# Patient Record
Sex: Female | Born: 1950 | Race: White | Hispanic: No | Smoking: Former smoker
Health system: Southern US, Community
[De-identification: ages and names within clinical notes are randomized; demographics above are authoritative.]

## PROBLEM LIST (undated history)

## (undated) DIAGNOSIS — F419 Anxiety disorder, unspecified: Secondary | ICD-10-CM

## (undated) DIAGNOSIS — R112 Nausea with vomiting, unspecified: Secondary | ICD-10-CM

## (undated) DIAGNOSIS — Z87442 Personal history of urinary calculi: Secondary | ICD-10-CM

## (undated) DIAGNOSIS — K219 Gastro-esophageal reflux disease without esophagitis: Secondary | ICD-10-CM

## (undated) DIAGNOSIS — T8859XA Other complications of anesthesia, initial encounter: Secondary | ICD-10-CM

## (undated) DIAGNOSIS — Z972 Presence of dental prosthetic device (complete) (partial): Secondary | ICD-10-CM

## (undated) DIAGNOSIS — Z9889 Other specified postprocedural states: Secondary | ICD-10-CM

## (undated) HISTORY — PX: HEEL SPUR SURGERY: SHX665

## (undated) SURGERY — Surgical Case
Anesthesia: *Unknown

---

## 2006-04-01 HISTORY — PX: ACHILLES TENDON REPAIR: SUR1153

## 2007-02-26 ENCOUNTER — Ambulatory Visit: Payer: Self-pay | Admitting: Family Medicine

## 2007-03-26 ENCOUNTER — Emergency Department: Payer: Self-pay | Admitting: Emergency Medicine

## 2007-08-04 ENCOUNTER — Ambulatory Visit: Payer: Self-pay

## 2007-08-28 ENCOUNTER — Ambulatory Visit: Payer: Self-pay | Admitting: Unknown Physician Specialty

## 2007-10-31 ENCOUNTER — Emergency Department: Payer: Self-pay | Admitting: Emergency Medicine

## 2008-04-01 HISTORY — PX: BREAST CYST ASPIRATION: SHX578

## 2008-07-11 ENCOUNTER — Emergency Department: Payer: Self-pay | Admitting: Emergency Medicine

## 2008-08-11 ENCOUNTER — Emergency Department: Payer: Self-pay

## 2012-08-27 ENCOUNTER — Emergency Department: Payer: Self-pay | Admitting: Emergency Medicine

## 2013-10-12 DIAGNOSIS — F324 Major depressive disorder, single episode, in partial remission: Secondary | ICD-10-CM | POA: Insufficient documentation

## 2013-10-12 DIAGNOSIS — F329 Major depressive disorder, single episode, unspecified: Secondary | ICD-10-CM | POA: Insufficient documentation

## 2013-10-12 DIAGNOSIS — K219 Gastro-esophageal reflux disease without esophagitis: Secondary | ICD-10-CM | POA: Insufficient documentation

## 2014-01-12 DIAGNOSIS — N219 Calculus of lower urinary tract, unspecified: Secondary | ICD-10-CM | POA: Insufficient documentation

## 2014-01-13 ENCOUNTER — Emergency Department: Payer: Self-pay | Admitting: Emergency Medicine

## 2014-01-13 LAB — COMPREHENSIVE METABOLIC PANEL
ALBUMIN: 3.4 g/dL (ref 3.4–5.0)
ALT: 30 U/L
Alkaline Phosphatase: 86 U/L
Anion Gap: 7 (ref 7–16)
BUN: 15 mg/dL (ref 7–18)
Bilirubin,Total: 0.5 mg/dL (ref 0.2–1.0)
CHLORIDE: 111 mmol/L — AB (ref 98–107)
CREATININE: 0.87 mg/dL (ref 0.60–1.30)
Calcium, Total: 8.1 mg/dL — ABNORMAL LOW (ref 8.5–10.1)
Co2: 25 mmol/L (ref 21–32)
EGFR (African American): >60
Glucose: 88 mg/dL (ref 65–99)
Osmolality: 285 (ref 275–301)
Potassium: 4.4 mmol/L (ref 3.5–5.1)
SGOT(AST): 33 U/L (ref 15–37)
Sodium: 143 mmol/L (ref 136–145)
Total Protein: 7.6 g/dL (ref 6.4–8.2)

## 2014-01-13 LAB — CBC
HCT: 39.9 % (ref 35.0–47.0)
HGB: 13 g/dL (ref 12.0–16.0)
MCH: 30 pg (ref 26.0–34.0)
MCHC: 32.6 g/dL (ref 32.0–36.0)
MCV: 92 fL (ref 80–100)
Platelet: 235 10*3/uL (ref 150–440)
RBC: 4.34 10*6/uL (ref 3.80–5.20)
RDW: 13.6 % (ref 11.5–14.5)
WBC: 4.7 10*3/uL (ref 3.6–11.0)

## 2014-01-13 LAB — URINALYSIS, COMPLETE
BILIRUBIN, UR: NEGATIVE
Bacteria: NONE SEEN
GLUCOSE, UR: NEGATIVE mg/dL (ref 0–75)
KETONE: NEGATIVE
Nitrite: NEGATIVE
PH: 5 (ref 4.5–8.0)
Protein: NEGATIVE
Specific Gravity: 1.019 (ref 1.003–1.030)

## 2014-01-15 LAB — URINE CULTURE

## 2014-08-31 ENCOUNTER — Other Ambulatory Visit: Payer: Self-pay | Admitting: Internal Medicine

## 2014-08-31 DIAGNOSIS — Z1231 Encounter for screening mammogram for malignant neoplasm of breast: Secondary | ICD-10-CM

## 2014-09-12 ENCOUNTER — Ambulatory Visit: Payer: Self-pay | Attending: Internal Medicine

## 2015-03-14 ENCOUNTER — Ambulatory Visit: Payer: Self-pay

## 2015-03-22 ENCOUNTER — Ambulatory Visit
Admission: RE | Admit: 2015-03-22 | Discharge: 2015-03-22 | Disposition: A | Discharge status: Home / Self Care | Payer: No Typology Code available for payment source | Source: Ambulatory Visit | Attending: Internal Medicine | Admitting: Internal Medicine

## 2015-03-22 DIAGNOSIS — Z1231 Encounter for screening mammogram for malignant neoplasm of breast: Secondary | ICD-10-CM | POA: Insufficient documentation

## 2016-02-15 ENCOUNTER — Other Ambulatory Visit: Payer: Self-pay | Admitting: Physician Assistant

## 2016-02-15 DIAGNOSIS — Z1231 Encounter for screening mammogram for malignant neoplasm of breast: Secondary | ICD-10-CM

## 2016-04-23 ENCOUNTER — Ambulatory Visit: Payer: No Typology Code available for payment source | Attending: Physician Assistant

## 2019-06-17 LAB — HM MAMMOGRAPHY

## 2019-08-09 ENCOUNTER — Emergency Department
Admission: EM | Admit: 2019-08-09 | Discharge: 2019-08-09 | Disposition: A | Discharge status: Home / Self Care | Payer: Medicare Other | Attending: Student in an Organized Health Care Education/Training Program | Admitting: Student in an Organized Health Care Education/Training Program

## 2019-08-09 ENCOUNTER — Other Ambulatory Visit: Payer: Self-pay

## 2019-08-09 ENCOUNTER — Emergency Department: Payer: Medicare Other

## 2019-08-09 ENCOUNTER — Encounter: Payer: Self-pay | Admitting: Emergency Medicine

## 2019-08-09 DIAGNOSIS — Z853 Personal history of malignant neoplasm of breast: Secondary | ICD-10-CM | POA: Insufficient documentation

## 2019-08-09 DIAGNOSIS — Z87891 Personal history of nicotine dependence: Secondary | ICD-10-CM | POA: Insufficient documentation

## 2019-08-09 DIAGNOSIS — R1011 Right upper quadrant pain: Secondary | ICD-10-CM

## 2019-08-09 DIAGNOSIS — R1032 Left lower quadrant pain: Secondary | ICD-10-CM | POA: Insufficient documentation

## 2019-08-09 DIAGNOSIS — R1031 Right lower quadrant pain: Secondary | ICD-10-CM | POA: Diagnosis not present

## 2019-08-09 DIAGNOSIS — R109 Unspecified abdominal pain: Secondary | ICD-10-CM | POA: Diagnosis present

## 2019-08-09 HISTORY — DX: Anxiety disorder, unspecified: F41.9

## 2019-08-09 HISTORY — DX: Gastro-esophageal reflux disease without esophagitis: K21.9

## 2019-08-09 LAB — COMPREHENSIVE METABOLIC PANEL
ALT: 27 U/L (ref 0–44)
AST: 34 U/L (ref 15–41)
Albumin: 4.1 g/dL (ref 3.5–5.0)
Alkaline Phosphatase: 52 U/L (ref 38–126)
Anion gap: 8 (ref 5–15)
BUN: 14 mg/dL (ref 8–23)
CO2: 25 mmol/L (ref 22–32)
Calcium: 9.1 mg/dL (ref 8.9–10.3)
Chloride: 109 mmol/L (ref 98–111)
Creatinine, Ser: 0.91 mg/dL (ref 0.44–1.00)
GFR calc Af Amer: >60 mL/min (ref >60)
GFR calc non Af Amer: >60 mL/min (ref >60)
Glucose, Bld: 101 mg/dL — ABNORMAL HIGH (ref 70–99)
Potassium: 4.2 mmol/L (ref 3.5–5.1)
Sodium: 142 mmol/L (ref 135–145)
Total Bilirubin: 0.6 mg/dL (ref 0.3–1.2)
Total Protein: 7.3 g/dL (ref 6.5–8.1)

## 2019-08-09 LAB — URINALYSIS, COMPLETE (UACMP) WITH MICROSCOPIC
Bilirubin Urine: NEGATIVE
Glucose, UA: NEGATIVE mg/dL
Hgb urine dipstick: NEGATIVE
Ketones, ur: NEGATIVE mg/dL
Nitrite: NEGATIVE
Protein, ur: NEGATIVE mg/dL
Specific Gravity, Urine: 1.024 (ref 1.005–1.030)
pH: 5 (ref 5.0–8.0)

## 2019-08-09 LAB — CBC
HCT: 37.5 % (ref 36.0–46.0)
Hemoglobin: 12.2 g/dL (ref 12.0–15.0)
MCH: 30.3 pg (ref 26.0–34.0)
MCHC: 32.5 g/dL (ref 30.0–36.0)
MCV: 93.1 fL (ref 80.0–100.0)
Platelets: 155 10*3/uL (ref 150–400)
RBC: 4.03 MIL/uL (ref 3.87–5.11)
RDW: 12.8 % (ref 11.5–15.5)
WBC: 4.5 10*3/uL (ref 4.0–10.5)
nRBC: 0 % (ref 0.0–0.2)

## 2019-08-09 LAB — LIPASE, BLOOD: Lipase: 28 U/L (ref 11–51)

## 2019-08-09 MED ORDER — ONDANSETRON HCL 4 MG/2ML IJ SOLN
4.0000 mg | Freq: Once | INTRAMUSCULAR | Status: AC
  Administered 2019-08-09: 4 mg via INTRAVENOUS
  Filled 2019-08-09: qty 2

## 2019-08-09 MED ORDER — IOHEXOL 300 MG/ML  SOLN
100.0000 mL | Freq: Once | INTRAMUSCULAR | Status: AC | PRN
  Administered 2019-08-09: 100 mL via INTRAVENOUS
  Filled 2019-08-09: qty 100

## 2019-08-09 MED ORDER — MORPHINE SULFATE (PF) 4 MG/ML IV SOLN
4.0000 mg | INTRAVENOUS | Status: DC | PRN
  Administered 2019-08-09: 4 mg via INTRAVENOUS
  Filled 2019-08-09: qty 1

## 2019-08-09 MED ORDER — ONDANSETRON HCL 4 MG PO TABS: 4.0000 mg | ORAL_TABLET | Freq: Every day | ORAL | 0 refills | Status: DC | PRN

## 2019-08-09 MED ORDER — OXYCODONE-ACETAMINOPHEN 5-325 MG PO TABS
1.0000 | ORAL_TABLET | ORAL | Status: DC | PRN
  Administered 2019-08-09: 1 via ORAL
  Filled 2019-08-09: qty 1

## 2019-08-09 MED ORDER — HYDROCODONE-ACETAMINOPHEN 5-325 MG PO TABS: 1.0000 | ORAL_TABLET | Freq: Four times a day (QID) | ORAL | 0 refills | Status: DC | PRN

## 2019-08-09 MED ORDER — POLYETHYLENE GLYCOL 3350 17 G PO PACK
17.0000 g | PACK | Freq: Every day | ORAL | 0 refills | Status: DC
Start: 2019-08-09 — End: 2019-10-05

## 2019-08-09 MED ORDER — SODIUM CHLORIDE 0.9 % IV BOLUS
500.0000 mL | Freq: Once | INTRAVENOUS | Status: AC
  Administered 2019-08-09: 500 mL via INTRAVENOUS

## 2019-08-09 NOTE — ED Notes (Signed)
Pt pending [place]

## 2019-08-09 NOTE — ED Triage Notes (Signed)
Pt here for right lower back/flank pain that radiates around to right lower abdomen.  Mild dysuria. Sx X 1 week. Has had diarrhea but no vomiting. No fevers.  Unlabored. VSS.

## 2019-08-09 NOTE — ED Notes (Signed)
Patient discharged to home per MD order. Patient in stable condition, and deemed medically cleared by ED provider for discharge. Discharge instructions reviewed with patient/family using "Teach Back"; verbalized understanding of medication education and administration, and information about follow-up care. Denies further concerns. ° °

## 2019-08-09 NOTE — ED Provider Notes (Signed)
-----------------------------------------   10:30 PM on 08/09/2019 -----------------------------------------  Ultrasound is normal.  Given normal LFTs with a reassuring ultrasound I believe the patient is safe for discharge home.  Patient agreeable to plan of care.   Harvest Dark, MD 08/09/19 2231

## 2019-08-09 NOTE — ED Provider Notes (Signed)
[facility] Emergency Department Provider Note    First MD Initiated Contact with Patient 08/09/19 2011     (approximate)  I have reviewed the triage vital signs and the nursing notes.   HISTORY  Chief Complaint Abdominal Pain    HPI Claudia Gutierrez is a 69 y.o. female with the below listed past medical history presents to the ER for 1 week of progressively worsening right-sided abdominal pain.  Has had some nausea and chills with this but no measured fevers.  Does still have her appendix.  States that pain initially started in the right flank is now wrapping around into the right groin.  Denies any dysuria.  Has never had pain like this before.  Does have a history of kidney stones but states that this pain has been more consistent.  Her father has a history of diverticulitis.  She has never been diagnosed with.    Past Medical History:  Diagnosis Date  . Acid reflux   . Anxiety    Family History  Problem Relation Age of Onset  . Breast cancer Sister 66       and stomach ca   Past Surgical History:  Procedure Laterality Date  . BREAST CYST ASPIRATION Right 2010   There are no problems to display for this patient.     Prior to Admission medications   Medication Sig Start Date End Date Taking? Authorizing Provider  HYDROcodone-acetaminophen (NORCO) 5-325 MG tablet Take 1 tablet by mouth every 6 (six) hours as needed for severe pain. 08/09/19   Merlyn Lot, MD  ondansetron (ZOFRAN) 4 MG tablet Take 1 tablet (4 mg total) by mouth daily as needed. 08/09/19 08/08/20  Merlyn Lot, MD  polyethylene glycol (MIRALAX / GLYCOLAX) 17 g packet Take 17 g by mouth daily. Mix one tablespoon with 8oz of your favorite juice or water every day until you are having soft formed stools. Then start taking once daily if you didn't have a stool the day before. 08/09/19   Merlyn Lot, MD    Allergies Codeine    Social History Social History    Tobacco Use  . Smoking status: Former Research scientist (life sciences)  . Smokeless tobacco: Never Used  Substance Use Topics  . Alcohol use: Yes  . Drug use: Never    Review of Systems Patient denies headaches, rhinorrhea, blurry vision, numbness, shortness of breath, chest pain, edema, cough, abdominal pain, nausea, vomiting, diarrhea, dysuria, fevers, rashes or hallucinations unless otherwise stated above in HPI. ____________________________________________   PHYSICAL EXAM:  VITAL SIGNS: Vitals:   08/09/19 1846 08/09/19 2043  BP:  (!) 152/79  Pulse:  65  Resp:  20  Temp:    SpO2: 94% 99%    Constitutional: Alert and oriented.  Eyes: Conjunctivae are normal.  Head: Atraumatic. Nose: No congestion/rhinnorhea. Mouth/Throat: Mucous membranes are moist.   Neck: No stridor. Painless ROM.  Cardiovascular: Normal rate, regular rhythm. Grossly normal heart sounds.  Good peripheral circulation. Respiratory: Normal respiratory effort.  No retractions. Lungs CTAB. Gastrointestinal: Soft with mild ttp in rlq, no guarding or rebound. No distention. No abdominal bruits. No CVA tenderness. Genitourinary:  Musculoskeletal: No lower extremity tenderness nor edema.  No joint effusions. Neurologic:  Normal speech and language. No gross focal neurologic deficits are appreciated. No facial droop Skin:  Skin is warm, dry and intact. No rash noted. Psychiatric: Mood and affect are normal. Speech and behavior are normal.  ____________________________________________   LABS (all labs ordered are listed, but  only abnormal results are displayed)  Results for orders placed or performed during the hospital encounter of 08/09/19 (from the past 24 hour(s))  Lipase, blood     Status: None   Collection Time: 08/09/19  6:44 PM  Result Value Ref Range   Lipase 28 11 - 51 U/L  Comprehensive metabolic panel     Status: Abnormal   Collection Time: 08/09/19  6:44 PM  Result Value Ref Range   Sodium 142 135 - 145 mmol/L    Potassium 4.2 3.5 - 5.1 mmol/L   Chloride 109 98 - 111 mmol/L   CO2 25 22 - 32 mmol/L   Glucose, Bld 101 (H) 70 - 99 mg/dL   BUN 14 8 - 23 mg/dL   Creatinine, Ser 0.91 0.44 - 1.00 mg/dL   Calcium 9.1 8.9 - 10.3 mg/dL   Total Protein 7.3 6.5 - 8.1 g/dL   Albumin 4.1 3.5 - 5.0 g/dL   AST 34 15 - 41 U/L   ALT 27 0 - 44 U/L   Alkaline Phosphatase 52 38 - 126 U/L   Total Bilirubin 0.6 0.3 - 1.2 mg/dL   GFR calc non Af Amer >60 >60 mL/min   GFR calc Af Amer >60 >60 mL/min   Anion gap 8 5 - 15  CBC     Status: None   Collection Time: 08/09/19  6:44 PM  Result Value Ref Range   WBC 4.5 4.0 - 10.5 K/uL   RBC 4.03 3.87 - 5.11 MIL/uL   Hemoglobin 12.2 12.0 - 15.0 g/dL   HCT 37.5 36.0 - 46.0 %   MCV 93.1 80.0 - 100.0 fL   MCH 30.3 26.0 - 34.0 pg   MCHC 32.5 30.0 - 36.0 g/dL   RDW 12.8 11.5 - 15.5 %   Platelets 155 150 - 400 K/uL   nRBC 0.0 0.0 - 0.2 %  Urinalysis, Complete w Microscopic     Status: Abnormal   Collection Time: 08/09/19  6:44 PM  Result Value Ref Range   Color, Urine YELLOW (A) YELLOW   APPearance CLOUDY (A) CLEAR   Specific Gravity, Urine 1.024 1.005 - 1.030   pH 5.0 5.0 - 8.0   Glucose, UA NEGATIVE NEGATIVE mg/dL   Hgb urine dipstick NEGATIVE NEGATIVE   Bilirubin Urine NEGATIVE NEGATIVE   Ketones, ur NEGATIVE NEGATIVE mg/dL   Protein, ur NEGATIVE NEGATIVE mg/dL   Nitrite NEGATIVE NEGATIVE   Leukocytes,Ua SMALL (A) NEGATIVE   RBC / HPF 0-5 0 - 5 RBC/hpf   WBC, UA 6-10 0 - 5 WBC/hpf   Bacteria, UA RARE (A) NONE SEEN   Squamous Epithelial / LPF 21-50 0 - 5   Mucus PRESENT    Hyaline Casts, UA PRESENT    ____________________________________________ ____________________________________________  RADIOLOGY  I personally reviewed all radiographic images ordered to evaluate for the above acute complaints and reviewed radiology reports and findings.  These findings were personally discussed with the patient.  Please see medical record for radiology  report.  ____________________________________________   PROCEDURES  Procedure(s) performed:  Procedures    Critical Care performed: no ____________________________________________   INITIAL IMPRESSION / ASSESSMENT AND PLAN / ED COURSE  Pertinent labs & imaging results that were available during my care of the patient were reviewed by me and considered in my medical decision making (see chart for details).   DDX: Appendicitis, diverticulitis, colitis, enteritis, cholelithiasis, pancreatitis, mass, SBO, musculoskeletal strain, shingles  ROONEY HARRALSON is a 69 y.o. who presents to the  ED with symptoms as described above.  Patient nontoxic-appearing but does have pain on exam of the abdomen no signs of shingles or overlying rash.  Does not have palpable hernia.  Buttock is reassuring without any evidence of transaminitis or biliary elevation.  No sign of leukocytosis.  Denies any dysuria.  Will order CT to evaluate for the above differential.  Clinical Course as of Aug 08 2125  Mon Aug 09, 2019  2123 Patient reassessed.  Discussed results of CT imaging with patient.  Does have some mild discomfort in the right upper quadrant though it seems predominantly in the right lower.  Will order ultrasound.  I anticipate patient will be appropriate for discharge home with pain medication and outpatient follow-up assuming that there is no evidence of acute cholecystitis as her blood work is otherwise reassuring.  No sign of pancreatic mass on CT no evidence of acute appendicitis or diverticulitis.   [PR]    Clinical Course User Index [PR] Merlyn Lot, MD    The patient was evaluated in Emergency Department today for the symptoms described in the history of present illness. He/she was evaluated in the context of the global COVID-19 pandemic, which necessitated consideration that the patient might be at risk for infection with the SARS-CoV-2 virus that causes COVID-19. Institutional protocols  and algorithms that pertain to the evaluation of patients at risk for COVID-19 are in a state of rapid change based on information released by regulatory bodies including the CDC and federal and state organizations. These policies and algorithms were followed during the patient's care in the ED.  As part of my medical decision making, I reviewed the following data within the [facility] notes reviewed and incorporated, Labs reviewed, notes from prior ED visits and [place] Controlled Substance Database   ____________________________________________   FINAL CLINICAL IMPRESSION(S) / ED DIAGNOSES  Final diagnoses:  RUQ abdominal pain  Right lower quadrant abdominal pain      NEW MEDICATIONS STARTED DURING THIS VISIT:  New Prescriptions   HYDROCODONE-ACETAMINOPHEN (NORCO) 5-325 MG TABLET    Take 1 tablet by mouth every 6 (six) hours as needed for severe pain.   ONDANSETRON (ZOFRAN) 4 MG TABLET    Take 1 tablet (4 mg total) by mouth daily as needed.   POLYETHYLENE GLYCOL (MIRALAX / GLYCOLAX) 17 G PACKET    Take 17 g by mouth daily. Mix one tablespoon with 8oz of your favorite juice or water every day until you are having soft formed stools. Then start taking once daily if you didn't have a stool the day before.     Note:  This document was prepared using Dragon voice recognition software and may include unintentional dictation errors.    Merlyn Lot, MD 08/09/19 2128

## 2019-08-09 NOTE — Discharge Instructions (Addendum)

## 2019-08-11 ENCOUNTER — Encounter: Payer: Self-pay | Admitting: Gastroenterology

## 2019-08-11 ENCOUNTER — Other Ambulatory Visit: Payer: Self-pay

## 2019-08-11 ENCOUNTER — Ambulatory Visit (INDEPENDENT_AMBULATORY_CARE_PROVIDER_SITE_OTHER): Payer: Medicare Other | Admitting: Gastroenterology

## 2019-08-11 VITALS — BP 143/67 | HR 62 | Temp 97.8°F | Ht 65.0 in | Wt 155.0 lb

## 2019-08-11 DIAGNOSIS — R933 Abnormal findings on diagnostic imaging of other parts of digestive tract: Secondary | ICD-10-CM

## 2019-08-11 DIAGNOSIS — R197 Diarrhea, unspecified: Secondary | ICD-10-CM

## 2019-08-11 DIAGNOSIS — M545 Low back pain, unspecified: Secondary | ICD-10-CM

## 2019-08-11 DIAGNOSIS — R935 Abnormal findings on diagnostic imaging of other abdominal regions, including retroperitoneum: Secondary | ICD-10-CM | POA: Diagnosis not present

## 2019-08-11 NOTE — Progress Notes (Signed)
Jonathon Bellows MD, MRCP(U.K) [address]  [place]  [place], [place] [address]  Main: [phone]  Fax: [phone]   Gastroenterology Consultation  Referring Provider:     [facility] Physician:  [facility] Primary Gastroenterologist:  Dr. Jonathon Bellows  Reason for Consultation:     ED follow-up        HPI:   Claudia Gutierrez is a 68 y.o. y/o female here to see me for an ED follow-up.  I reviewed records and it suggest that the patient already has an established gastroenterologist at [facility].  Last seen in October 2020.  Carries a diagnosis of IBS diarrhea.  Been established with them since November 2018.  History of GERD and intermittent abdominal pain.  EGD and colonoscopy in January 2019 were normal but showed a hiatal hernia..  Esophageal and gastric biopsies were normal.  At her last visit with her gastroenterologist had intermittent diarrhea and bloating for over a year.  She has been taking BC powders once daily for headaches.  Has had a gain in weight. At that time she was given a trial of Xifaxan for 14 days.  On 08/09/2019 presented to the emergency room at [facility] with abdominal pain.  Ongoing for over a week.  She has been on hydrocodone.She underwent a CT scan of the abdomen which demonstrated mild prominence of the common bile duct and proximal pancreatic duct.Subsequently a right upper quadrant ultrasound demonstrated a common bile duct diameter of 5.6 mm.  CMP showed no abnormalities in her transaminases or total bilirubin.  Hemoglobin is 12.2 g with an MCV of 93.1.  Urine analysis showed rare bacteria.  She states that she has been having back pain ongoing for a few weeks.  Points to the lower back on the right side.  Radiates to the front of her abdomen particularly to the groin.  Worse when she moves and better when she lays still.  Hydrocodone has been helping it.  Certain positions make it worse.   Has been taking ibuprofen daily for a short period of time.  She says be been having diarrhea for the past 2 weeks.  Occasional blood in the stool.  Past Medical History:  Diagnosis Date  . Acid reflux   . Anxiety     Past Surgical History:  Procedure Laterality Date  . BREAST CYST ASPIRATION Right 2010    Prior to Admission medications   Medication Sig Start Date End Date Taking? Authorizing Provider  HYDROcodone-acetaminophen (NORCO) 5-325 MG tablet Take 1 tablet by mouth every 6 (six) hours as needed for severe pain. 08/09/19   Merlyn Lot, MD  ondansetron (ZOFRAN) 4 MG tablet Take 1 tablet (4 mg total) by mouth daily as needed. 08/09/19 08/08/20  Merlyn Lot, MD  polyethylene glycol (MIRALAX / GLYCOLAX) 17 g packet Take 17 g by mouth daily. Mix one tablespoon with 8oz of your favorite juice or water every day until you are having soft formed stools. Then start taking once daily if you didn't have a stool the day before. 08/09/19   Merlyn Lot, MD    Family History  Problem Relation Age of Onset  . Breast cancer Sister 9       and stomach ca     Social History   Tobacco Use  . Smoking status: Former Research scientist (life sciences)  . Smokeless tobacco: Never Used  Substance Use Topics  . Alcohol use: Yes  . Drug use:  Never    Allergies as of 08/11/2019 - Review Complete 08/09/2019  Allergen Reaction Noted  . Codeine Nausea And Vomiting 08/09/2019    Review of Systems:    All systems reviewed and negative except where noted in HPI.   Physical Exam:  There were no vitals taken for this visit. No LMP recorded. Patient is postmenopausal. Psych:  Alert and cooperative. Normal mood and affect. General:   Alert,  Well-developed, well-nourished, pleasant and cooperative in NAD Head:  Normocephalic and atraumatic. Eyes:  Sclera clear, no icterus.   Conjunctiva pink. Ears:  Normal auditory acuity. Lungs:  Respirations even and unlabored.  Clear throughout to auscultation.   No  wheezes, crackles, or rhonchi. No acute distress. Heart:  Regular rate and rhythm; no murmurs, clicks, rubs, or gallops. Abdomen:  Normal bowel sounds.  No bruits.  Soft, non-tender and non-distended without masses, hepatosplenomegaly or hernias noted.  No guarding or rebound tenderness.    Msk: Reproducible tenderness in the right lower paraspinal muscle area.  Tenderness of the right iliac crest in the posterior aspect. Neurologic:  Alert and oriented x3;  grossly normal neurologically. Skin:  Intact without significant lesions or rashes. No jaundice. Lymph Nodes:  No significant cervical adenopathy. Psych:  Alert and cooperative. Normal mood and affect.  Imaging Studies: CT ABDOMEN PELVIS W CONTRAST  Result Date: 08/09/2019 CLINICAL DATA:  Right lower quadrant abdominal pain EXAM: CT ABDOMEN AND PELVIS WITH CONTRAST TECHNIQUE: Multidetector CT imaging of the abdomen and pelvis was performed using the standard protocol following bolus administration of intravenous contrast. CONTRAST:  149mL OMNIPAQUE IOHEXOL 300 MG/ML  SOLN COMPARISON:  None. FINDINGS: Lower chest: The visualized heart size within normal limits. No pericardial fluid/thickening. No hiatal hernia. The visualized portions of the lungs are clear. Hepatobiliary: The liver is normal in density without focal abnormality.The main portal vein is patent. No calcified gallstones are seen. There does however appear to be mild prominence of the common bile duct measuring up to 7 mm and the proximal pancreatic duct measuring up to 4 mm in dimension. Pancreas: Tiny calcifications are seen within or adjacent to the pancreatic head and midbody. No definite surrounding fat stranding changes are noted. Spleen: Normal in size without focal abnormality. Adrenals/Urinary Tract: Both adrenal glands appear normal. The kidneys and collecting system appear normal without evidence of urinary tract calculus or hydronephrosis. The bladder is partially decompressed  with apparent mild wall thickening. Stomach/Bowel: The stomach, small bowel, and colon are normal in appearance. No inflammatory changes, wall thickening, or obstructive findings.Scattered colonic diverticula are seen without diverticulitis. The appendix is unremarkable. Vascular/Lymphatic: There are no enlarged mesenteric, retroperitoneal, or pelvic lymph nodes. Scattered mild aortic atherosclerosis is noted. Reproductive: The uterus and adnexa are unremarkable. Other: Small fat containing anterior umbilical hernia noted. Musculoskeletal: No acute or significant osseous findings. There is a chronic slight anterior wedge compression deformity of the T11 vertebral body with endplate irregularity noted. IMPRESSION: 1. No cholelithiasis, however there is mild prominence of the common bile duct and proximal pancreatic duct which is nonspecific. 2. Normal appendix 3. Mild aortic Atherosclerosis (ICD10-I70.0). Electronically Signed   By: Prudencio Pair M.D.   On: 08/09/2019 21:10   US ABDOMEN LIMITED RUQ  Result Date: 08/09/2019 CLINICAL DATA:  Right upper quadrant abdominal pain x1 week EXAM: ULTRASOUND ABDOMEN LIMITED RIGHT UPPER QUADRANT COMPARISON:  CT from same day FINDINGS: Gallbladder: No gallstones or wall thickening visualized. No sonographic Murphy sign noted by sonographer. Common bile duct: Diameter: 5.6 mm  Liver: No focal lesion identified. Within normal limits in parenchymal echogenicity. Portal vein is patent on color Doppler imaging with normal direction of blood flow towards the liver. Other: None. IMPRESSION: Normal study. If there is clinical concern for an obstructing process based off the patient's recent CT and laboratory studies, consider further evaluation with MRCP. Electronically Signed   By: Constance Holster M.D.   On: 08/09/2019 22:25    Assessment and Plan:   ATIYA ILIFF is a 69 y.o. y/o female here to see me as an ER follow-up.  Carries a prior diagnosis of IBS-D and used to be  managed at [facility].  He had to transfer care to [place].  Presently she has back pain which based on her history and examination suggestive of musculoskeletal pain.  I suggested she be seen by physical therapy.  She is due to set up an appointment with her primary care physician under [facility] health.  I suggested her to avoid excess ibuprofen and substituted with Tylenol.  I do not believe she has abdominal pain.  This is a back pain.  She also has by diarrhea ongoing for over 2 weeks.  I will rule out infection with stool tests and fecal calprotectin, celiac serology.  If negative will need a colonoscopy to rule out microscopic colitis which has not been ruled out in the past.  I will also order an MRCP as she had biliary and pancreatic ductal dilation on her last CT scan.  No abnormal LFTs.  I will see her back in 2 weeks.  Follow up in 2 to 3 weeks.  Dr Jonathon Bellows MD,MRCP(U.K)

## 2019-08-13 ENCOUNTER — Encounter: Payer: Self-pay | Admitting: Gastroenterology

## 2019-08-13 LAB — CELIAC DISEASE AB SCREEN W/RFX
Antigliadin Abs, IgA: 5 units (ref 0–19)
IgA/Immunoglobulin A, Serum: 141 mg/dL (ref 87–352)
Transglutaminase IgA: <2 U/mL (ref 0–3)

## 2019-08-17 LAB — GI PROFILE, STOOL, PCR

## 2019-08-17 LAB — C DIFFICILE, CYTOTOXIN B

## 2019-08-17 LAB — CALPROTECTIN, FECAL: Calprotectin, Fecal: 57 ug/g (ref 0–120)

## 2019-08-17 LAB — C DIFFICILE TOXINS A+B W/RFLX: C difficile Toxins A+B, EIA: NEGATIVE

## 2019-08-24 ENCOUNTER — Other Ambulatory Visit: Payer: Self-pay

## 2019-08-24 ENCOUNTER — Telehealth: Payer: Self-pay

## 2019-08-24 ENCOUNTER — Telehealth: Payer: Self-pay | Admitting: Gastroenterology

## 2019-08-24 DIAGNOSIS — R197 Diarrhea, unspecified: Secondary | ICD-10-CM

## 2019-08-24 MED ORDER — NA SULFATE-K SULFATE-MG SULF 17.5-3.13-1.6 GM/177ML PO SOLN
354.0000 mL | Freq: Once | ORAL | 0 refills | Status: AC
Start: 2019-08-24 — End: 2019-08-24

## 2019-08-24 MED ORDER — ALPRAZOLAM 0.5 MG PO TABS: ORAL_TABLET | ORAL | 0 refills | Status: DC

## 2019-08-24 NOTE — Telephone Encounter (Signed)
I do not prescribe hydrocodone fro musculoskeletal pains. Would need to contact pcp if needed or pain management

## 2019-08-24 NOTE — Telephone Encounter (Signed)
Patient verbalized understanding  

## 2019-08-24 NOTE — Telephone Encounter (Signed)
-----   Message from Jonathon Bellows, MD sent at 08/23/2019  2:32 PM EDT ----- Inform stool tests are completely negative for infection and inflammation.  If still having diarrhea should proceed with colonoscopy.

## 2019-08-24 NOTE — Telephone Encounter (Signed)
Hydrocodone  [facility] [place]

## 2019-08-24 NOTE — Telephone Encounter (Signed)
Patient verbalized understanding. Scheduled patient for colonoscopy on 09/07/2019. Mailed instructions

## 2019-08-24 NOTE — Telephone Encounter (Signed)
Patient states the hospital gave her hydrocodone for the RUQ pain. Patient would like a refill on it

## 2019-08-26 ENCOUNTER — Other Ambulatory Visit: Payer: Self-pay

## 2019-08-26 ENCOUNTER — Other Ambulatory Visit: Payer: Self-pay | Admitting: Gastroenterology

## 2019-08-26 ENCOUNTER — Ambulatory Visit
Admission: RE | Admit: 2019-08-26 | Discharge: 2019-08-26 | Disposition: A | Discharge status: Home / Self Care | Payer: Medicare Other | Source: Ambulatory Visit | Attending: Gastroenterology | Admitting: Gastroenterology

## 2019-08-26 DIAGNOSIS — R933 Abnormal findings on diagnostic imaging of other parts of digestive tract: Secondary | ICD-10-CM

## 2019-08-26 MED ORDER — GADOBUTROL 1 MMOL/ML IV SOLN
7.0000 mL | Freq: Once | INTRAVENOUS | Status: AC | PRN
  Administered 2019-08-26: 7 mL via INTRAVENOUS

## 2019-09-02 ENCOUNTER — Telehealth (INDEPENDENT_AMBULATORY_CARE_PROVIDER_SITE_OTHER): Payer: Medicare Other | Admitting: Gastroenterology

## 2019-09-02 ENCOUNTER — Telehealth: Payer: Self-pay

## 2019-09-02 DIAGNOSIS — R935 Abnormal findings on diagnostic imaging of other abdominal regions, including retroperitoneum: Secondary | ICD-10-CM | POA: Diagnosis not present

## 2019-09-02 DIAGNOSIS — R197 Diarrhea, unspecified: Secondary | ICD-10-CM | POA: Diagnosis not present

## 2019-09-02 NOTE — Progress Notes (Signed)
Claudia Gutierrez , MD [address]  [place], [place] [address]  Main: [phone]  Fax: [phone]   Primary Care Physician: [facility]  Virtual Visit via Video Note  I connected with patient on 09/02/19 at 11:00 AM EDT by video and verified that I am speaking with the correct person using two identifiers.   I discussed the limitations, risks, security and privacy concerns of performing an evaluation and management service by video  and the availability of in person appointments. I also discussed with the patient that there may be a patient responsible charge related to this service. The patient expressed understanding and agreed to proceed.  Location of Patient: Home Location of Provider: Home Persons involved: Patient and provider only   History of Present Illness:  Discuss results of recent MRI- planned video visit but later changed to telephone visit   HPI: Claudia Gutierrez is a 69 y.o. female  Summary of history :  Initially referred and seen back in 07/2019 for diarrhea. She has previously been established at [place] since November 2018.Marland Kitchen Last seen in October 2020.  Carries a diagnosis of IBS diarrhea,GERD and intermittent abdominal pain.  History of  EGD and colonoscopy in January 2019 were normal but showed a hiatal hernia..  Esophageal and gastric biopsies normal.  At her last visit with her gastroenterologist had intermittent diarrhea and bloating for over a year.  She has been taking BC powders once daily for headaches.  Has had a gain in weight.At that time she was given a trial of Xifaxan for 14 days.  08/09/2019 presented to the emergency room at [facility] with abdominal pain.  for over a week.  She has been on hydrocodone.She underwent a CT scan of the abdomen which demonstrated mild prominence of the common bile duct and proximal pancreatic duct.Subsequently a right upper quadrant ultrasound demonstrated a  common bile duct diameter of 5.6 mm.  CMP showed no abnormalities in her transaminases or total bilirubin.  Hemoglobin is 12.2 g with an MCV of 93.1.  Urine analysis showed rare bacteria.  She states that she has been having back pain ongoing for a few weeks.  Points to the lower back on the right side.  Radiates to the front of her abdomen particularly to the groin.  Worse when she moves and better when she lays still.  Hydrocodone has been helping it.  Certain positions make it worse.  Has been taking ibuprofen daily for a short period of time.  She says be been having diarrhea for the past 2 weeks.  Occasional blood in the stool.  Interval history  08/11/2019-09/02/2019  08/12/2019: C diff toxin , GI PCR, celiac serology and fecal calprotectin- negative 08/26/2019: MRCP: Cystic lesion near the neck of the pancreas with question of dependent nodule versus small amount of dependently layering hemorrhagic or proteinaceous material.  Mild pancreatic ductal distension in the head of the pancreas.This is not as pronounced as suggested on the CT evaluation.Findings could be related to prior inflammation but appear improved. Adenomyomatosis of the gallbladder fundus.   Discussed with Dr Rush Landmark and felt that EUS would be beneficial to evaluate the cystic lesion.  Still has some diarrhea and back pain but not as bad, stopped nsaid use   Current Outpatient Medications  Medication Sig Dispense Refill  . ALPRAZolam (XANAX) 0.5 MG tablet Take 0.5mg  60 mins prior to MRI 1 tablet 0  . citalopram (CELEXA) 20  MG tablet Take 1 tablet by mouth daily.    Marland Kitchen HYDROcodone-acetaminophen (NORCO) 5-325 MG tablet Take 1 tablet by mouth every 6 (six) hours as needed for severe pain. 6 tablet 0  . omeprazole (PRILOSEC) 20 MG capsule Take 20 mg by mouth daily.    . ondansetron (ZOFRAN) 4 MG tablet Take 1 tablet (4 mg total) by mouth daily as needed. 14 tablet 0  . polyethylene glycol (MIRALAX / GLYCOLAX) 17 g packet Take 17  g by mouth daily. Mix one tablespoon with 8oz of your favorite juice or water every day until you are having soft formed stools. Then start taking once daily if you didn't have a stool the day before. 30 each 0  . traZODone (DESYREL) 100 MG tablet Take 1 tablet by mouth daily.     No current facility-administered medications for this visit.    Allergies as of 09/02/2019 - Review Complete 08/11/2019  Allergen Reaction Noted  . Hydrocodone-acetaminophen Nausea Only and Nausea And Vomiting 08/11/2019  . Paroxetine hcl Other (See Comments) and Nausea Only 08/11/2019  . Azithromycin  03/05/2013  . Codeine Nausea And Vomiting 08/09/2019  . Cyclobenzaprine  03/05/2013    Review of Systems:    All systems reviewed and negative except where noted in HPI.  General Appearance:    Alert, cooperative, no distress, appears stated age  Head:    Normocephalic, without obvious abnormality, atraumatic  Eyes:    PERRL, conjunctiva/corneas clear,  Ears:    Grossly normal hearing    Neurologic:  Grossly normal    Observations/Objective:  Labs: CMP     Component Value Date/Time   NA 142 08/09/2019 1844   NA 143 01/13/2014 1630   K 4.2 08/09/2019 1844   K 4.4 01/13/2014 1630   CL 109 08/09/2019 1844   CL 111 (H) 01/13/2014 1630   CO2 25 08/09/2019 1844   CO2 25 01/13/2014 1630   GLUCOSE 101 (H) 08/09/2019 1844   GLUCOSE 88 01/13/2014 1630   BUN 14 08/09/2019 1844   BUN 15 01/13/2014 1630   CREATININE 0.91 08/09/2019 1844   CREATININE 0.87 01/13/2014 1630   CALCIUM 9.1 08/09/2019 1844   CALCIUM 8.1 (L) 01/13/2014 1630   PROT 7.3 08/09/2019 1844   PROT 7.6 01/13/2014 1630   ALBUMIN 4.1 08/09/2019 1844   ALBUMIN 3.4 01/13/2014 1630   AST 34 08/09/2019 1844   AST 33 01/13/2014 1630   ALT 27 08/09/2019 1844   ALT 30 01/13/2014 1630   ALKPHOS 52 08/09/2019 1844   ALKPHOS 86 01/13/2014 1630   BILITOT 0.6 08/09/2019 1844   BILITOT 0.5 01/13/2014 1630   GFRNONAA >60 08/09/2019 1844    GFRNONAA >60 01/13/2014 1630   GFRAA >60 08/09/2019 1844   GFRAA >60 01/13/2014 1630   Lab Results  Component Value Date   WBC 4.5 08/09/2019   HGB 12.2 08/09/2019   HCT 37.5 08/09/2019   MCV 93.1 08/09/2019   PLT 155 08/09/2019    Imaging Studies: CT ABDOMEN PELVIS W CONTRAST  Result Date: 08/09/2019 CLINICAL DATA:  Right lower quadrant abdominal pain EXAM: CT ABDOMEN AND PELVIS WITH CONTRAST TECHNIQUE: Multidetector CT imaging of the abdomen and pelvis was performed using the standard protocol following bolus administration of intravenous contrast. CONTRAST:  142mL OMNIPAQUE IOHEXOL 300 MG/ML  SOLN COMPARISON:  None. FINDINGS: Lower chest: The visualized heart size within normal limits. No pericardial fluid/thickening. No hiatal hernia. The visualized portions of the lungs are clear. Hepatobiliary: The liver is normal  in density without focal abnormality.The main portal vein is patent. No calcified gallstones are seen. There does however appear to be mild prominence of the common bile duct measuring up to 7 mm and the proximal pancreatic duct measuring up to 4 mm in dimension. Pancreas: Tiny calcifications are seen within or adjacent to the pancreatic head and midbody. No definite surrounding fat stranding changes are noted. Spleen: Normal in size without focal abnormality. Adrenals/Urinary Tract: Both adrenal glands appear normal. The kidneys and collecting system appear normal without evidence of urinary tract calculus or hydronephrosis. The bladder is partially decompressed with apparent mild wall thickening. Stomach/Bowel: The stomach, small bowel, and colon are normal in appearance. No inflammatory changes, wall thickening, or obstructive findings.Scattered colonic diverticula are seen without diverticulitis. The appendix is unremarkable. Vascular/Lymphatic: There are no enlarged mesenteric, retroperitoneal, or pelvic lymph nodes. Scattered mild aortic atherosclerosis is noted. Reproductive:  The uterus and adnexa are unremarkable. Other: Small fat containing anterior umbilical hernia noted. Musculoskeletal: No acute or significant osseous findings. There is a chronic slight anterior wedge compression deformity of the T11 vertebral body with endplate irregularity noted. IMPRESSION: 1. No cholelithiasis, however there is mild prominence of the common bile duct and proximal pancreatic duct which is nonspecific. 2. Normal appendix 3. Mild aortic Atherosclerosis (ICD10-I70.0). Electronically Signed   By: Prudencio Pair M.D.   On: 08/09/2019 21:10   MR 3D Recon At Scanner  Result Date: 08/26/2019 CLINICAL DATA:  Mild prominence of common bile duct and pancreatic duct. EXAM: MRI ABDOMEN WITHOUT AND WITH CONTRAST (INCLUDING MRCP) TECHNIQUE: Multiplanar multisequence MR imaging of the abdomen was performed both before and after the administration of intravenous contrast. Heavily T2-weighted images of the biliary and pancreatic ducts were obtained, and three-dimensional MRCP images were rendered by post processing. CONTRAST:  68mL GADAVIST GADOBUTROL 1 MMOL/ML IV SOLN COMPARISON:  None. FINDINGS: Lower chest: No consolidation.  No pleural effusion. Hepatobiliary: Mild ductal distension of the common bile duct. No focal hepatic lesion. Adenomyomatosis of the gallbladder fundus. Low position of the biliary confluence adjacent to the cystic duct confluence. RIGHT joins LEFT biliary ducts just above the cystic duct confluence. Pancreas: Mild pancreatic ductal distension in the head of the pancreas. This is not as pronounced as suggested on the CT. It appears less pronounced than on the CT evaluation. Cystic lesion in the neck of the pancreas measuring approximately 12 x 10 mm, not associated with the area of pancreatic ductal dilation, adjacent to nondilated duct in the neck of the pancreas. Associated nodular area within the lumen and 3 additional subcentimeter cysts in the head of the pancreas. Potential nodular  area in the lumen is seen on the axial T2 (image 15, series 4) dependent aspect of this area at could show potential enhancement though given small size this areas difficult to assess note that on precontrast images there is mild intrinsic T1 hyperintensity and this area is hypointense on T2. Spleen:  Normal appearance of the spleen. Adrenals/Urinary Tract: Adrenal glands are normal. Symmetric enhancement the kidneys. Stomach/Bowel: Normal to the extent evaluated. Vascular/Lymphatic:  No adenopathy.  Patent abdominal vasculature. Other:  No ascites. Musculoskeletal: No suspicious bone lesions identified. IMPRESSION: 1. 2. Cystic lesion near the neck of the pancreas with question of dependent nodule versus small amount of dependently layering hemorrhagic or proteinaceous material. The possibility of enhancement of this area is difficult to exclude entirely. Endoscopic evaluation may be helpful for further evaluation to exclude the presence of nodule within a cystic lesion/small  pancreatic neoplasm. (Image 37 of series 18 is the best illustrations on post-contrast imaging of this potential area of concern though again averaging of adjacent pancreatic parenchyma is possible.) Given this possibility short interval follow-up with MRI/MRCP in 3 months could be considered as well. 3. Mild pancreatic ductal distension in the head of the pancreas. This is not as pronounced as suggested on the CT evaluation. Findings could be related to prior inflammation but appear improved. 4. Adenomyomatosis of the gallbladder fundus. 5. Low position of the biliary confluence adjacent to the cystic duct confluence. RIGHT joins LEFT biliary ducts just above the cystic duct confluence. Attention to this anatomy would be important if cholecystectomy is ever performed. Electronically Signed   By: Zetta Bills M.D.   On: 08/26/2019 17:57   MR ABDOMEN MRCP W WO CONTAST  Result Date: 08/26/2019 CLINICAL DATA:  Mild prominence of common bile  duct and pancreatic duct. EXAM: MRI ABDOMEN WITHOUT AND WITH CONTRAST (INCLUDING MRCP) TECHNIQUE: Multiplanar multisequence MR imaging of the abdomen was performed both before and after the administration of intravenous contrast. Heavily T2-weighted images of the biliary and pancreatic ducts were obtained, and three-dimensional MRCP images were rendered by post processing. CONTRAST:  65mL GADAVIST GADOBUTROL 1 MMOL/ML IV SOLN COMPARISON:  None. FINDINGS: Lower chest: No consolidation.  No pleural effusion. Hepatobiliary: Mild ductal distension of the common bile duct. No focal hepatic lesion. Adenomyomatosis of the gallbladder fundus. Low position of the biliary confluence adjacent to the cystic duct confluence. RIGHT joins LEFT biliary ducts just above the cystic duct confluence. Pancreas: Mild pancreatic ductal distension in the head of the pancreas. This is not as pronounced as suggested on the CT. It appears less pronounced than on the CT evaluation. Cystic lesion in the neck of the pancreas measuring approximately 12 x 10 mm, not associated with the area of pancreatic ductal dilation, adjacent to nondilated duct in the neck of the pancreas. Associated nodular area within the lumen and 3 additional subcentimeter cysts in the head of the pancreas. Potential nodular area in the lumen is seen on the axial T2 (image 15, series 4) dependent aspect of this area at could show potential enhancement though given small size this areas difficult to assess note that on precontrast images there is mild intrinsic T1 hyperintensity and this area is hypointense on T2. Spleen:  Normal appearance of the spleen. Adrenals/Urinary Tract: Adrenal glands are normal. Symmetric enhancement the kidneys. Stomach/Bowel: Normal to the extent evaluated. Vascular/Lymphatic:  No adenopathy.  Patent abdominal vasculature. Other:  No ascites. Musculoskeletal: No suspicious bone lesions identified. IMPRESSION: 1. 2. Cystic lesion near the neck of  the pancreas with question of dependent nodule versus small amount of dependently layering hemorrhagic or proteinaceous material. The possibility of enhancement of this area is difficult to exclude entirely. Endoscopic evaluation may be helpful for further evaluation to exclude the presence of nodule within a cystic lesion/small pancreatic neoplasm. (Image 37 of series 18 is the best illustrations on post-contrast imaging of this potential area of concern though again averaging of adjacent pancreatic parenchyma is possible.) Given this possibility short interval follow-up with MRI/MRCP in 3 months could be considered as well. 3. Mild pancreatic ductal distension in the head of the pancreas. This is not as pronounced as suggested on the CT evaluation. Findings could be related to prior inflammation but appear improved. 4. Adenomyomatosis of the gallbladder fundus. 5. Low position of the biliary confluence adjacent to the cystic duct confluence. RIGHT joins LEFT biliary ducts  just above the cystic duct confluence. Attention to this anatomy would be important if cholecystectomy is ever performed. Electronically Signed   By: Zetta Bills M.D.   On: 08/26/2019 17:57   US ABDOMEN LIMITED RUQ  Result Date: 08/09/2019 CLINICAL DATA:  Right upper quadrant abdominal pain x1 week EXAM: ULTRASOUND ABDOMEN LIMITED RIGHT UPPER QUADRANT COMPARISON:  CT from same day FINDINGS: Gallbladder: No gallstones or wall thickening visualized. No sonographic Murphy sign noted by sonographer. Common bile duct: Diameter: 5.6 mm Liver: No focal lesion identified. Within normal limits in parenchymal echogenicity. Portal vein is patent on color Doppler imaging with normal direction of blood flow towards the liver. Other: None. IMPRESSION: Normal study. If there is clinical concern for an obstructing process based off the patient's recent CT and laboratory studies, consider further evaluation with MRCP. Electronically Signed   By:  Constance Holster M.D.   On: 08/09/2019 22:25    Assessment and Plan:   Claudia Gutierrez is a 69 y.o. y/o female  here to see me to discuss MRI results. .  Carries a prior diagnosis of IBS-D and used to be managed at [facility].  She had to transfer care to [place].  Back pain felt previously musculoskeletal. 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 Discussed with Dr Rush Landmark and recommended EUS. Patient agrees to proceed.  No abnormal LFTs.  I will see her back in 2 weeks.  Plan   1. Refer to Dr Mansouraty/Dr Ardis Hughs for EUS 2. Proceed with colonoscopy at [facility] as scheduled.  3. Next visit consider HIDA scan if right sided pain persists  I discussed the assessment and treatment plan with the patient. The patient was provided an opportunity to ask questions and all were answered. The patient agreed with the plan and demonstrated an understanding of the instructions.   The patient was advised to call back or seek an in-person evaluation if the symptoms worsen or if the condition fails to improve as anticipated.  I provided 25 minutes of non  face-to-face time during this encounter.  Dr Claudia Bellows MD,MRCP [facility]) Gastroenterology/Hepatology Pager: [phone]   Speech recognition software was used to dictate this note.

## 2019-09-02 NOTE — Telephone Encounter (Signed)
-----   Message from Irving Copas., MD sent at 09/01/2019 11:06 AM EDT ----- Regarding: RE: advise Kiran,Interesting case and imaging findings.No doubt that the CT scan showed a much more significant PD dilation of the ventral duct but the MRI is improved.If we only had the cystic region I would normally say just follow-up with the MRI but with this question of some nodularity I think an EUS would not be unreasonable.We would be able to do a nude endoscopic evaluation as well to ensure there is nothing else that is going on.KA, if you are okay with letting the patient know that we recommend an EGD/EUS (radial/linear 60-minute) then DJ or myself will be happy to get that completed in the coming 6 to 8 weeks.The colonoscopy would not be able to be performed at the same time unfortunately just due to however time constraints.Please reply once you have spoken with the patient.Thanks.[place] awaiting Dr. Vicente Males has response.FYI DJ about EUS ----- Message ----- From: Jonathon Bellows, MD Sent: 08/31/2019   9:48 AM EDT To: Irving Copas., MD Subject: advise                                         Good morning hope you are doing wellCan you look at this patient's MRI and let me know if you would prefer me to get a follow-up MRI or an EUS for the nodule in the pancreas.  There is some pancreatic ductal dilation.  Get me know your thoughts and I will proceed.  RegardsKiran

## 2019-09-02 NOTE — Addendum Note (Signed)
Addended by: Dorethea Clan on: 09/02/2019 11:24 AM   Modules accepted: Orders

## 2019-09-02 NOTE — Telephone Encounter (Signed)
Milus Banister, MD  Jonathon Bellows, MD; Timothy Lasso, RN  Thanks, we'll get in touch   Lequan Dobratz,  See yesterday's note. Ok to contact her to schedule. Thanks

## 2019-09-03 ENCOUNTER — Other Ambulatory Visit: Payer: Self-pay

## 2019-09-03 ENCOUNTER — Other Ambulatory Visit
Admission: RE | Admit: 2019-09-03 | Discharge: 2019-09-03 | Disposition: A | Discharge status: Home / Self Care | Payer: Medicare Other | Source: Ambulatory Visit | Attending: Gastroenterology | Admitting: Gastroenterology

## 2019-09-03 DIAGNOSIS — Z20822 Contact with and (suspected) exposure to covid-19: Secondary | ICD-10-CM | POA: Diagnosis not present

## 2019-09-03 DIAGNOSIS — Z01812 Encounter for preprocedural laboratory examination: Secondary | ICD-10-CM | POA: Insufficient documentation

## 2019-09-03 LAB — SARS CORONAVIRUS 2 (TAT 6-24 HRS): SARS Coronavirus 2: NEGATIVE

## 2019-09-03 NOTE — Telephone Encounter (Signed)
Mansouraty, Telford Nab., MD  Jonathon Bellows, MD; Timothy Lasso, RN; Milus Banister, MD  Kobie Whidby,  Please see the prior Inbox thread about this patient.  Dr. Vicente Males has spoken with patient.  Please move forward with scheduling EGD/EUS radial/linear 60 minute case with DJ or myself for further evaluation of recently dilated PD and the pancreatic cyst.  Please let Dr. Vicente Males know when patient's procedure will be.  Thank you.  GM

## 2019-09-06 ENCOUNTER — Other Ambulatory Visit: Payer: Self-pay

## 2019-09-06 DIAGNOSIS — R935 Abnormal findings on diagnostic imaging of other abdominal regions, including retroperitoneum: Secondary | ICD-10-CM

## 2019-09-06 NOTE — Progress Notes (Signed)
Colonoscopy has been rescheduled due to transportation.  Pt has rescheduled from tomorrow to 09/13/19.  Advised of repeat COVID test due to reschedule Thursday 09/09/19.  Instructions sent via mychart.  Thanks,  [place], [place]

## 2019-09-07 ENCOUNTER — Other Ambulatory Visit: Payer: Self-pay

## 2019-09-07 DIAGNOSIS — K862 Cyst of pancreas: Secondary | ICD-10-CM

## 2019-09-07 DIAGNOSIS — R935 Abnormal findings on diagnostic imaging of other abdominal regions, including retroperitoneum: Secondary | ICD-10-CM

## 2019-09-07 NOTE — Telephone Encounter (Signed)
Thanks. GM 

## 2019-09-07 NOTE — Telephone Encounter (Signed)
The pt has been scheduled for EUS on 7/22 with Dr Ardis Hughs at 830 am COVID test on 7/19.  The pt has been sent all information to My Chart and called.

## 2019-09-09 ENCOUNTER — Other Ambulatory Visit
Admission: RE | Admit: 2019-09-09 | Discharge: 2019-09-09 | Disposition: A | Discharge status: Home / Self Care | Payer: Medicare Other | Source: Ambulatory Visit | Attending: Gastroenterology | Admitting: Gastroenterology

## 2019-09-09 ENCOUNTER — Other Ambulatory Visit: Payer: Self-pay

## 2019-09-09 ENCOUNTER — Ambulatory Visit: Payer: Medicare Other | Admitting: Gastroenterology

## 2019-09-09 DIAGNOSIS — Z01812 Encounter for preprocedural laboratory examination: Secondary | ICD-10-CM | POA: Diagnosis present

## 2019-09-09 DIAGNOSIS — Z20822 Contact with and (suspected) exposure to covid-19: Secondary | ICD-10-CM | POA: Insufficient documentation

## 2019-09-10 LAB — SARS CORONAVIRUS 2 (TAT 6-24 HRS): SARS Coronavirus 2: NEGATIVE

## 2019-09-13 ENCOUNTER — Ambulatory Visit: Payer: Medicare Other | Admitting: Anesthesiology

## 2019-09-13 ENCOUNTER — Encounter: Payer: Self-pay | Admitting: Gastroenterology

## 2019-09-13 ENCOUNTER — Encounter
Admission: RE | Disposition: A | Discharge status: Home / Self Care | Payer: Self-pay | Source: Home / Self Care | Attending: Gastroenterology

## 2019-09-13 ENCOUNTER — Ambulatory Visit
Admission: RE | Admit: 2019-09-13 | Discharge: 2019-09-13 | Disposition: A | Discharge status: Home / Self Care | Payer: Medicare Other | Attending: Gastroenterology | Admitting: Gastroenterology

## 2019-09-13 ENCOUNTER — Other Ambulatory Visit: Payer: Self-pay

## 2019-09-13 DIAGNOSIS — K219 Gastro-esophageal reflux disease without esophagitis: Secondary | ICD-10-CM | POA: Diagnosis not present

## 2019-09-13 DIAGNOSIS — Z87891 Personal history of nicotine dependence: Secondary | ICD-10-CM | POA: Diagnosis not present

## 2019-09-13 DIAGNOSIS — D124 Benign neoplasm of descending colon: Secondary | ICD-10-CM

## 2019-09-13 DIAGNOSIS — R197 Diarrhea, unspecified: Secondary | ICD-10-CM | POA: Diagnosis not present

## 2019-09-13 DIAGNOSIS — Z79899 Other long term (current) drug therapy: Secondary | ICD-10-CM | POA: Diagnosis not present

## 2019-09-13 DIAGNOSIS — Z803 Family history of malignant neoplasm of breast: Secondary | ICD-10-CM | POA: Insufficient documentation

## 2019-09-13 DIAGNOSIS — F419 Anxiety disorder, unspecified: Secondary | ICD-10-CM | POA: Insufficient documentation

## 2019-09-13 HISTORY — PX: COLONOSCOPY WITH PROPOFOL: SHX5780

## 2019-09-13 SURGERY — COLONOSCOPY WITH PROPOFOL
Anesthesia: General

## 2019-09-13 MED ORDER — PROPOFOL 10 MG/ML IV BOLUS
INTRAVENOUS | Status: DC | PRN
  Administered 2019-09-13: 80 mg via INTRAVENOUS

## 2019-09-13 MED ORDER — PROPOFOL 500 MG/50ML IV EMUL
INTRAVENOUS | Status: DC | PRN
  Administered 2019-09-13: 150 ug/kg/min via INTRAVENOUS

## 2019-09-13 MED ORDER — PROPOFOL 500 MG/50ML IV EMUL
INTRAVENOUS | Status: AC
  Filled 2019-09-13: qty 50

## 2019-09-13 MED ORDER — SODIUM CHLORIDE 0.9 % IV SOLN: INTRAVENOUS | Status: DC

## 2019-09-13 MED ORDER — LIDOCAINE HCL (CARDIAC) PF 100 MG/5ML IV SOSY
PREFILLED_SYRINGE | INTRAVENOUS | Status: DC | PRN
  Administered 2019-09-13: 50 mg via INTRAVENOUS

## 2019-09-13 NOTE — Op Note (Signed)
[facility] Gastroenterology Patient Name: Claudia Gutierrez Procedure Date: 09/13/2019 9:02 AM MRN: [id number] Account #: [id number] Date of Birth: Jun 24, 1950 Admit Type: Outpatient Age: 69 Room: [facility] ENDO ROOM 4 Gender: Female Note Status: Finalized Procedure:             Colonoscopy Indications:           Clinically significant diarrhea of unexplained origin Providers:             Jonathon Bellows MD, MD Medicines:             Monitored Anesthesia Care Complications:         No immediate complications. Procedure:             Pre-Anesthesia Assessment:                        - Prior to the procedure, a History and Physical was                         performed, and patient medications, allergies and                         sensitivities were reviewed. The patient's tolerance                         of previous anesthesia was reviewed.                        - The risks and benefits of the procedure and the                         sedation options and risks were discussed with the                         patient. All questions were answered and informed                         consent was obtained.                        - ASA Grade Assessment: II - A patient with mild                         systemic disease.                        After obtaining informed consent, the colonoscope was                         passed under direct vision. Throughout the procedure,                         the patient's blood pressure, pulse, and oxygen                         saturations were monitored continuously. The                         Colonoscope was introduced through the anus and  advanced to the the cecum, identified by the                         appendiceal orifice. The colonoscopy was performed                         with ease. The patient tolerated the procedure well.                         The quality of the bowel preparation was  excellent. Findings:      The perianal and digital rectal examinations were normal.      Two sessile polyps were found in the descending colon. The polyps were 4       to 5 mm in size. These polyps were removed with a cold snare. Resection       and retrieval were complete.      Normal mucosa was found in the entire colon. Biopsies were taken with a       cold forceps for histology.      The exam was otherwise without abnormality on direct and retroflexion       views. Impression:            - Two 4 to 5 mm polyps in the descending colon,                         removed with a cold snare. Resected and retrieved.                        - Normal mucosa in the entire examined colon. Biopsied.                        - The examination was otherwise normal on direct and                         retroflexion views. Recommendation:        - Discharge patient to home (with escort).                        - Resume previous diet.                        - Continue present medications.                        - Await pathology results.                        - Repeat colonoscopy for surveillance based on                         pathology results.                        - Return to GI office as previously scheduled. Procedure Code(s):     --- Professional ---                        [phone], Colonoscopy, flexible; with removal of  tumor(s), polyp(s), or other lesion(s) by snare                         technique                        [id number], 26, Colonoscopy, flexible; with biopsy, single                         or multiple Diagnosis Code(s):     --- Professional ---                        K63.5, Polyp of colon                        R19.7, Diarrhea, unspecified CPT copyright 2019 American Medical Association. All rights reserved. The codes documented in this report are preliminary and upon coder review may  be revised to meet current compliance requirements. Jonathon Bellows, MD Jonathon Bellows MD, MD 09/13/2019 9:32:27 AM This report has been signed electronically. Number of Addenda: 0 Note Initiated On: 09/13/2019 9:02 AM Total Procedure Duration: 0 hours 16 minutes 5 seconds  Estimated Blood Loss:  Estimated blood loss: none.      [facility]

## 2019-09-13 NOTE — Anesthesia Postprocedure Evaluation (Signed)
Anesthesia Post Note  Patient: Claudia Gutierrez  Procedure(s) Performed: COLONOSCOPY WITH PROPOFOL (N/A )  Patient location during evaluation: Endoscopy Anesthesia Type: General Level of consciousness: awake and alert Pain management: pain level controlled Vital Signs Assessment: post-procedure vital signs reviewed and stable Respiratory status: spontaneous breathing and respiratory function stable Cardiovascular status: stable Anesthetic complications: no   No complications documented.   Last Vitals:  Vitals:   09/13/19 0938 09/13/19 0944  BP: (!) 90/47 120/67  Pulse: (!) 56 63  Resp: 13 14  Temp:    SpO2: 96% 98%    Last Pain:  Vitals:   09/13/19 0944  TempSrc:   PainSc: 0-No pain                 Kohler Pellerito K

## 2019-09-13 NOTE — Anesthesia Procedure Notes (Signed)
Performed by: Johnna Acosta, CRNA Pre-anesthesia Checklist: Patient identified, Emergency Drugs available, Suction available, Patient being monitored and Timeout performed Patient Re-evaluated:Patient Re-evaluated prior to induction Oxygen Delivery Method: Nasal cannula Preoxygenation: Pre-oxygenation with 100% oxygen Induction Type: IV induction

## 2019-09-13 NOTE — Transfer of Care (Signed)
Immediate Anesthesia Transfer of Care Note  Patient: Claudia Gutierrez  Procedure(s) Performed: COLONOSCOPY WITH PROPOFOL (N/A )  Patient Location: PACU  Anesthesia Type:General  Level of Consciousness: sedated  Airway & Oxygen Therapy: Patient Spontanous Breathing  Post-op Assessment: Report given to RN and Post -op Vital signs reviewed and stable  Post vital signs: Reviewed and stable  Last Vitals:  Vitals Value Taken Time  BP 90/47 09/13/19 0938  Temp    Pulse 56 09/13/19 0938  Resp 13 09/13/19 0938  SpO2 96 % 09/13/19 0938    Last Pain:  Vitals:   09/13/19 0934  TempSrc: Temporal  PainSc: 0-No pain         Complications: No complications documented.

## 2019-09-13 NOTE — H&P (Signed)
Jonathon Bellows, MD [address] [place], [place], [place], [address] [address], [place], [place], [place], [address] Phone: [phone]  Fax: [phone]  Primary Care Physician:  [facility]   Pre-Procedure History & Physical: HPI:  CHIRSTINE DEFRAIN is a 69 y.o. female is here for an colonoscopy.   Past Medical History:  Diagnosis Date  . Acid reflux   . Anxiety     Past Surgical History:  Procedure Laterality Date  . BREAST CYST ASPIRATION Right 2010  . HEEL SPUR SURGERY      Prior to Admission medications   Medication Sig Start Date End Date Taking? Authorizing Provider  citalopram (CELEXA) 20 MG tablet Take 1 tablet by mouth daily. 03/08/15  Yes [provider]  omeprazole (PRILOSEC) 20 MG capsule Take 20 mg by mouth daily. 06/26/19  Yes [provider]  traZODone (DESYREL) 100 MG tablet Take 1 tablet by mouth daily. 05/13/19  Yes [provider]  ALPRAZolam Duanne Moron) 0.5 MG tablet Take 0.5mg  60 mins prior to MRI Patient not taking: Reported on 09/13/2019 08/24/19   Jonathon Bellows, MD  HYDROcodone-acetaminophen (NORCO) 5-325 MG tablet Take 1 tablet by mouth every 6 (six) hours as needed for severe pain. Patient not taking: Reported on 09/13/2019 08/09/19   Merlyn Lot, MD  ondansetron (ZOFRAN) 4 MG tablet Take 1 tablet (4 mg total) by mouth daily as needed. 08/09/19 08/08/20  Merlyn Lot, MD  polyethylene glycol (MIRALAX / GLYCOLAX) 17 g packet Take 17 g by mouth daily. Mix one tablespoon with 8oz of your favorite juice or water every day until you are having soft formed stools. Then start taking once daily if you didn't have a stool the day before. Patient not taking: Reported on 09/13/2019 08/09/19   Merlyn Lot, MD    Allergies as of 08/24/2019 - Review Complete 08/11/2019  Allergen Reaction Noted  . Hydrocodone-acetaminophen Nausea Only and Nausea And Vomiting 08/11/2019  . Paroxetine hcl Other (See  Comments) and Nausea Only 08/11/2019  . Azithromycin  03/05/2013  . Codeine Nausea And Vomiting 08/09/2019  . Cyclobenzaprine  03/05/2013    Family History  Problem Relation Age of Onset  . Breast cancer Sister 45       and stomach ca    Social History   Socioeconomic History  . Marital status: Single    Spouse name: Not on file  . Number of children: Not on file  . Years of education: Not on file  . Highest education level: Not on file  Occupational History  . Not on file  Tobacco Use  . Smoking status: Former Research scientist (life sciences)  . Smokeless tobacco: Never Used  Substance and Sexual Activity  . Alcohol use: Yes  . Drug use: Never  . Sexual activity: Not on file  Other Topics Concern  . Not on file  Social History Narrative  . Not on file   Social Determinants of Health   Financial Resource Strain:   . Difficulty of Paying Living Expenses:   Food Insecurity:   . Worried About Charity fundraiser in the Last Year:   . Arboriculturist in the Last Year:   Transportation Needs:   . Film/video editor (Medical):   Marland Kitchen Lack of Transportation (Non-Medical):   Physical Activity:   . Days of Exercise per Week:   . Minutes of Exercise per Session:   Stress:   . Feeling of Stress :   Social  Connections:   . Frequency of Communication with Friends and Family:   . Frequency of Social Gatherings with Friends and Family:   . Attends Religious Services:   . Active Member of Clubs or Organizations:   . Attends Archivist Meetings:   Marland Kitchen Marital Status:   Intimate Partner Violence:   . Fear of Current or Ex-Partner:   . Emotionally Abused:   Marland Kitchen Physically Abused:   . Sexually Abused:     Review of Systems: See HPI, otherwise negative ROS  Physical Exam: BP (!) 148/85   Temp (!) 97.1 F (36.2 C) (Temporal)   Resp 16   Ht 5\' 6"  (1.676 m)   Wt 68.9 kg   SpO2 100%   BMI 24.53 kg/m  General:   Alert,  pleasant and cooperative in NAD Head:  Normocephalic and  atraumatic. Neck:  Supple; no masses or thyromegaly. Lungs:  Clear throughout to auscultation, normal respiratory effort.    Heart:  +S1, +S2, Regular rate and rhythm, No edema. Abdomen:  Soft, nontender and nondistended. Normal bowel sounds, without guarding, and without rebound.   Neurologic:  Alert and  oriented x4;  grossly normal neurologically.  Impression/Plan: LOUISE RAWSON is here for an colonoscopy to be performed for  Diarrhea  Risks, benefits, limitations, and alternatives regarding  colonoscopy have been reviewed with the patient.  Questions have been answered.  All parties agreeable.   Jonathon Bellows, MD  09/13/2019, 9:08 AM

## 2019-09-13 NOTE — Anesthesia Preprocedure Evaluation (Signed)
Anesthesia Evaluation  Patient identified by MRN, date of birth, ID band Patient awake    Reviewed: Allergy & Precautions, NPO status , Patient's Chart, lab work & pertinent test results  History of Anesthesia Complications Negative for: history of anesthetic complications  Airway Mallampati: II       Dental  (+) Partial Upper   Pulmonary neg sleep apnea, neg COPD, Not current smoker, former smoker,           Cardiovascular (-) hypertension(-) Past MI and (-) CHF (-) dysrhythmias (-) Valvular Problems/Murmurs     Neuro/Psych neg Seizures Anxiety Depression    GI/Hepatic Neg liver ROS, GERD  Medicated and Controlled,  Endo/Other  neg diabetes  Renal/GU negative Renal ROS     Musculoskeletal   Abdominal   Peds  Hematology   Anesthesia Other Findings   Reproductive/Obstetrics                             Anesthesia Physical Anesthesia Plan  ASA: II  Anesthesia Plan: General   Post-op Pain Management:    Induction: Intravenous  PONV Risk Score and Plan: 3 and Propofol infusion, TIVA and Treatment may vary due to age or medical condition  Airway Management Planned:   Additional Equipment:   Intra-op Plan:   Post-operative Plan:   Informed Consent: I have reviewed the patients History and Physical, chart, labs and discussed the procedure including the risks, benefits and alternatives for the proposed anesthesia with the patient or authorized representative who has indicated his/her understanding and acceptance.       Plan Discussed with:   Anesthesia Plan Comments:         Anesthesia Quick Evaluation

## 2019-09-14 ENCOUNTER — Encounter: Payer: Self-pay | Admitting: Gastroenterology

## 2019-09-15 LAB — SURGICAL PATHOLOGY

## 2019-09-20 ENCOUNTER — Telehealth: Payer: Self-pay

## 2019-09-20 NOTE — Telephone Encounter (Signed)
-----   Message from Jonathon Bellows, MD sent at 09/19/2019  1:46 PM EDT ----- Inform biopsies showed only benign changes

## 2019-10-05 ENCOUNTER — Ambulatory Visit
Admission: RE | Admit: 2019-10-05 | Discharge: 2019-10-05 | Disposition: A | Discharge status: Home / Self Care | Payer: Medicare Other | Source: Ambulatory Visit | Attending: Internal Medicine | Admitting: Internal Medicine

## 2019-10-05 ENCOUNTER — Other Ambulatory Visit: Payer: Self-pay

## 2019-10-05 ENCOUNTER — Ambulatory Visit (INDEPENDENT_AMBULATORY_CARE_PROVIDER_SITE_OTHER): Payer: Medicare Other | Admitting: Internal Medicine

## 2019-10-05 ENCOUNTER — Ambulatory Visit
Admission: RE | Admit: 2019-10-05 | Discharge: 2019-10-05 | Disposition: A | Discharge status: Home / Self Care | Payer: Medicare Other | Attending: Internal Medicine | Admitting: Internal Medicine

## 2019-10-05 ENCOUNTER — Encounter: Payer: Self-pay | Admitting: Internal Medicine

## 2019-10-05 VITALS — BP 138/74 | HR 67 | Temp 98.2°F | Ht 66.0 in | Wt 152.0 lb

## 2019-10-05 DIAGNOSIS — Z86010 Personal history of colonic polyps: Secondary | ICD-10-CM

## 2019-10-05 DIAGNOSIS — F325 Major depressive disorder, single episode, in full remission: Secondary | ICD-10-CM

## 2019-10-05 DIAGNOSIS — M533 Sacrococcygeal disorders, not elsewhere classified: Secondary | ICD-10-CM | POA: Diagnosis present

## 2019-10-05 DIAGNOSIS — Z23 Encounter for immunization: Secondary | ICD-10-CM

## 2019-10-05 DIAGNOSIS — K862 Cyst of pancreas: Secondary | ICD-10-CM

## 2019-10-05 MED ORDER — SHINGRIX 50 MCG/0.5ML IM SUSR: 0.5000 mL | Freq: Once | INTRAMUSCULAR | 1 refills | Status: AC

## 2019-10-05 MED ORDER — MELOXICAM 15 MG PO TABS: 15.0000 mg | ORAL_TABLET | Freq: Every day | ORAL | 0 refills | Status: DC

## 2019-10-05 MED ORDER — CYCLOBENZAPRINE HCL 10 MG PO TABS: 10.0000 mg | ORAL_TABLET | Freq: Three times a day (TID) | ORAL | 0 refills | Status: DC | PRN

## 2019-10-05 NOTE — Patient Instructions (Signed)
Pneumococcal Conjugate Vaccine (PCV13): What You Need to Know 1. Why get vaccinated? Pneumococcal conjugate vaccine (PCV13) can prevent pneumococcal disease. Pneumococcal disease refers to any illness caused by pneumococcal bacteria. These bacteria can cause many types of illnesses, including pneumonia, which is an infection of the lungs. Pneumococcal bacteria are one of the most common causes of pneumonia. Besides pneumonia, pneumococcal bacteria can also cause:  Ear infections  Sinus infections  Meningitis (infection of the tissue covering the brain and spinal cord)  Bacteremia (bloodstream infection) Anyone can get pneumococcal disease, but children under 2 years of age, people with certain medical conditions, adults 65 years or older, and cigarette smokers are at the highest risk. Most pneumococcal infections are mild. However, some can result in long-term problems, such as brain damage or hearing loss. Meningitis, bacteremia, and pneumonia caused by pneumococcal disease can be fatal. 2. PCV13 PCV13 protects against 13 types of bacteria that cause pneumococcal disease. Infants and young children usually need 4 doses of pneumococcal conjugate vaccine, at 2, 4, 6, and 12-15 months of age. In some cases, a child might need fewer than 4 doses to complete PCV13 vaccination. A dose of PCV23 vaccine is also recommended for anyone 2 years or older with certain medical conditions if they did not already receive PCV13. This vaccine may be given to adults 65 years or older based on discussions between the patient and health care provider. 3. Talk with your health care provider Tell your vaccine provider if the person getting the vaccine:  Has had an allergic reaction after a previous dose of PCV13, to an earlier pneumococcal conjugate vaccine known as PCV7, or to any vaccine containing diphtheria toxoid (for example, DTaP), or has any severe, life-threatening allergies.  In some cases, your health  care provider may decide to postpone PCV13 vaccination to a future visit. People with minor illnesses, such as a cold, may be vaccinated. People who are moderately or severely ill should usually wait until they recover before getting PCV13. Your health care provider can give you more information. 4. Risks of a vaccine reaction  Redness, swelling, pain, or tenderness where the shot is given, and fever, loss of appetite, fussiness (irritability), feeling tired, headache, and chills can happen after PCV13. Young children may be at increased risk for seizures caused by fever after PCV13 if it is administered at the same time as inactivated influenza vaccine. Ask your health care provider for more information. People sometimes faint after medical procedures, including vaccination. Tell your provider if you feel dizzy or have vision changes or ringing in the ears. As with any medicine, there is a very remote chance of a vaccine causing a severe allergic reaction, other serious injury, or death. 5. What if there is a serious problem? An allergic reaction could occur after the vaccinated person leaves the clinic. If you see signs of a severe allergic reaction (hives, swelling of the face and throat, difficulty breathing, a fast heartbeat, dizziness, or weakness), call 9-1-1 and get the person to the nearest hospital. For other signs that concern you, call your health care provider. Adverse reactions should be reported to the Vaccine Adverse Event Reporting System (VAERS). Your health care provider will usually file this report, or you can do it yourself. Visit the VAERS website at www.vaers.hhs.gov or call 1-800-822-7967. VAERS is only for reporting reactions, and VAERS staff do not give medical advice. 6. The National Vaccine Injury Compensation Program The National Vaccine Injury Compensation Program (VICP) is a federal program   that was created to compensate people who may have been injured by certain  vaccines. Visit the VICP website at www.hrsa.gov/vaccinecompensation or call 1-800-338-2382 to learn about the program and about filing a claim. There is a time limit to file a claim for compensation. 7. How can I learn more?  Ask your health care provider.  Call your local or state health department.  Contact the Centers for Disease Control and Prevention (CDC): ? Call 1-800-232-4636 (1-800-CDC-INFO) or ? Visit CDC's website at www.cdc.gov/vaccines Vaccine Information Statement PCV13 Vaccine (01/28/2018) This information is not intended to replace advice given to you by your health care provider. Make sure you discuss any questions you have with your health care provider. Document Revised: 07/07/2018 Document Reviewed: 10/28/2017 Elsevier Patient Education  2020 Elsevier Inc.   

## 2019-10-05 NOTE — Progress Notes (Signed)
Date:  10/05/2019   Name:  Claudia Gutierrez   DOB:  1950-09-25   MRN:  [id number]   Chief Complaint: Establish Care (wants [id number], has questions about shingrex) and Back Pain (X1 month, radiates to groin area, hurts when she bends down, sore to touch)  Back Pain This is a new problem. The current episode started 1 to 4 weeks ago. The problem is unchanged. The pain is present in the lumbar spine. The pain does not radiate. The symptoms are aggravated by twisting and bending. Pertinent negatives include no abdominal pain, chest pain, dysuria, fever, headaches or numbness. She has tried ice and heat for the symptoms. The treatment provided mild relief.  Depression        This is a chronic problem.  The problem has been resolved since onset.  Associated symptoms include no fatigue, no appetite change, no myalgias and no headaches.  Past treatments include SSRIs - Selective serotonin reuptake inhibitors.  Compliance with treatment is good.  Previous treatment provided significant relief. Pancreatic cyst - found incidentally.  She denies pain, nausea, vomiting, weight loss.  Lipase was normal.  She has an EGD with drainage/stent scheduled for later this month.  Lab Results  Component Value Date   CREATININE 0.91 08/09/2019   BUN 14 08/09/2019   NA 142 08/09/2019   K 4.2 08/09/2019   CL 109 08/09/2019   CO2 25 08/09/2019    Lab Results  Component Value Date   WBC 4.5 08/09/2019   HGB 12.2 08/09/2019   HCT 37.5 08/09/2019   MCV 93.1 08/09/2019   PLT 155 08/09/2019   Lab Results  Component Value Date   ALT 27 08/09/2019   AST 34 08/09/2019   ALKPHOS 52 08/09/2019   BILITOT 0.6 08/09/2019     Review of Systems  Constitutional: Negative for appetite change, fatigue, fever and unexpected weight change.  Eyes: Negative for visual disturbance.  Respiratory: Negative for cough, chest tightness, shortness of breath and wheezing.   Cardiovascular: Negative for chest pain, palpitations and  leg swelling.  Gastrointestinal: Negative for abdominal pain, constipation and diarrhea.  Genitourinary: Negative for dysuria, hematuria, vaginal bleeding, vaginal discharge and vaginal pain.  Musculoskeletal: Positive for back pain. Negative for arthralgias, gait problem and myalgias.  Neurological: Negative for dizziness, tremors, numbness and headaches.  Psychiatric/Behavioral: Positive for depression. Negative for dysphoric mood and sleep disturbance. The patient is not nervous/anxious.     Patient Active Problem List   Diagnosis Date Noted  . Calculus of lower urinary tract 01/12/2014  . GERD (gastroesophageal reflux disease) 10/12/2013  . Major depressive disorder, single episode, unspecified 10/12/2013    Allergies  Allergen Reactions  . Azithromycin Other (See Comments)    Other reaction(s): SWELLING/EDEMA  . Hydrocodone-Acetaminophen Nausea Only and Nausea And Vomiting  . Paroxetine Hcl Other (See Comments) and Nausea Only    Other reaction(s): Other (See Comments) Other Reaction: GI Upset  . Codeine Nausea And Vomiting  . Cyclobenzaprine Other (See Comments)    Other reaction(s): OTHER    Past Surgical History:  Procedure Laterality Date  . BREAST CYST ASPIRATION Right 2010  . COLONOSCOPY WITH PROPOFOL N/A 09/13/2019   Procedure: COLONOSCOPY WITH PROPOFOL;  Surgeon: Jonathon Bellows, MD;  Location: [facility] ENDOSCOPY;  Service: Gastroenterology;  Laterality: N/A;  . HEEL SPUR SURGERY      Social History   Tobacco Use  . Smoking status: Former Smoker    Years: 10.00    Quit date: 2002  Years since quitting: 19.5  . Smokeless tobacco: Never Used  Vaping Use  . Vaping Use: Never used  Substance Use Topics  . Alcohol use: Yes    Comment: socially  . Drug use: Never     Medication list has been reviewed and updated.  Current Meds  Medication Sig  . citalopram (CELEXA) 20 MG tablet Take 1 tablet by mouth daily.  Marland Kitchen omeprazole (PRILOSEC) 20 MG capsule Take 20 mg by  mouth daily.  . polyethylene glycol (MIRALAX / GLYCOLAX) 17 g packet Take 17 g by mouth daily. Mix one tablespoon with 8oz of your favorite juice or water every day until you are having soft formed stools. Then start taking once daily if you didn't have a stool the day before.  . traZODone (DESYREL) 100 MG tablet Take 1 tablet by mouth daily.    PHQ 2/9 Scores 10/05/2019  PHQ - 2 Score 1  PHQ- 9 Score 1    GAD 7 : Generalized Anxiety Score 10/05/2019  Nervous, Anxious, on Edge 0  Control/stop worrying 0  Worry too much - different things 0  Trouble relaxing 0  Restless 0  Easily annoyed or irritable 0  Afraid - awful might happen 0  Total GAD 7 Score 0  Anxiety Difficulty Not difficult at all    BP Readings from Last 3 Encounters:  10/05/19 138/74  09/13/19 (!) 161/139  08/11/19 (!) 143/67    Physical Exam Vitals and nursing note reviewed.  Constitutional:      General: She is not in acute distress.    Appearance: Normal appearance. She is well-developed.  HENT:     Head: Normocephalic and atraumatic.  Neck:     Vascular: No carotid bruit.  Cardiovascular:     Rate and Rhythm: Normal rate and regular rhythm.     Pulses: Normal pulses.     Heart sounds: No murmur heard.   Pulmonary:     Effort: Pulmonary effort is normal. No respiratory distress.     Breath sounds: No wheezing or rhonchi.  Musculoskeletal:     Cervical back: Normal range of motion.     Lumbar back: Spasms and tenderness present. Negative right straight leg raise test and negative left straight leg raise test.     Right lower leg: No edema.     Left lower leg: No edema.  Lymphadenopathy:     Cervical: No cervical adenopathy.  Skin:    General: Skin is warm and dry.     Findings: No rash.  Neurological:     Mental Status: She is alert and oriented to person, place, and time.     Sensory: Sensation is intact.     Motor: Motor function is intact.     Gait: Gait is intact.     Deep Tendon Reflexes:       Reflex Scores:      Patellar reflexes are 2+ on the right side and 2+ on the left side. Psychiatric:        Behavior: Behavior normal.        Thought Content: Thought content normal.     Wt Readings from Last 3 Encounters:  10/05/19 152 lb (68.9 kg)  09/13/19 152 lb (68.9 kg)  08/11/19 155 lb (70.3 kg)    BP 138/74   Pulse 67   Temp 98.2 F (36.8 C) (Oral)   Ht 5\' 6"  (1.676 m)   Wt 152 lb (68.9 kg)   SpO2 97%   BMI 24.53  kg/m   Assessment and Plan: 1. Sacro-iliac pain Doubt HNP - will get plain films and treat conservatively If no improvement, refer to PT - DG Lumbar Spine Complete; Future - meloxicam (MOBIC) 15 MG tablet; Take 1 tablet (15 mg total) by mouth daily.  Dispense: 30 tablet; Refill: 0 - cyclobenzaprine (FLEXERIL) 10 MG tablet; Take 1 tablet (10 mg total) by mouth 3 (three) times daily as needed for muscle spasms.  Dispense: 30 tablet; Refill: 0  2. History of colon polyps  3. Need for vaccination for pneumococcus Given today - Pneumococcal conjugate vaccine 13-valent IM  4. Need for shingles vaccine To be given by pharmacy after 12/01/19 - Zoster Vaccine Adjuvanted [facility]) injection; Inject 0.5 mLs into the muscle once for 1 dose.  Dispense: 0.5 mL; Refill: 1  5. Major depressive disorder with single episode, in full remission ([place]) Clinically stable on current regimen with good control of symptoms, No SI or HI. Will continue current therapy with Celexa daily and Trazodone PRN.  6. Pancreatic cyst EGD and drainage procedure scheduled    Partially dictated using Editor, commissioning. Any errors are unintentional.  Halina Maidens, MD [facility]  10/05/2019

## 2019-10-18 ENCOUNTER — Other Ambulatory Visit (HOSPITAL_COMMUNITY)
Admission: RE | Admit: 2019-10-18 | Discharge: 2019-10-18 | Disposition: A | Discharge status: Home / Self Care | Payer: Medicare Other | Source: Ambulatory Visit | Attending: Gastroenterology | Admitting: Gastroenterology

## 2019-10-18 ENCOUNTER — Telehealth: Payer: Self-pay | Admitting: Internal Medicine

## 2019-10-18 ENCOUNTER — Other Ambulatory Visit: Payer: Self-pay | Admitting: Internal Medicine

## 2019-10-18 DIAGNOSIS — Z20822 Contact with and (suspected) exposure to covid-19: Secondary | ICD-10-CM | POA: Insufficient documentation

## 2019-10-18 DIAGNOSIS — Z01812 Encounter for preprocedural laboratory examination: Secondary | ICD-10-CM | POA: Insufficient documentation

## 2019-10-18 DIAGNOSIS — F325 Major depressive disorder, single episode, in full remission: Secondary | ICD-10-CM

## 2019-10-18 LAB — SARS CORONAVIRUS 2 (TAT 6-24 HRS): SARS Coronavirus 2: NEGATIVE

## 2019-10-18 MED ORDER — TRAZODONE HCL 100 MG PO TABS: 100.0000 mg | ORAL_TABLET | Freq: Every evening | ORAL | 2 refills | Status: DC | PRN

## 2019-10-18 NOTE — Telephone Encounter (Signed)
Medication Refill - Medication: trazodone   Has the patient contacted their pharmacy? Yes.   (Agent: If no, request that the patient contact the pharmacy for the refill.) (Agent: If yes, when and what did the pharmacy advise?)  Preferred Pharmacy (with phone number or street name): [facility], [place]: Please be advised that RX refills may take up to 3 business days. We ask that you follow-up with your pharmacy.

## 2019-10-21 ENCOUNTER — Ambulatory Visit (HOSPITAL_COMMUNITY): Payer: Medicare Other | Admitting: Anesthesiology

## 2019-10-21 ENCOUNTER — Other Ambulatory Visit: Payer: Self-pay

## 2019-10-21 ENCOUNTER — Ambulatory Visit (HOSPITAL_COMMUNITY)
Admission: RE | Admit: 2019-10-21 | Discharge: 2019-10-21 | Disposition: A | Discharge status: Home / Self Care | Payer: Medicare Other | Attending: Gastroenterology | Admitting: Gastroenterology

## 2019-10-21 ENCOUNTER — Encounter (HOSPITAL_COMMUNITY)
Admission: RE | Disposition: A | Discharge status: Home / Self Care | Payer: Self-pay | Source: Home / Self Care | Attending: Gastroenterology

## 2019-10-21 ENCOUNTER — Encounter (HOSPITAL_COMMUNITY): Payer: Self-pay | Admitting: Gastroenterology

## 2019-10-21 DIAGNOSIS — Z87891 Personal history of nicotine dependence: Secondary | ICD-10-CM | POA: Insufficient documentation

## 2019-10-21 DIAGNOSIS — K862 Cyst of pancreas: Secondary | ICD-10-CM | POA: Diagnosis not present

## 2019-10-21 DIAGNOSIS — R935 Abnormal findings on diagnostic imaging of other abdominal regions, including retroperitoneum: Secondary | ICD-10-CM

## 2019-10-21 DIAGNOSIS — K219 Gastro-esophageal reflux disease without esophagitis: Secondary | ICD-10-CM | POA: Diagnosis not present

## 2019-10-21 DIAGNOSIS — K259 Gastric ulcer, unspecified as acute or chronic, without hemorrhage or perforation: Secondary | ICD-10-CM | POA: Diagnosis not present

## 2019-10-21 DIAGNOSIS — K2970 Gastritis, unspecified, without bleeding: Secondary | ICD-10-CM

## 2019-10-21 DIAGNOSIS — R1012 Left upper quadrant pain: Secondary | ICD-10-CM | POA: Diagnosis not present

## 2019-10-21 DIAGNOSIS — K319 Disease of stomach and duodenum, unspecified: Secondary | ICD-10-CM | POA: Insufficient documentation

## 2019-10-21 HISTORY — PX: ESOPHAGOGASTRODUODENOSCOPY (EGD) WITH PROPOFOL: SHX5813

## 2019-10-21 HISTORY — PX: BIOPSY: SHX5522

## 2019-10-21 HISTORY — PX: EUS: SHX5427

## 2019-10-21 HISTORY — PX: FINE NEEDLE ASPIRATION: SHX5430

## 2019-10-21 SURGERY — UPPER ENDOSCOPIC ULTRASOUND (EUS) RADIAL
Anesthesia: Monitor Anesthesia Care

## 2019-10-21 MED ORDER — PROPOFOL 10 MG/ML IV BOLUS
INTRAVENOUS | Status: DC | PRN
  Administered 2019-10-21: 60 mg via INTRAVENOUS

## 2019-10-21 MED ORDER — DIPHENHYDRAMINE HCL 50 MG/ML IJ SOLN
25.0000 mg | Freq: Once | INTRAMUSCULAR | Status: AC
  Administered 2019-10-21: 25 mg via INTRAVENOUS

## 2019-10-21 MED ORDER — CIPROFLOXACIN HCL 500 MG PO TABS
500.0000 mg | ORAL_TABLET | Freq: Two times a day (BID) | ORAL | 0 refills | Status: DC
Start: 2019-10-21 — End: 2019-10-21

## 2019-10-21 MED ORDER — CIPROFLOXACIN IN D5W 400 MG/200ML IV SOLN
INTRAVENOUS | Status: AC
  Filled 2019-10-21: qty 200

## 2019-10-21 MED ORDER — SODIUM CHLORIDE 0.9 % IV SOLN: INTRAVENOUS | Status: DC

## 2019-10-21 MED ORDER — CIPROFLOXACIN IN D5W 400 MG/200ML IV SOLN
INTRAVENOUS | Status: DC | PRN
Start: 2019-10-21 — End: 2019-10-21
  Administered 2019-10-21: 400 mg via INTRAVENOUS

## 2019-10-21 MED ORDER — LACTATED RINGERS IV SOLN: INTRAVENOUS | Status: DC

## 2019-10-21 MED ORDER — PROPOFOL 500 MG/50ML IV EMUL
INTRAVENOUS | Status: DC | PRN
  Administered 2019-10-21: 125 ug/kg/min via INTRAVENOUS

## 2019-10-21 MED ORDER — LIDOCAINE 2% (20 MG/ML) 5 ML SYRINGE
INTRAMUSCULAR | Status: DC | PRN
  Administered 2019-10-21: 60 mg via INTRAVENOUS

## 2019-10-21 MED ORDER — DIPHENHYDRAMINE HCL 50 MG/ML IJ SOLN
INTRAMUSCULAR | Status: AC
  Filled 2019-10-21: qty 1

## 2019-10-21 MED ORDER — CEPHALEXIN 500 MG PO CAPS: 500.0000 mg | ORAL_CAPSULE | Freq: Three times a day (TID) | ORAL | 0 refills | Status: AC

## 2019-10-21 SURGICAL SUPPLY — 14 items

## 2019-10-21 NOTE — Progress Notes (Signed)
Made Dr. Ardis Hughs aware of IV site with no more redness or tracking along the vein and that patient feels better and no more itching. Aware Dr. Kalman Shan saw the patient and agrees that it looks good. Dr. Ardis Hughs is good with patient being discharged at this time.

## 2019-10-21 NOTE — H&P (Signed)
HPI: This is a very pleasant 69 yo woman with left upper quad abd pains, loose stools that led to imaging by [place], CT and then MR.  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  Normal lfts, normal pancreatic enzymes.    She's been gaining weight  Never etoh abuser.  No FH of pancreatic disease  Referred by Jonathon Bellows, MD  ROS: complete GI ROS as described in HPI, all other review negative.  Constitutional:  No unintentional weight loss   Past Medical History:  Diagnosis Date  . Acid reflux   . Anxiety     Past Surgical History:  Procedure Laterality Date  . ACHILLES TENDON REPAIR  2008  . BREAST CYST ASPIRATION Right 2010  . COLONOSCOPY WITH PROPOFOL N/A 09/13/2019   Procedure: COLONOSCOPY WITH PROPOFOL;  Surgeon: Jonathon Bellows, MD;  Location: [facility] ENDOSCOPY;  Service: Gastroenterology;  Laterality: N/A;    Current Facility-Administered Medications  Medication Dose Route Frequency Provider Last Rate Last Admin  . 0.9 %  sodium chloride infusion   Intravenous Continuous Milus Banister, MD      . lactated ringers infusion   Intravenous Continuous Milus Banister, MD 10 mL/hr at 10/21/19 0759 Continued from Pre-op at 10/21/19 0759    Allergies as of 09/07/2019 - Review Complete 08/11/2019  Allergen Reaction Noted  . Hydrocodone-acetaminophen Nausea Only and Nausea And Vomiting 08/11/2019  . Paroxetine hcl Other (See Comments) and Nausea Only 08/11/2019  . Azithromycin  03/05/2013  . Codeine Nausea And Vomiting 08/09/2019  . Cyclobenzaprine  03/05/2013    Family History  Problem Relation Age of Onset  . Breast cancer Sister 48       and stomach ca  . Diabetes Sister   . Lung cancer Father   . Diabetes Father   . Heart disease Father   . Hypertension Father   . Stroke Paternal Grandmother     Social History   Socioeconomic History  . Marital status: Single    Spouse name: Not on file  . Number of children: Not on file  . Years of  education: Not on file  . Highest education level: Not on file  Occupational History  . Not on file  Tobacco Use  . Smoking status: Former Smoker    Years: 10.00    Quit date: 2002    Years since quitting: 19.5  . Smokeless tobacco: Never Used  Vaping Use  . Vaping Use: Never used  Substance and Sexual Activity  . Alcohol use: Yes    Comment: socially  . Drug use: Never  . Sexual activity: Not Currently  Other Topics Concern  . Not on file  Social History Narrative  . Not on file   Social Determinants of Health   Financial Resource Strain:   . Difficulty of Paying Living Expenses:   Food Insecurity:   . Worried About Charity fundraiser in the Last Year:   . Arboriculturist in the Last Year:   Transportation Needs:   . Film/video editor (Medical):   Marland Kitchen Lack of Transportation (Non-Medical):   Physical Activity:   . Days of Exercise per Week:   . Minutes of Exercise per Session:   Stress:   . Feeling of Stress :   Social Connections:   . Frequency of Communication with Friends and Family:   . Frequency of Social Gatherings with Friends and Family:   . Attends Religious Services:   . Active  Member of Clubs or Organizations:   . Attends Archivist Meetings:   Marland Kitchen Marital Status:   Intimate Partner Violence:   . Fear of Current or Ex-Partner:   . Emotionally Abused:   Marland Kitchen Physically Abused:   . Sexually Abused:      Physical Exam: BP (!) 140/55   Pulse 64   Temp 98.2 F (36.8 C) (Oral)   Resp 18   Ht 5\' 6"  (1.676 m)   Wt 72.1 kg   SpO2 100%   BMI 25.66 kg/m  Constitutional: generally well-appearing Psychiatric: alert and oriented x3 Abdomen: soft, nontender, nondistended, no obvious ascites, no peritoneal signs, normal bowel sounds No peripheral edema noted in lower extremities  Assessment and plan: 69 y.o. female with pancreatic cyst  EUS evaluation today  Please see the "Patient Instructions" section for addition details about the  plan.  Owens Loffler, MD [facility] 10/21/2019, 8:01 AM

## 2019-10-21 NOTE — Progress Notes (Signed)
Redness to posterior hand and IV site improving. Pt denies SHOB. No urticaria noted. Cont to monitor.

## 2019-10-21 NOTE — Anesthesia Postprocedure Evaluation (Signed)
Anesthesia Post Note  Patient: Claudia Gutierrez  Procedure(s) Performed: UPPER ENDOSCOPIC ULTRASOUND (EUS) RADIAL (N/A ) ESOPHAGOGASTRODUODENOSCOPY (EGD) WITH PROPOFOL (N/A ) BIOPSY FINE NEEDLE ASPIRATION (FNA) LINEAR (N/A )     Patient location during evaluation: PACU Anesthesia Type: MAC Level of consciousness: awake and alert Pain management: pain level controlled Vital Signs Assessment: post-procedure vital signs reviewed and stable Respiratory status: spontaneous breathing, nonlabored ventilation, respiratory function stable and patient connected to nasal cannula oxygen Cardiovascular status: stable and blood pressure returned to baseline Postop Assessment: no apparent nausea or vomiting Anesthetic complications: no   No complications documented.  Last Vitals:  Vitals:   10/21/19 0915 10/21/19 0920  BP:  (!) 173/76  Pulse:  (!) 56  Resp:  13  Temp:    SpO2: 97% 97%    Last Pain:  Vitals:   10/21/19 0920  TempSrc:   PainSc: 0-No pain                 Marijean Montanye S

## 2019-10-21 NOTE — Transfer of Care (Signed)
Immediate Anesthesia Transfer of Care Note  Patient: Claudia Gutierrez  Procedure(s) Performed: UPPER ENDOSCOPIC ULTRASOUND (EUS) RADIAL (N/A ) ESOPHAGOGASTRODUODENOSCOPY (EGD) WITH PROPOFOL (N/A )  Patient Location: Endoscopy Unit  Anesthesia Type:MAC  Level of Consciousness: awake, alert , oriented and patient cooperative  Airway & Oxygen Therapy: Patient Spontanous Breathing and Patient connected to face mask  Post-op Assessment: Report given to RN and Post -op Vital signs reviewed and stable  Post vital signs: Reviewed and stable  Last Vitals:  Vitals Value Taken Time  BP    Temp    Pulse    Resp    SpO2      Last Pain:  Vitals:   10/21/19 0732  TempSrc: Oral  PainSc: 5          Complications: No complications documented.

## 2019-10-21 NOTE — Anesthesia Preprocedure Evaluation (Signed)
Anesthesia Evaluation  Patient identified by MRN, date of birth, ID band Patient awake    Reviewed: Allergy & Precautions, NPO status , Patient's Chart, lab work & pertinent test results  Airway Mallampati: II  TM Distance: >3 FB Neck ROM: Full    Dental no notable dental hx.    Pulmonary neg pulmonary ROS, former smoker,    Pulmonary exam normal breath sounds clear to auscultation       Cardiovascular negative cardio ROS Normal cardiovascular exam Rhythm:Regular Rate:Normal     Neuro/Psych Anxiety negative neurological ROS     GI/Hepatic Neg liver ROS, GERD  Medicated,  Endo/Other  negative endocrine ROS  Renal/GU negative Renal ROS  negative genitourinary   Musculoskeletal negative musculoskeletal ROS (+)   Abdominal   Peds negative pediatric ROS (+)  Hematology negative hematology ROS (+)   Anesthesia Other Findings   Reproductive/Obstetrics negative OB ROS                             Anesthesia Physical Anesthesia Plan  ASA: II  Anesthesia Plan: MAC   Post-op Pain Management:    Induction: Intravenous  PONV Risk Score and Plan: 0 and Propofol infusion  Airway Management Planned: Simple Face Mask  Additional Equipment:   Intra-op Plan:   Post-operative Plan:   Informed Consent: I have reviewed the patients History and Physical, chart, labs and discussed the procedure including the risks, benefits and alternatives for the proposed anesthesia with the patient or authorized representative who has indicated his/her understanding and acceptance.     Dental advisory given  Plan Discussed with: CRNA and Surgeon  Anesthesia Plan Comments:         Anesthesia Quick Evaluation

## 2019-10-21 NOTE — Progress Notes (Signed)
Redness at IV site is almost all gone. There is no more redness along the vein. Pt states she is feeling better. No more itching. Dr. Kalman Shan by to see and agrees the site looks good.

## 2019-10-21 NOTE — Op Note (Addendum)
[facility] Patient Name: Claudia Gutierrez Procedure Date: 10/21/2019 MRN: [id number] Attending MD: Milus Banister , MD Date of Birth: October 10, 1950 CSN: [id number] Age: 69 Admit Type: Outpatient Procedure:                Upper EUS Indications:              LUQ pain and loose stools led to imaging ([place], CT                            and MR). These showed a pancreatic neck cyst with a                            possible mural nodule. Normal LFTs, normal lipase.                            No etoh abuse, no personal or FH of pancreatic                            disease, + gaining weight Providers:                Milus Banister, MD, Cleda Daub, RN, Elspeth Cho Tech., Technician, Dellie Catholic Referring MD:             Jonathon Bellows, MD Medicines:                Monitored Anesthesia Care, Cipro 539 mg IV Complications:            No immediate complications. Estimated blood loss:                            None. Estimated Blood Loss:     Estimated blood loss: none. Procedure:                Pre-Anesthesia Assessment:                           - Prior to the procedure, a History and Physical                            was performed, and patient medications and                            allergies were reviewed. The patient's tolerance of                            previous anesthesia was also reviewed. The risks                            and benefits of the procedure and the sedation                            options and risks were discussed with the patient.  All questions were answered, and informed consent                            was obtained. Prior Anticoagulants: The patient has                            taken no previous anticoagulant or antiplatelet                            agents. ASA Grade Assessment: II - A patient with                            mild systemic disease. After reviewing the risks                             and benefits, the patient was deemed in                            satisfactory condition to undergo the procedure.                           After obtaining informed consent, the endoscope was                            passed under direct vision. Throughout the                            procedure, the patient's blood pressure, pulse, and                            oxygen saturations were monitored continuously. The                            GF-UE160-[id number] ([id number]) Olympus Radial EUS was                            introduced through the mouth, and advanced to the                            second part of duodenum. The upper EUS was                            accomplished without difficulty. The patient                            tolerated the procedure well. Scope In: Scope Out: Findings:      ENDOSCOPIC FINDING: :      Mild inflammation characterized by erythema was found in the gastric       antrum. Biopsies were taken with a cold forceps for histology.      The UGI tract was otherwise endoscopically normal including very good       views of normal appearing major papilla (using a duodenoscope).      ENDOSONOGRAPHIC FINDING: :      1. An  anechoic lesion suggestive of a cyst was identified in the       pancreatic neck. It does not communicate with the pancreatic duct. The       lesion measured 8 mm by 9 mm in maximal cross-sectional diameter. There       was a single compartment without septae. There was a hyperechoic, 76mm,       shadowing foci along the wall of the cyst but no other soft tissue       lesions. The outer wall of the lesion was not seen. Diagnostic needle       aspiration for fluid was performed. Color Doppler imaging was utilized       prior to needle puncture to confirm a lack of significant vascular       structures within the needle path. One pass was made with the 22 gauge       needle using a transgastric approach. The amount of fluid collected was        0.6 mL. Sample(s) were sent for cytology.      2. Main pancreatic duct measured 5mm in the head and tapered smoothly       into the tail of the pancreas.      3. CBD was normal, non-dilated and contained no stones.      4. No peripancreatic adenopathy.      5. Limited views of the gallbladder, liver, spleen were all normal.      She had itchy, red streaking at the IV site during cipro infusion. The       infusion was stopped and she was given benedryl 25mg  IV. Impression:               - Mild, non-specific gastritis was biopsied to                            check for H. pylori.                           - 35mm by 5mm cyst in the neck of the pancreas was                            noted, there was a 63mm hyperechoic, shadowing foci                            (calcific?) along the wall of the cyst. The cyst                            was completely aspirated with transgastric EUS FNA,                            yielding a very small amount of clear fluid that                            was sent for cytology. There was not enough fluid                            to send for CEA or amylase. The cyst clearly does  not involve the main pancreatic duct. Moderate Sedation:      Not Applicable - Patient had care per Anesthesia. Recommendation:           - Discharge patient to home.                           - Await final cytology (cyst fluid) and pathology                            (mild gastritis) results.                           - she will complete 3 days of TID keflex. Procedure Code(s):        --- Professional ---                           [phone], Esophagogastroduodenoscopy, flexible,                            transoral; with transendoscopic ultrasound-guided                            intramural or transmural fine needle                            aspiration/biopsy(s), (includes endoscopic                            ultrasound examination limited to the  esophagus,                            stomach or duodenum, and adjacent structures)                           43239, 59, Esophagogastroduodenoscopy, flexible,                            transoral; with biopsy, single or multiple Diagnosis Code(s):        --- Professional ---                           K29.70, Gastritis, unspecified, without bleeding                           K86.2, Cyst of pancreas CPT copyright 2019 American Medical Association. All rights reserved. The codes documented in this report are preliminary and upon coder review may  be revised to meet current compliance requirements. Milus Banister, MD 10/21/2019 8:56:40 AM This report has been signed electronically. Number of Addenda: 0

## 2019-10-21 NOTE — Discharge Instructions (Signed)
YOU HAD AN ENDOSCOPIC PROCEDURE TODAY: Refer to the procedure report and other information in the discharge instructions given to you for any specific questions about what was found during the examination. If this information does not answer your questions, please call [place] office at [phone] to clarify.   YOU SHOULD EXPECT: Some feelings of bloating in the abdomen. Passage of more gas than usual. Walking can help get rid of the air that was put into your GI tract during the procedure and reduce the bloating. If you had a lower endoscopy (such as a colonoscopy or flexible sigmoidoscopy) you may notice spotting of blood in your stool or on the toilet paper. Some abdominal soreness may be present for a day or two, also.  DIET: Your first meal following the procedure should be a light meal and then it is ok to progress to your normal diet. A half-sandwich or bowl of soup is an example of a good first meal. Heavy or fried foods are harder to digest and may make you feel nauseous or bloated. Drink plenty of fluids but you should avoid alcoholic beverages for 24 hours. If you had a esophageal dilation, please see attached instructions for diet.    ACTIVITY: Your care partner should take you home directly after the procedure. You should plan to take it easy, moving slowly for the rest of the day. You can resume normal activity the day after the procedure however YOU SHOULD NOT DRIVE, use power tools, machinery or perform tasks that involve climbing or major physical exertion for 24 hours (because of the sedation medicines used during the test).   SYMPTOMS TO REPORT IMMEDIATELY: A gastroenterologist can be reached at any hour. Please call [phone]  for any of the following symptoms:   Following upper endoscopy (EGD, EUS, ERCP, esophageal dilation) Vomiting of blood or coffee ground material  New, significant abdominal pain  New, significant chest pain or pain under the shoulder blades  Painful or  persistently difficult swallowing  New shortness of breath  Black, tarry-looking or red, bloody stools  FOLLOW UP:  If any biopsies were taken you will be contacted by phone or by letter within the next 1-3 weeks. Call [phone]  if you have not heard about the biopsies in 3 weeks.  Please also call with any specific questions about appointments or follow up tests.  

## 2019-10-21 NOTE — Progress Notes (Addendum)
Pt noted to have redness at IV site with redness following line of vein. IV Cipro clamped and added as allergy to chart. Ice applied to hand. Provider made aware and at bedside to eval. Pt denies SHOB. No urticaria noted on body at this time. Med a/o with IV benadryl. Cont to monitor.

## 2019-10-22 ENCOUNTER — Other Ambulatory Visit: Payer: Self-pay

## 2019-10-22 ENCOUNTER — Encounter (HOSPITAL_COMMUNITY): Payer: Self-pay | Admitting: Gastroenterology

## 2019-10-22 LAB — SURGICAL PATHOLOGY

## 2019-10-22 LAB — CYTOLOGY - NON PAP

## 2019-11-17 ENCOUNTER — Telehealth: Payer: Self-pay | Admitting: Internal Medicine

## 2019-11-17 NOTE — Telephone Encounter (Signed)
She recently had and EGD that showed gastritis (inflammation of the stomach lining) and she should not be taking any non steroidal, only tylenol.

## 2019-11-17 NOTE — Telephone Encounter (Signed)
Please Advise. Last office visit 10/05/2019.  KP

## 2019-11-17 NOTE — Telephone Encounter (Signed)
Refill sent.

## 2019-11-17 NOTE — Telephone Encounter (Signed)
Medication Refill - Medication:  meloxicam (MOBIC) 15 MG tablet [[id number]]  Pt stated that her back Is bothering her again and would like to know if she could get a refill on this  Has the patient contacted their pharmacy? No. (Agent: If no, request that the patient contact the pharmacy for the refill.) (Agent: If yes, when and what did the pharmacy advise?)  Preferred Pharmacy (with phone number or street name): [facility] [place]   Agent: Please be advised that RX refills may take up to 3 business days. We ask that you follow-up with your pharmacy.

## 2019-11-17 NOTE — Telephone Encounter (Signed)
Called pt left VM to call back.  KP 

## 2019-11-18 NOTE — Telephone Encounter (Signed)
Called pt left VM to call back.  KP 

## 2019-11-22 ENCOUNTER — Telehealth: Payer: Self-pay | Admitting: Internal Medicine

## 2019-11-22 ENCOUNTER — Other Ambulatory Visit: Payer: Self-pay

## 2019-11-22 MED ORDER — CITALOPRAM HYDROBROMIDE 20 MG PO TABS: 20.0000 mg | ORAL_TABLET | Freq: Every day | ORAL | 1 refills | Status: DC

## 2019-11-22 NOTE — Telephone Encounter (Signed)
RX REFILL citalopram (CELEXA) 20 MG tablet [[id number]]  [place], [address] Phone:  [phone]  Fax:  [phone]      Patient is requesting rxrefill. Patient states PCP did not write medication but spoke with PCP regarding and patient states PCP would approve rx refill when needed

## 2019-12-16 ENCOUNTER — Other Ambulatory Visit: Payer: Self-pay

## 2019-12-16 ENCOUNTER — Encounter: Payer: Self-pay | Admitting: Ophthalmology

## 2019-12-17 ENCOUNTER — Other Ambulatory Visit
Admission: RE | Admit: 2019-12-17 | Discharge: 2019-12-17 | Disposition: A | Discharge status: Home / Self Care | Payer: Medicare Other | Source: Ambulatory Visit | Attending: Ophthalmology | Admitting: Ophthalmology

## 2019-12-17 DIAGNOSIS — Z01812 Encounter for preprocedural laboratory examination: Secondary | ICD-10-CM | POA: Diagnosis present

## 2019-12-17 DIAGNOSIS — Z20822 Contact with and (suspected) exposure to covid-19: Secondary | ICD-10-CM | POA: Diagnosis not present

## 2019-12-18 LAB — SARS CORONAVIRUS 2 (TAT 6-24 HRS): SARS Coronavirus 2: NEGATIVE

## 2019-12-20 NOTE — Discharge Instructions (Signed)

## 2019-12-21 ENCOUNTER — Encounter: Payer: Self-pay | Admitting: Ophthalmology

## 2019-12-21 ENCOUNTER — Ambulatory Visit: Payer: Medicare Other | Admitting: Anesthesiology

## 2019-12-21 ENCOUNTER — Ambulatory Visit
Admission: RE | Admit: 2019-12-21 | Discharge: 2019-12-21 | Disposition: A | Discharge status: Home / Self Care | Payer: Medicare Other | Source: Ambulatory Visit | Attending: Ophthalmology | Admitting: Ophthalmology

## 2019-12-21 ENCOUNTER — Encounter
Admission: RE | Disposition: A | Discharge status: Home / Self Care | Payer: Self-pay | Source: Ambulatory Visit | Attending: Ophthalmology

## 2019-12-21 DIAGNOSIS — Z87891 Personal history of nicotine dependence: Secondary | ICD-10-CM | POA: Diagnosis not present

## 2019-12-21 DIAGNOSIS — F419 Anxiety disorder, unspecified: Secondary | ICD-10-CM | POA: Insufficient documentation

## 2019-12-21 DIAGNOSIS — K219 Gastro-esophageal reflux disease without esophagitis: Secondary | ICD-10-CM | POA: Insufficient documentation

## 2019-12-21 DIAGNOSIS — H2511 Age-related nuclear cataract, right eye: Secondary | ICD-10-CM | POA: Insufficient documentation

## 2019-12-21 DIAGNOSIS — Z881 Allergy status to other antibiotic agents status: Secondary | ICD-10-CM | POA: Diagnosis not present

## 2019-12-21 HISTORY — PX: CATARACT EXTRACTION W/PHACO: SHX586

## 2019-12-21 HISTORY — DX: Presence of dental prosthetic device (complete) (partial): Z97.2

## 2019-12-21 SURGERY — PHACOEMULSIFICATION, CATARACT, WITH IOL INSERTION
Anesthesia: Monitor Anesthesia Care | Laterality: Right

## 2019-12-21 MED ORDER — LIDOCAINE HCL (PF) 2 % IJ SOLN
INTRAOCULAR | Status: DC | PRN
  Administered 2019-12-21: 2 mL

## 2019-12-21 MED ORDER — TETRACAINE HCL 0.5 % OP SOLN
1.0000 [drp] | OPHTHALMIC | Status: DC | PRN
  Administered 2019-12-21 (×3): 1 [drp] via OPHTHALMIC

## 2019-12-21 MED ORDER — BRIMONIDINE TARTRATE-TIMOLOL 0.2-0.5 % OP SOLN
OPHTHALMIC | Status: DC | PRN
  Administered 2019-12-21: 1 [drp] via OPHTHALMIC

## 2019-12-21 MED ORDER — MIDAZOLAM HCL 2 MG/2ML IJ SOLN
INTRAMUSCULAR | Status: DC | PRN
  Administered 2019-12-21: 1 mg via INTRAVENOUS

## 2019-12-21 MED ORDER — NA CHONDROIT SULF-NA HYALURON 40-17 MG/ML IO SOLN
INTRAOCULAR | Status: DC | PRN
  Administered 2019-12-21: 1 mL via INTRAOCULAR

## 2019-12-21 MED ORDER — FENTANYL CITRATE (PF) 100 MCG/2ML IJ SOLN
INTRAMUSCULAR | Status: DC | PRN
  Administered 2019-12-21: 50 ug via INTRAVENOUS

## 2019-12-21 MED ORDER — EPINEPHRINE PF 1 MG/ML IJ SOLN
INTRAOCULAR | Status: DC | PRN
  Administered 2019-12-21: 49 mL via OPHTHALMIC

## 2019-12-21 MED ORDER — CEFUROXIME OPHTHALMIC INJECTION 1 MG/0.1 ML
INJECTION | OPHTHALMIC | Status: DC | PRN
  Administered 2019-12-21: 0.1 mL via OPHTHALMIC

## 2019-12-21 MED ORDER — ARMC OPHTHALMIC DILATING DROPS
1.0000 "application " | OPHTHALMIC | Status: DC | PRN
  Administered 2019-12-21 (×3): 1 via OPHTHALMIC

## 2019-12-21 SURGICAL SUPPLY — 19 items
CANNULA ANT/CHMB 27G (MISCELLANEOUS) ×2 IMPLANT
CANNULA ANT/CHMB 27GA (MISCELLANEOUS) ×6 IMPLANT
GLOVE SURG LX 8.0 MICRO (GLOVE) ×2
GLOVE SURG LX STRL 8.0 MICRO (GLOVE) ×1 IMPLANT
GLOVE SURG TRIUMPH 8.0 PF LTX (GLOVE) ×3 IMPLANT
GOWN STRL REUS W/ TWL LRG LVL3 (GOWN DISPOSABLE) ×2 IMPLANT
GOWN STRL REUS W/TWL LRG LVL3 (GOWN DISPOSABLE) ×6
LENS IOL TECNIS EYHANCE 20.0 ×2 IMPLANT
MARKER SKIN DUAL TIP RULER LAB (MISCELLANEOUS) ×3 IMPLANT
NDL FILTER BLUNT 18X1 1/2 (NEEDLE) ×1 IMPLANT
NEEDLE FILTER BLUNT 18X 1/2SAF (NEEDLE) ×2
NEEDLE FILTER BLUNT 18X1 1/2 (NEEDLE) ×1 IMPLANT
PACK EYE AFTER SURG (MISCELLANEOUS) ×3 IMPLANT
PACK OPTHALMIC (MISCELLANEOUS) ×3 IMPLANT
PACK PORFILIO (MISCELLANEOUS) ×3 IMPLANT
SYR 3ML LL SCALE MARK (SYRINGE) ×3 IMPLANT
SYR TB 1ML LUER SLIP (SYRINGE) ×3 IMPLANT
WATER STERILE IRR 250ML POUR (IV SOLUTION) ×3 IMPLANT
WIPE NON LINTING 3.25X3.25 (MISCELLANEOUS) ×3 IMPLANT

## 2019-12-21 NOTE — Anesthesia Preprocedure Evaluation (Signed)
Anesthesia Evaluation  Patient identified by MRN, date of birth, ID band Patient awake    History of Anesthesia Complications Negative for: history of anesthetic complications  Airway Mallampati: III  TM Distance: >3 FB Neck ROM: Full    Dental  (+) Upper Dentures   Pulmonary neg pulmonary ROS, former smoker,    Pulmonary exam normal        Cardiovascular negative cardio ROS Normal cardiovascular exam     Neuro/Psych    GI/Hepatic Neg liver ROS, GERD  Controlled and Medicated,  Endo/Other  negative endocrine ROS  Renal/GU negative Renal ROS     Musculoskeletal   Abdominal   Peds  Hematology negative hematology ROS (+)   Anesthesia Other Findings   Reproductive/Obstetrics                             Anesthesia Physical Anesthesia Plan  ASA: II  Anesthesia Plan: MAC   Post-op Pain Management:    Induction: Intravenous  PONV Risk Score and Plan: 2 and Treatment may vary due to age or medical condition and Midazolam  Airway Management Planned: Nasal Cannula and Natural Airway  Additional Equipment: None  Intra-op Plan:   Post-operative Plan:   Informed Consent: I have reviewed the patients History and Physical, chart, labs and discussed the procedure including the risks, benefits and alternatives for the proposed anesthesia with the patient or authorized representative who has indicated his/her understanding and acceptance.       Plan Discussed with: CRNA  Anesthesia Plan Comments:         Anesthesia Quick Evaluation

## 2019-12-21 NOTE — H&P (Signed)
All labs reviewed. Abnormal studies sent to patients PCP when indicated.  Previous H&P reviewed, patient examined, there are NO CHANGES.  Claudia Gutierrez Porfilio9/21/20219:22 AM

## 2019-12-21 NOTE — Transfer of Care (Signed)
Immediate Anesthesia Transfer of Care Note  Patient: Claudia Gutierrez  Procedure(s) Performed: CATARACT EXTRACTION PHACO AND INTRAOCULAR LENS PLACEMENT (IOC) RIGHT (Right )  Patient Location: PACU  Anesthesia Type: MAC  Level of Consciousness: awake, alert  and patient cooperative  Airway and Oxygen Therapy: Patient Spontanous Breathing and Patient connected to supplemental oxygen  Post-op Assessment: Post-op Vital signs reviewed, Patient's Cardiovascular Status Stable, Respiratory Function Stable, Patent Airway and No signs of Nausea or vomiting  Post-op Vital Signs: Reviewed and stable  Complications: No complications documented.

## 2019-12-21 NOTE — Anesthesia Postprocedure Evaluation (Signed)
Anesthesia Post Note  Patient: Claudia Gutierrez  Procedure(s) Performed: CATARACT EXTRACTION PHACO AND INTRAOCULAR LENS PLACEMENT (IOC) RIGHT 4.15 00:31.2 (Right )     Patient location during evaluation: PACU Anesthesia Type: MAC Level of consciousness: awake and alert Pain management: pain level controlled Vital Signs Assessment: post-procedure vital signs reviewed and stable Respiratory status: spontaneous breathing Cardiovascular status: blood pressure returned to baseline Postop Assessment: no apparent nausea or vomiting, adequate PO intake and no headache Anesthetic complications: no   No complications documented.  Adele Barthel [facility]

## 2019-12-21 NOTE — Anesthesia Procedure Notes (Signed)
Procedure Name: MAC Date/Time: 12/21/2019 9:32 AM Performed by: Silvana Newness, CRNA Pre-anesthesia Checklist: Patient identified, Emergency Drugs available, Suction available, Patient being monitored and Timeout performed Patient Re-evaluated:Patient Re-evaluated prior to induction Oxygen Delivery Method: Nasal cannula Placement Confirmation: positive ETCO2

## 2019-12-21 NOTE — Op Note (Signed)
PREOPERATIVE DIAGNOSIS:  Nuclear sclerotic cataract of the right eye.   POSTOPERATIVE DIAGNOSIS:  H25.11 Cataract   OPERATIVE PROCEDURE:@   SURGEON:  Birder Robson, MD.   ANESTHESIA:  Anesthesiologist: Page, Adele Barthel, MD CRNA: Mayme Genta, CRNA; Silvana Newness, CRNA  1.      Managed anesthesia care. 2.      0.80ml of Shugarcaine was instilled in the eye following the paracentesis.   COMPLICATIONS:  None.   TECHNIQUE:   Stop and chop   DESCRIPTION OF PROCEDURE:  The patient was examined and consented in the preoperative holding area where the aforementioned topical anesthesia was applied to the right eye and then brought back to the Operating Room where the right eye was prepped and draped in the usual sterile ophthalmic fashion and a lid speculum was placed. A paracentesis was created with the side port blade and the anterior chamber was filled with viscoelastic. A near clear corneal incision was performed with the steel keratome. A continuous curvilinear capsulorrhexis was performed with a cystotome followed by the capsulorrhexis forceps. Hydrodissection and hydrodelineation were carried out with BSS on a blunt cannula. The lens was removed in a stop and chop  technique and the remaining cortical material was removed with the irrigation-aspiration handpiece. The capsular bag was inflated with viscoelastic and the Technis [id number]  lens was placed in the capsular bag without complication. The remaining viscoelastic was removed from the eye with the irrigation-aspiration handpiece. The wounds were hydrated. The anterior chamber was flushed with BSS and the eye was inflated to physiologic pressure. 0.99ml of Vigamox was placed in the anterior chamber. The wounds were found to be water tight. The eye was dressed with Combigan. The patient was given protective glasses to wear throughout the day and a shield with which to sleep tonight. The patient was also given drops with which to begin a drop  regimen today and will follow-up with me in one day. Implant Name Type Inv. Item Serial No. Manufacturer Lot No. LRB No. Used Action  LENS II EYHANCE 20.0 - [id number]  LENS II EYHANCE 20.0 [id number] JOHNSON   Right 1 Implanted   Procedure(s): CATARACT EXTRACTION PHACO AND INTRAOCULAR LENS PLACEMENT (IOC) RIGHT 4.15 00:31.2 (Right)  Electronically signed: Birder Robson 12/21/2019 9:47 AM

## 2019-12-22 ENCOUNTER — Encounter: Payer: Self-pay | Admitting: Ophthalmology

## 2019-12-28 ENCOUNTER — Other Ambulatory Visit: Payer: Self-pay | Admitting: Internal Medicine

## 2019-12-28 DIAGNOSIS — F325 Major depressive disorder, single episode, in full remission: Secondary | ICD-10-CM

## 2019-12-31 ENCOUNTER — Encounter: Payer: Self-pay | Admitting: Ophthalmology

## 2020-01-07 ENCOUNTER — Other Ambulatory Visit
Admission: RE | Admit: 2020-01-07 | Discharge: 2020-01-07 | Disposition: A | Discharge status: Home / Self Care | Payer: Medicare Other | Source: Ambulatory Visit | Attending: Ophthalmology | Admitting: Ophthalmology

## 2020-01-07 ENCOUNTER — Other Ambulatory Visit: Payer: Self-pay

## 2020-01-07 DIAGNOSIS — Z01812 Encounter for preprocedural laboratory examination: Secondary | ICD-10-CM | POA: Diagnosis present

## 2020-01-07 DIAGNOSIS — Z20822 Contact with and (suspected) exposure to covid-19: Secondary | ICD-10-CM | POA: Insufficient documentation

## 2020-01-07 LAB — SARS CORONAVIRUS 2 (TAT 6-24 HRS): SARS Coronavirus 2: NEGATIVE

## 2020-01-10 NOTE — Discharge Instructions (Signed)

## 2020-01-11 ENCOUNTER — Ambulatory Visit: Payer: Medicare Other | Admitting: Anesthesiology

## 2020-01-11 ENCOUNTER — Encounter
Admission: RE | Disposition: A | Discharge status: Home / Self Care | Payer: Self-pay | Source: Home / Self Care | Attending: Ophthalmology

## 2020-01-11 ENCOUNTER — Ambulatory Visit
Admission: RE | Admit: 2020-01-11 | Discharge: 2020-01-11 | Disposition: A | Discharge status: Home / Self Care | Payer: Medicare Other | Attending: Ophthalmology | Admitting: Ophthalmology

## 2020-01-11 ENCOUNTER — Encounter: Payer: Self-pay | Admitting: Ophthalmology

## 2020-01-11 ENCOUNTER — Other Ambulatory Visit: Payer: Self-pay

## 2020-01-11 DIAGNOSIS — Z87891 Personal history of nicotine dependence: Secondary | ICD-10-CM | POA: Diagnosis not present

## 2020-01-11 DIAGNOSIS — F419 Anxiety disorder, unspecified: Secondary | ICD-10-CM | POA: Insufficient documentation

## 2020-01-11 DIAGNOSIS — Z888 Allergy status to other drugs, medicaments and biological substances status: Secondary | ICD-10-CM | POA: Insufficient documentation

## 2020-01-11 DIAGNOSIS — Z881 Allergy status to other antibiotic agents status: Secondary | ICD-10-CM | POA: Insufficient documentation

## 2020-01-11 DIAGNOSIS — Z79899 Other long term (current) drug therapy: Secondary | ICD-10-CM | POA: Diagnosis not present

## 2020-01-11 DIAGNOSIS — H2512 Age-related nuclear cataract, left eye: Secondary | ICD-10-CM | POA: Insufficient documentation

## 2020-01-11 DIAGNOSIS — Z885 Allergy status to narcotic agent status: Secondary | ICD-10-CM | POA: Diagnosis not present

## 2020-01-11 DIAGNOSIS — K219 Gastro-esophageal reflux disease without esophagitis: Secondary | ICD-10-CM | POA: Insufficient documentation

## 2020-01-11 HISTORY — PX: CATARACT EXTRACTION W/PHACO: SHX586

## 2020-01-11 SURGERY — PHACOEMULSIFICATION, CATARACT, WITH IOL INSERTION
Anesthesia: Monitor Anesthesia Care | Site: Eye | Laterality: Left

## 2020-01-11 MED ORDER — LIDOCAINE HCL (PF) 2 % IJ SOLN
INTRAOCULAR | Status: DC | PRN
  Administered 2020-01-11: 2 mL

## 2020-01-11 MED ORDER — TETRACAINE HCL 0.5 % OP SOLN
1.0000 [drp] | OPHTHALMIC | Status: DC | PRN
  Administered 2020-01-11 (×3): 1 [drp] via OPHTHALMIC

## 2020-01-11 MED ORDER — EPINEPHRINE PF 1 MG/ML IJ SOLN
INTRAOCULAR | Status: DC | PRN
  Administered 2020-01-11: 47 mL via OPHTHALMIC

## 2020-01-11 MED ORDER — ACETAMINOPHEN 10 MG/ML IV SOLN: 1000.0000 mg | Freq: Once | INTRAVENOUS | Status: DC | PRN

## 2020-01-11 MED ORDER — LACTATED RINGERS IV SOLN: INTRAVENOUS | Status: DC

## 2020-01-11 MED ORDER — ONDANSETRON HCL 4 MG/2ML IJ SOLN: 4.0000 mg | Freq: Once | INTRAMUSCULAR | Status: DC | PRN

## 2020-01-11 MED ORDER — ARMC OPHTHALMIC DILATING DROPS
1.0000 "application " | OPHTHALMIC | Status: DC | PRN
  Administered 2020-01-11 (×3): 1 via OPHTHALMIC

## 2020-01-11 MED ORDER — MIDAZOLAM HCL 2 MG/2ML IJ SOLN
INTRAMUSCULAR | Status: DC | PRN
  Administered 2020-01-11: 1 mg via INTRAVENOUS

## 2020-01-11 MED ORDER — FENTANYL CITRATE (PF) 100 MCG/2ML IJ SOLN
INTRAMUSCULAR | Status: DC | PRN
  Administered 2020-01-11: 50 ug via INTRAVENOUS

## 2020-01-11 MED ORDER — CEFUROXIME OPHTHALMIC INJECTION 1 MG/0.1 ML
INJECTION | OPHTHALMIC | Status: DC | PRN
  Administered 2020-01-11: 0.1 mL via INTRACAMERAL

## 2020-01-11 MED ORDER — NA CHONDROIT SULF-NA HYALURON 40-17 MG/ML IO SOLN
INTRAOCULAR | Status: DC | PRN
  Administered 2020-01-11: 1 mL via INTRAOCULAR

## 2020-01-11 MED ORDER — BRIMONIDINE TARTRATE-TIMOLOL 0.2-0.5 % OP SOLN
OPHTHALMIC | Status: DC | PRN
  Administered 2020-01-11: 1 [drp] via OPHTHALMIC

## 2020-01-11 SURGICAL SUPPLY — 19 items
CANNULA ANT/CHMB 27G (MISCELLANEOUS) ×2 IMPLANT
CANNULA ANT/CHMB 27GA (MISCELLANEOUS) ×6 IMPLANT
GLOVE SURG LX 8.0 MICRO (GLOVE) ×2
GLOVE SURG LX STRL 8.0 MICRO (GLOVE) ×1 IMPLANT
GLOVE SURG TRIUMPH 8.0 PF LTX (GLOVE) ×3 IMPLANT
GOWN STRL REUS W/ TWL LRG LVL3 (GOWN DISPOSABLE) ×2 IMPLANT
GOWN STRL REUS W/TWL LRG LVL3 (GOWN DISPOSABLE) ×6
LENS IOL TECNIS EYHANCE 21.0 (Intraocular Lens) ×2 IMPLANT
MARKER SKIN DUAL TIP RULER LAB (MISCELLANEOUS) ×3 IMPLANT
NDL FILTER BLUNT 18X1 1/2 (NEEDLE) ×1 IMPLANT
NEEDLE FILTER BLUNT 18X 1/2SAF (NEEDLE) ×2
NEEDLE FILTER BLUNT 18X1 1/2 (NEEDLE) ×1 IMPLANT
PACK EYE AFTER SURG (MISCELLANEOUS) ×3 IMPLANT
PACK OPTHALMIC (MISCELLANEOUS) ×3 IMPLANT
PACK PORFILIO (MISCELLANEOUS) ×3 IMPLANT
SYR 3ML LL SCALE MARK (SYRINGE) ×3 IMPLANT
SYR TB 1ML LUER SLIP (SYRINGE) ×3 IMPLANT
WATER STERILE IRR 250ML POUR (IV SOLUTION) ×3 IMPLANT
WIPE NON LINTING 3.25X3.25 (MISCELLANEOUS) ×3 IMPLANT

## 2020-01-11 NOTE — Anesthesia Preprocedure Evaluation (Signed)
Anesthesia Evaluation  Patient identified by MRN, date of birth, ID band Patient awake    History of Anesthesia Complications Negative for: history of anesthetic complications  Airway Mallampati: III  TM Distance: >3 FB Neck ROM: Full    Dental  (+) Upper Dentures   Pulmonary former smoker,    Pulmonary exam normal        Cardiovascular Normal cardiovascular exam     Neuro/Psych PSYCHIATRIC DISORDERS Anxiety Depression    GI/Hepatic GERD  Controlled and Medicated,  Endo/Other    Renal/GU      Musculoskeletal   Abdominal   Peds  Hematology   Anesthesia Other Findings   Reproductive/Obstetrics                             Anesthesia Physical  Anesthesia Plan  ASA: II  Anesthesia Plan: MAC   Post-op Pain Management:    Induction: Intravenous  PONV Risk Score and Plan: 2 and TIVA, Midazolam and Treatment may vary due to age or medical condition  Airway Management Planned: Nasal Cannula  Additional Equipment:   Intra-op Plan:   Post-operative Plan:   Informed Consent: I have reviewed the patients History and Physical, chart, labs and discussed the procedure including the risks, benefits and alternatives for the proposed anesthesia with the patient or authorized representative who has indicated his/her understanding and acceptance.       Plan Discussed with: CRNA and Anesthesiologist  Anesthesia Plan Comments:        Anesthesia Quick Evaluation

## 2020-01-11 NOTE — Anesthesia Postprocedure Evaluation (Signed)
Anesthesia Post Note  Patient: Claudia Gutierrez  Procedure(s) Performed: CATARACT EXTRACTION PHACO AND INTRAOCULAR LENS PLACEMENT (IOC) LEFT 4.78 00:26.5 (Left Eye)     Patient location during evaluation: PACU Anesthesia Type: MAC Level of consciousness: awake and alert Pain management: pain level controlled Vital Signs Assessment: post-procedure vital signs reviewed and stable Respiratory status: spontaneous breathing, nonlabored ventilation, respiratory function stable and patient connected to nasal cannula oxygen Cardiovascular status: stable and blood pressure returned to baseline Postop Assessment: no apparent nausea or vomiting Anesthetic complications: no   No complications documented.  Harding Thomure A  Miliano Cotten

## 2020-01-11 NOTE — H&P (Signed)
[facility]   Primary Care Physician:  Glean Hess, MD Ophthalmologist: Dr. George Ina  Pre-Procedure History & Physical: HPI:  Claudia Gutierrez is a 69 y.o. female here for cataract surgery.   Past Medical History:  Diagnosis Date  . Acid reflux   . Anxiety   . Wears dentures    Full upper    Past Surgical History:  Procedure Laterality Date  . ACHILLES TENDON REPAIR  2008  . BIOPSY  10/21/2019   Procedure: BIOPSY;  Surgeon: Milus Banister, MD;  Location: [facility];  Service: Endoscopy;;  . BREAST CYST ASPIRATION Right 2010  . CATARACT EXTRACTION W/PHACO Right 12/21/2019   Procedure: CATARACT EXTRACTION PHACO AND INTRAOCULAR LENS PLACEMENT (IOC) RIGHT 4.15 00:31.2;  Surgeon: Birder Robson, MD;  Location: [place];  Service: Ophthalmology;  Laterality: Right;  . COLONOSCOPY WITH PROPOFOL N/A 09/13/2019   Procedure: COLONOSCOPY WITH PROPOFOL;  Surgeon: Jonathon Bellows, MD;  Location: [facility];  Service: Gastroenterology;  Laterality: N/A;  . ESOPHAGOGASTRODUODENOSCOPY (EGD) WITH PROPOFOL N/A 10/21/2019   Procedure: ESOPHAGOGASTRODUODENOSCOPY (EGD) WITH PROPOFOL;  Surgeon: Milus Banister, MD;  Location: [facility];  Service: Endoscopy;  Laterality: N/A;  . EUS N/A 10/21/2019   Procedure: UPPER ENDOSCOPIC ULTRASOUND (EUS) RADIAL;  Surgeon: Milus Banister, MD;  Location: [facility];  Service: Endoscopy;  Laterality: N/A;  . FINE NEEDLE ASPIRATION N/A 10/21/2019   Procedure: FINE NEEDLE ASPIRATION (FNA) LINEAR;  Surgeon: Milus Banister, MD;  Location: [facility];  Service: Endoscopy;  Laterality: N/A;    Prior to Admission medications   Medication Sig Start Date End Date Taking? Authorizing Provider  acetaminophen (TYLENOL) 500 MG tablet Take 500-1,000 mg by mouth 2 (two) times daily as needed for moderate pain or headache.   Yes [provider]  citalopram (CELEXA) 20 MG tablet Take 1 tablet (20 mg total) by mouth daily. 11/22/19  Yes  Glean Hess, MD  folic acid (FOLVITE) 1 MG tablet Take 1 mg by mouth daily.   Yes [provider]  Multiple Vitamins-Minerals (HAIR SKIN AND NAILS FORMULA) TABS Take 1 tablet by mouth daily.   Yes [provider]  Omega-3 Fatty Acids (FISH OIL) 1000 MG CAPS Take 1,000 mg by mouth daily.   Yes [provider]  omeprazole (PRILOSEC) 20 MG capsule Take 20 mg by mouth daily. 06/26/19  Yes [provider]  OVER THE COUNTER MEDICATION Take 250 mg by mouth daily. Keratin otc supplement   Yes [provider]  traZODone (DESYREL) 100 MG tablet TAKE 1 TABLET BY MOUTH AT BEDTIME AS NEEDED FOR SLEEP 12/28/19  Yes Glean Hess, MD  cycloSPORINE (RESTASIS) 0.05 % ophthalmic emulsion Place 1 drop into both eyes 2 (two) times daily as needed (dry eyes).    [provider]  meloxicam (MOBIC) 15 MG tablet Take 1 tablet (15 mg total) by mouth daily. Patient not taking: Reported on 12/16/2019 10/05/19   Glean Hess, MD    Allergies as of 12/23/2019 - Review Complete 12/21/2019  Allergen Reaction Noted  . Azithromycin Other (See Comments) 03/05/2013  . Paroxetine hcl Nausea Only and Other (See Comments) 08/11/2019  . Ciprofloxacin Itching 10/21/2019  . Codeine Nausea And Vomiting 08/09/2019    Family History  Problem Relation Age of Onset  . Breast cancer Sister 57       and stomach ca  . Diabetes Sister   . Lung cancer Father   . Diabetes Father   . Heart  disease Father   . Hypertension Father   . Stroke Paternal Grandmother     Social History   Socioeconomic History  . Marital status: Single    Spouse name: Not on file  . Number of children: Not on file  . Years of education: Not on file  . Highest education level: Not on file  Occupational History  . Not on file  Tobacco Use  . Smoking status: Former Smoker    Years: 10.00    Quit date: 2002    Years since quitting: 19.7  . Smokeless tobacco: Never Used  Vaping Use  .  Vaping Use: Never used  Substance and Sexual Activity  . Alcohol use: Yes    Comment: socially  . Drug use: Never  . Sexual activity: Not Currently  Other Topics Concern  . Not on file  Social History Narrative  . Not on file   Social Determinants of Health   Financial Resource Strain:   . Difficulty of Paying Living Expenses: Not on file  Food Insecurity:   . Worried About Charity fundraiser in the Last Year: Not on file  . Ran Out of Food in the Last Year: Not on file  Transportation Needs:   . Lack of Transportation (Medical): Not on file  . Lack of Transportation (Non-Medical): Not on file  Physical Activity:   . Days of Exercise per Week: Not on file  . Minutes of Exercise per Session: Not on file  Stress:   . Feeling of Stress : Not on file  Social Connections:   . Frequency of Communication with Friends and Family: Not on file  . Frequency of Social Gatherings with Friends and Family: Not on file  . Attends Religious Services: Not on file  . Active Member of Clubs or Organizations: Not on file  . Attends Archivist Meetings: Not on file  . Marital Status: Not on file  Intimate Partner Violence:   . Fear of Current or Ex-Partner: Not on file  . Emotionally Abused: Not on file  . Physically Abused: Not on file  . Sexually Abused: Not on file    Review of Systems: See HPI, otherwise negative ROS  Physical Exam: BP 132/64   Pulse (!) 59   Temp (!) 97.5 F (36.4 C) (Temporal)   Resp 16   Ht 5\' 6"  (1.676 m)   Wt 71.6 kg   SpO2 99%   BMI 25.47 kg/m  General:   Alert,  pleasant and cooperative in NAD Head:  Normocephalic and atraumatic. Lungs:  Clear to auscultation.    Heart:  Regular rate and rhythm.   Impression/Plan: Claudia Gutierrez is here for cataract surgery.  Risks, benefits, limitations, and alternatives regarding cataract surgery have been reviewed with the patient.  Questions have been answered.  All parties agreeable.   Birder Robson, MD  01/11/2020, 12:04 PM

## 2020-01-11 NOTE — Transfer of Care (Signed)
Immediate Anesthesia Transfer of Care Note  Patient: Claudia Gutierrez  Procedure(s) Performed: CATARACT EXTRACTION PHACO AND INTRAOCULAR LENS PLACEMENT (IOC) LEFT 4.78 00:26.5 (Left Eye)  Patient Location: PACU  Anesthesia Type: MAC  Level of Consciousness: awake, alert  and patient cooperative  Airway and Oxygen Therapy: Patient Spontanous Breathing and Patient connected to supplemental oxygen  Post-op Assessment: Post-op Vital signs reviewed, Patient's Cardiovascular Status Stable, Respiratory Function Stable, Patent Airway and No signs of Nausea or vomiting  Post-op Vital Signs: Reviewed and stable  Complications: No complications documented.

## 2020-01-11 NOTE — Op Note (Signed)
PREOPERATIVE DIAGNOSIS:  Nuclear sclerotic cataract of the left eye.   POSTOPERATIVE DIAGNOSIS:  Cataract   OPERATIVE PROCEDURE:  Procedure(s): CATARACT EXTRACTION PHACO AND INTRAOCULAR LENS PLACEMENT (IOC) LEFT 4.78 00:26.5   SURGEON:  Birder Robson, MD.   ANESTHESIA:   Anesthesiologist: Heniser, Fredric Dine, MD CRNA: Cameron Ali, CRNA  1.      Managed anesthesia care. 2.      Topical tetracaine drops followed by 2% Xylocaine jelly applied in the preoperative holding area.   COMPLICATIONS:  None.   TECHNIQUE:   Stop and chop   DESCRIPTION OF PROCEDURE:  The patient was examined and consented in the preoperative holding area where the aforementioned topical anesthesia was applied to the left eye and then brought back to the Operating Room where the left eye was prepped and draped in the usual sterile ophthalmic fashion and a lid speculum was placed. A paracentesis was created with the side port blade and the anterior chamber was filled with viscoelastic. A near clear corneal incision was performed with the steel keratome. A continuous curvilinear capsulorrhexis was performed with a cystotome followed by the capsulorrhexis forceps. Hydrodissection and hydrodelineation were carried out with BSS on a blunt cannula. The lens was removed in a stop and chop  technique and the remaining cortical material was removed with the irrigation-aspiration handpiece. The capsular bag was inflated with viscoelastic and the Technis DIU lens was placed in the capsular bag without complication. The remaining viscoelastic was removed from the eye with the irrigation-aspiration handpiece. The wounds were hydrated. The anterior chamber was flushed with BSS and the eye was inflated to physiologic pressure. 0.1 mL of cefuroxime concentration 10 mg/mL was placed in the anterior chamber. The wounds were found to be water tight. The eye was dressed with Combigan. The patient was given protective glasses to wear throughout  the day and a shield with which to sleep tonight. The patient was also given drops with which to begin a drop regimen today and will follow-up with me in one day. Implant Name Type Inv. Item Serial No. Manufacturer Lot No. LRB No. Used Action  LENS II EYHANCE 21.0 - [id number] Intraocular Lens LENS II EYHANCE 21.0 [id number] JOHNSON   Left 1 Implanted   Procedure(s): CATARACT EXTRACTION PHACO AND INTRAOCULAR LENS PLACEMENT (IOC) LEFT 4.78 00:26.5 (Left)  Electronically signed: Birder Robson 01/11/2020 12:28 PM

## 2020-01-11 NOTE — Anesthesia Procedure Notes (Signed)
Procedure Name: MAC Date/Time: 01/11/2020 12:13 PM Performed by: Cameron Ali, CRNA Pre-anesthesia Checklist: Patient identified, Emergency Drugs available, Suction available, Timeout performed and Patient being monitored Patient Re-evaluated:Patient Re-evaluated prior to induction Oxygen Delivery Method: Nasal cannula Placement Confirmation: positive ETCO2

## 2020-01-12 ENCOUNTER — Encounter: Payer: Self-pay | Admitting: Ophthalmology

## 2020-01-13 ENCOUNTER — Other Ambulatory Visit: Payer: Self-pay | Admitting: Internal Medicine

## 2020-01-13 NOTE — Telephone Encounter (Signed)
Medication Refill - Medication: Omeprazole 20mg   Has the patient contacted their pharmacy? No. (Agent: If no, request that the patient contact the pharmacy for the refill.) (Agent: If yes, when and what did the pharmacy advise?)  Preferred Pharmacy (with phone number or street name):[facility] [place], [place]: Please be advised that RX refills may take up to 3 business days. We ask that you follow-up with your pharmacy.

## 2020-01-13 NOTE — Telephone Encounter (Signed)
Requested medication (s) are due for refill today: yes  Requested medication (s) are on the active medication list: yes  Last refill:  06/26/19  historic provider  Future visit scheduled: yes  Notes to clinic: last ordered by GI    Requested Prescriptions  Pending Prescriptions Disp Refills   omeprazole (PRILOSEC) 20 MG capsule      Sig: Take 1 capsule (20 mg total) by mouth daily.      Gastroenterology: Proton Pump Inhibitors Passed - 01/13/2020 12:08 PM      Passed - Valid encounter within last 12 months    Recent Outpatient Visits           3 months ago Sacro-iliac pain   [place], MD       Future Appointments             In 3 weeks [facility] Melia Jesse Sans, MD [facility], [facility]

## 2020-01-14 MED ORDER — OMEPRAZOLE 20 MG PO CPDR
20.0000 mg | DELAYED_RELEASE_CAPSULE | Freq: Every day | ORAL | 0 refills | Status: DC
Start: 2020-01-14 — End: 2021-02-02

## 2020-02-07 ENCOUNTER — Encounter: Payer: Self-pay | Admitting: Internal Medicine

## 2020-02-07 ENCOUNTER — Other Ambulatory Visit: Payer: Self-pay | Admitting: *Deleted

## 2020-02-07 ENCOUNTER — Inpatient Hospital Stay
Admission: RE | Admit: 2020-02-07 | Discharge: 2020-02-07 | Disposition: A | Discharge status: Home / Self Care | Payer: Self-pay | Source: Ambulatory Visit | Attending: *Deleted | Admitting: *Deleted

## 2020-02-07 ENCOUNTER — Ambulatory Visit (INDEPENDENT_AMBULATORY_CARE_PROVIDER_SITE_OTHER): Payer: Medicare Other | Admitting: Internal Medicine

## 2020-02-07 ENCOUNTER — Other Ambulatory Visit: Payer: Self-pay

## 2020-02-07 VITALS — BP 124/78 | HR 60 | Temp 98.0°F | Ht 66.0 in | Wt 157.0 lb

## 2020-02-07 DIAGNOSIS — Z23 Encounter for immunization: Secondary | ICD-10-CM

## 2020-02-07 DIAGNOSIS — F325 Major depressive disorder, single episode, in full remission: Secondary | ICD-10-CM | POA: Diagnosis not present

## 2020-02-07 DIAGNOSIS — Z1231 Encounter for screening mammogram for malignant neoplasm of breast: Secondary | ICD-10-CM

## 2020-02-07 DIAGNOSIS — K219 Gastro-esophageal reflux disease without esophagitis: Secondary | ICD-10-CM

## 2020-02-07 DIAGNOSIS — F5101 Primary insomnia: Secondary | ICD-10-CM

## 2020-02-07 DIAGNOSIS — Z0000 Encounter for general adult medical examination without abnormal findings: Secondary | ICD-10-CM

## 2020-02-07 MED ORDER — SHINGRIX 50 MCG/0.5ML IM SUSR: 0.5000 mL | Freq: Once | INTRAMUSCULAR | 1 refills | Status: AC

## 2020-02-07 NOTE — Progress Notes (Signed)
Date:  02/07/2020   Name:  Claudia Gutierrez   DOB:  26-Dec-1950   MRN:  [id number]   Chief Complaint: Annual Exam (Breast Exam. No pap- discontinued. )  Claudia Gutierrez is a 69 y.o. female who presents today for her Complete Annual Exam. She feels well. She reports exercising - none. She reports she is sleeping well. Breast complaints- none.  Mammogram: 05/2019 @ [facility] DEXA: none Pap smear: discontinued Colonoscopy: 08/2019 - repeat 2031  Immunization History  Administered Date(s) Administered  . Fluad Quad(high Dose 65+) 02/07/2020  . Influenza, Seasonal, Injecte, Preservative Fre 01/13/2007, 01/08/2010, 01/13/2013  . Influenza,inj,Quad PF,6+ Mos 02/28/2017  . Influenza-Unspecified 12/30/2013  . Moderna SARS-COVID-2 Vaccination 05/15/2019, 06/11/2019  . Pneumococcal Conjugate-13 10/05/2019    Gastroesophageal Reflux She complains of heartburn. She reports no abdominal pain, no chest pain, no coughing or no wheezing. This is a recurrent problem. The problem occurs frequently. Pertinent negatives include no fatigue. She has tried a PPI for the symptoms. The treatment provided significant relief.  Depression        This is a chronic problem.  The problem has been resolved since onset.  Associated symptoms include insomnia.  Associated symptoms include no fatigue and no headaches.  Past treatments include SSRIs - Selective serotonin reuptake inhibitors.  Compliance with treatment is good.  Previous treatment provided significant relief. Insomnia Primary symptoms: sleep disturbance, difficulty falling asleep, frequent awakening.  The problem is unchanged. Past treatments include medication (trazodone). PMH includes: depression.    Lab Results  Component Value Date   CREATININE 0.91 08/09/2019   BUN 14 08/09/2019   NA 142 08/09/2019   K 4.2 08/09/2019   CL 109 08/09/2019   CO2 25 08/09/2019   No results found for: CHOL, HDL, LDLCALC, LDLDIRECT, TRIG, CHOLHDL No results found for:  TSH No results found for: HGBA1C Lab Results  Component Value Date   WBC 4.5 08/09/2019   HGB 12.2 08/09/2019   HCT 37.5 08/09/2019   MCV 93.1 08/09/2019   PLT 155 08/09/2019   Lab Results  Component Value Date   ALT 27 08/09/2019   AST 34 08/09/2019   ALKPHOS 52 08/09/2019   BILITOT 0.6 08/09/2019     Review of Systems  Constitutional: Negative for chills, fatigue and fever.  HENT: Negative for congestion, hearing loss, tinnitus, trouble swallowing and voice change.   Eyes: Negative for visual disturbance.  Respiratory: Negative for cough, chest tightness, shortness of breath and wheezing.   Cardiovascular: Negative for chest pain, palpitations and leg swelling.  Gastrointestinal: Positive for heartburn. Negative for abdominal pain, constipation, diarrhea and vomiting.  Endocrine: Negative for polydipsia and polyuria.  Genitourinary: Negative for dysuria, frequency, genital sores, vaginal bleeding and vaginal discharge.  Musculoskeletal: Positive for arthralgias (intermittent left foot pain). Negative for gait problem and joint swelling.  Skin: Negative for color change and rash.  Neurological: Negative for dizziness, tremors, light-headedness and headaches.  Hematological: Negative for adenopathy. Does not bruise/bleed easily.  Psychiatric/Behavioral: Positive for depression and sleep disturbance. Negative for dysphoric mood. The patient has insomnia. The patient is not nervous/anxious.     Patient Active Problem List   Diagnosis Date Noted  . History of colon polyps 10/05/2019  . Pancreatic cyst 10/05/2019  . Calculus of lower urinary tract 01/12/2014  . GERD (gastroesophageal reflux disease) 10/12/2013  . Major depressive disorder, single episode, unspecified 10/12/2013    Allergies  Allergen Reactions  . Azithromycin Other (See Comments)    SWELLING/EDEMA  .  Paroxetine Hcl Nausea Only and Other (See Comments)    GI Upset  . Ciprofloxacin Itching    Phlebitis     . Codeine Nausea And Vomiting    Past Surgical History:  Procedure Laterality Date  . ACHILLES TENDON REPAIR  2008  . BIOPSY  10/21/2019   Procedure: BIOPSY;  Surgeon: Milus Banister, MD;  Location: [facility];  Service: Endoscopy;;  . BREAST CYST ASPIRATION Right 2010  . CATARACT EXTRACTION W/PHACO Right 12/21/2019   Procedure: CATARACT EXTRACTION PHACO AND INTRAOCULAR LENS PLACEMENT (IOC) RIGHT 4.15 00:31.2;  Surgeon: Birder Robson, MD;  Location: [facility];  Service: Ophthalmology;  Laterality: Right;  . CATARACT EXTRACTION W/PHACO Left 01/11/2020   Procedure: CATARACT EXTRACTION PHACO AND INTRAOCULAR LENS PLACEMENT (IOC) LEFT 4.78 00:26.5;  Surgeon: Birder Robson, MD;  Location: [facility];  Service: Ophthalmology;  Laterality: Left;  . COLONOSCOPY WITH PROPOFOL N/A 09/13/2019   Procedure: COLONOSCOPY WITH PROPOFOL;  Surgeon: Jonathon Bellows, MD;  Location[facility];  Service: Gastroenterology;  Laterality: N/A;  . ESOPHAGOGASTRODUODENOSCOPY (EGD) WITH PROPOFOL N/A 10/21/2019   Procedure: ESOPHAGOGASTRODUODENOSCOPY (EGD) WITH PROPOFOL;  Surgeon: Milus Banister, MD;  Location: [facility];  Service: Endoscopy;  Laterality: N/A;  . EUS N/A 10/21/2019   Procedure: UPPER ENDOSCOPIC ULTRASOUND (EUS) RADIAL;  Surgeon: Milus Banister, MD;  Location: [facility];  Service: Endoscopy;  Laterality: N/A;  . FINE NEEDLE ASPIRATION N/A 10/21/2019   Procedure: FINE NEEDLE ASPIRATION (FNA) LINEAR;  Surgeon: Milus Banister, MD;  Location: [facility];  Service: Endoscopy;  Laterality: N/A;    Social History   Tobacco Use  . Smoking status: Former Smoker    Years: 10.00    Quit date: 2002    Years since quitting: 19.8  . Smokeless tobacco: Never Used  Vaping Use  . Vaping Use: Never used  Substance Use Topics  . Alcohol use: Yes    Comment: socially  . Drug use: Never     Medication list has been reviewed and updated.  Current Meds  Medication Sig   . citalopram (CELEXA) 20 MG tablet Take 1 tablet (20 mg total) by mouth daily.  . folic acid (FOLVITE) 1 MG tablet Take 1 mg by mouth daily.  . Multiple Vitamins-Minerals (HAIR SKIN AND NAILS FORMULA) TABS Take 1 tablet by mouth daily.  . Omega-3 Fatty Acids (FISH OIL) 1000 MG CAPS Take 1,000 mg by mouth daily.  Marland Kitchen omeprazole (PRILOSEC) 20 MG capsule Take 1 capsule (20 mg total) by mouth daily.  Marland Kitchen OVER THE COUNTER MEDICATION Take 250 mg by mouth daily. Keratin otc supplement  . traZODone (DESYREL) 100 MG tablet TAKE 1 TABLET BY MOUTH AT BEDTIME AS NEEDED FOR SLEEP    PHQ 2/9 Scores 02/07/2020 10/05/2019  PHQ - 2 Score 0 1  PHQ- 9 Score 4 1    GAD 7 : Generalized Anxiety Score 02/07/2020 10/05/2019  Nervous, Anxious, on Edge 0 0  Control/stop worrying 0 0  Worry too much - different things 0 0  Trouble relaxing 0 0  Restless 0 0  Easily annoyed or irritable 0 0  Afraid - awful might happen 0 0  Total GAD 7 Score 0 0  Anxiety Difficulty Not difficult at all Not difficult at all    BP Readings from Last 3 Encounters:  02/07/20 124/78  01/11/20 (!) 119/59  12/21/19 116/61    Physical Exam Vitals and nursing note reviewed.  Constitutional:      General: She is not  in acute distress.    Appearance: She is well-developed.  HENT:     Head: Normocephalic and atraumatic.     Right Ear: Tympanic membrane and ear canal normal.     Left Ear: Tympanic membrane and ear canal normal.     Nose:     Right Sinus: No maxillary sinus tenderness.     Left Sinus: No maxillary sinus tenderness.  Eyes:     General: No scleral icterus.       Right eye: No discharge.        Left eye: No discharge.     Conjunctiva/sclera: Conjunctivae normal.  Neck:     Thyroid: No thyromegaly.     Vascular: No carotid bruit.  Cardiovascular:     Rate and Rhythm: Normal rate and regular rhythm.     Pulses: Normal pulses.     Heart sounds: Normal heart sounds.  Pulmonary:     Effort: Pulmonary effort is  normal. No respiratory distress.     Breath sounds: No wheezing.  Chest:     Breasts:        Right: No mass, nipple discharge, skin change or tenderness.        Left: No mass, nipple discharge, skin change or tenderness.  Abdominal:     General: Bowel sounds are normal.     Palpations: Abdomen is soft.     Tenderness: There is no abdominal tenderness.  Musculoskeletal:     Cervical back: Normal range of motion. No erythema.     Right lower leg: No edema.     Left lower leg: No edema.     Right foot: Normal.     Left foot: Swelling (mild fullnes between the first and second metatarsals) and tenderness (between the first and second metatarasals) present.  Lymphadenopathy:     Cervical: No cervical adenopathy.  Skin:    General: Skin is warm and dry.     Findings: No rash.  Neurological:     Mental Status: She is alert and oriented to person, place, and time.     Cranial Nerves: No cranial nerve deficit.     Sensory: No sensory deficit.     Deep Tendon Reflexes: Reflexes are normal and symmetric.  Psychiatric:        Attention and Perception: Attention normal.        Mood and Affect: Mood normal.     Wt Readings from Last 3 Encounters:  02/07/20 157 lb (71.2 kg)  01/11/20 157 lb 12.8 oz (71.6 kg)  12/21/19 157 lb (71.2 kg)    BP 124/78   Pulse 60   Temp 98 F (36.7 C) (Oral)   Ht 5\' 6"  (1.676 m)   Wt 157 lb (71.2 kg)   SpO2 96%   BMI 25.34 kg/m   Assessment and Plan: 1. Gastroesophageal reflux disease, unspecified whether esophagitis present Symptoms well controlled on daily PPI No red flag signs such as weight loss, n/v, melena Will continue omeprazole. - CBC with Differential/Platelet - Comprehensive metabolic panel  2. Major depressive disorder with single episode, in full remission ([place]) Clinically stable on current regimen with good control of symptoms, No SI or HI. Will continue current therapy. - TSH  3. Primary insomnia Continue Trazodone as  needed  4. Need for immunization against influenza - Flu Vaccine QUAD High Dose(Fluad)  5. Encounter for screening mammogram for breast cancer Pt will sign release from [facility] with [place] today Schedule mammogram in march - MM 3D SCREEN  BREAST BILATERAL; Future  6. Need for shingles vaccine Rx written  7. Routine medical exam Recommend beginning a regular exercise walking program - start slow and gradually increase as able   Partially dictated using Editor, commissioning. Any errors are unintentional.  Halina Maidens, MD [facility]  02/07/2020

## 2020-02-08 LAB — COMPREHENSIVE METABOLIC PANEL
ALT: 20 IU/L (ref 0–32)
AST: 28 IU/L (ref 0–40)
Albumin/Globulin Ratio: 1.9 (ref 1.2–2.2)
Albumin: 4.5 g/dL (ref 3.8–4.8)
Alkaline Phosphatase: 60 IU/L (ref 44–121)
BUN/Creatinine Ratio: 13 (ref 12–28)
BUN: 12 mg/dL (ref 8–27)
Bilirubin Total: 0.4 mg/dL (ref 0.0–1.2)
CO2: 25 mmol/L (ref 20–29)
Calcium: 9.5 mg/dL (ref 8.7–10.3)
Chloride: 104 mmol/L (ref 96–106)
Creatinine, Ser: 0.95 mg/dL (ref 0.57–1.00)
GFR calc Af Amer: 71 mL/min/{1.73_m2} (ref >59)
GFR calc non Af Amer: 61 mL/min/{1.73_m2} (ref >59)
Globulin, Total: 2.4 g/dL (ref 1.5–4.5)
Glucose: 94 mg/dL (ref 65–99)
Potassium: 4.6 mmol/L (ref 3.5–5.2)
Sodium: 141 mmol/L (ref 134–144)
Total Protein: 6.9 g/dL (ref 6.0–8.5)

## 2020-02-08 LAB — CBC WITH DIFFERENTIAL/PLATELET
Basophils Absolute: 0.1 10*3/uL (ref 0.0–0.2)
Basos: 1 %
EOS (ABSOLUTE): 0.1 10*3/uL (ref 0.0–0.4)
Eos: 2 %
Hematocrit: 37.8 % (ref 34.0–46.6)
Hemoglobin: 12.4 g/dL (ref 11.1–15.9)
Immature Grans (Abs): 0 10*3/uL (ref 0.0–0.1)
Immature Granulocytes: 0 %
Lymphocytes Absolute: 1.5 10*3/uL (ref 0.7–3.1)
Lymphs: 37 %
MCH: 30.5 pg (ref 26.6–33.0)
MCHC: 32.8 g/dL (ref 31.5–35.7)
MCV: 93 fL (ref 79–97)
Monocytes Absolute: 0.4 10*3/uL (ref 0.1–0.9)
Monocytes: 11 %
Neutrophils Absolute: 2 10*3/uL (ref 1.4–7.0)
Neutrophils: 49 %
Platelets: 180 10*3/uL (ref 150–450)
RBC: 4.06 x10E6/uL (ref 3.77–5.28)
RDW: 12.5 % (ref 11.7–15.4)
WBC: 4 10*3/uL (ref 3.4–10.8)

## 2020-02-08 LAB — TSH: TSH: 3.44 u[IU]/mL (ref 0.450–4.500)

## 2020-02-14 ENCOUNTER — Telehealth: Payer: Self-pay

## 2020-02-14 NOTE — Telephone Encounter (Signed)
Pt. Given lab results, verbalizes understanding. 

## 2020-02-26 ENCOUNTER — Encounter: Payer: Self-pay | Admitting: Internal Medicine

## 2020-03-07 ENCOUNTER — Other Ambulatory Visit: Payer: Self-pay | Admitting: Internal Medicine

## 2020-03-07 DIAGNOSIS — F325 Major depressive disorder, single episode, in full remission: Secondary | ICD-10-CM

## 2020-03-08 ENCOUNTER — Other Ambulatory Visit: Payer: Self-pay | Admitting: Internal Medicine

## 2020-03-08 ENCOUNTER — Encounter: Payer: Self-pay | Admitting: Internal Medicine

## 2020-03-08 DIAGNOSIS — F325 Major depressive disorder, single episode, in full remission: Secondary | ICD-10-CM

## 2020-03-08 NOTE — Telephone Encounter (Signed)
Patient requesting traZODone (DESYREL) 100 MG tablet, informed patient please allow 48 to 72 hour turn around time, patient state she will run out and would like request expedited.   [place], [place] Phone:  [phone]  Fax:  [phone]

## 2020-03-08 NOTE — Telephone Encounter (Signed)
Requested medication (s) are due for refill today: no  Requested medication (s) are on the active medication list: yes   Last refill:  12/28/2019  Future visit scheduled: yes  Notes to clinic: Patient is requesting medication about 18 days to early direction states once a day Attempted to contact patient to see if she is taking more than one Please review for fill    Requested Prescriptions  Pending Prescriptions Disp Refills   traZODone (DESYREL) 100 MG tablet 90 tablet 0    Sig: Take 1 tablet (100 mg total) by mouth at bedtime as needed. for sleep      Psychiatry: Antidepressants - Serotonin Modulator Passed - 03/08/2020  9:30 AM      Passed - Completed PHQ-2 or PHQ-9 in the last 360 days      Passed - Valid encounter within last 6 months    Recent Outpatient Visits           1 month ago Gastroesophageal reflux disease, unspecified whether esophagitis present   [facility] Glean Hess, MD   5 months ago Sacro-iliac pain   [place], MD       Future Appointments             In 11 months [place] Melia Jesse Sans, MD [facility], [facility]

## 2020-03-09 NOTE — Telephone Encounter (Signed)
Patient called and reported she is taking trazodone 1.5 tabs as directed and has had to increase to 2 tabs at least 3 x a week due to in ability to fall asleep. Please advise if patient can continue to use 2 tabs at night or try any other medication if 2 tabs at night should not be taken.

## 2020-03-09 NOTE — Telephone Encounter (Signed)
Pt response.  KP

## 2020-03-09 NOTE — Telephone Encounter (Signed)
Called pt left VM to call back. Need to know how often pt is taking medication and if pt is taking 1.5 pills of trazodone at a time.  Will route result note to [facility] Nurse Triage for follow up when patient returns call to clinic. Nurse may give results to patient if they return call. CRM created for this message.   KP

## 2020-03-09 NOTE — Telephone Encounter (Signed)
Patient called, left VM to return the call to the office for a message from Dr. Berglund.  

## 2020-03-10 ENCOUNTER — Other Ambulatory Visit: Payer: Self-pay | Admitting: Internal Medicine

## 2020-03-10 DIAGNOSIS — F325 Major depressive disorder, single episode, in full remission: Secondary | ICD-10-CM

## 2020-03-10 MED ORDER — TRAZODONE HCL 100 MG PO TABS: 150.0000 mg | ORAL_TABLET | Freq: Every evening | ORAL | 1 refills | Status: DC | PRN

## 2020-03-10 NOTE — Telephone Encounter (Signed)
Please review.  KP

## 2020-04-12 ENCOUNTER — Telehealth: Payer: Self-pay | Admitting: Internal Medicine

## 2020-04-12 NOTE — Telephone Encounter (Signed)
Pt is having same symptoms as she did back in July with back spasms and burning in lower back. Pt wants to know if Dr Carolin Coy will call in cyclobenzaprine (FLEXERIL) 10 MG tablet  She states it feels exactly the same . Pt has appt Friday, but hopes she will call it in now.  [place], [place]

## 2020-04-12 NOTE — Telephone Encounter (Signed)
Called pt left detailed VM. Can not send in any medications until office visit. Pts name was stated on VM.  KP

## 2020-04-14 ENCOUNTER — Ambulatory Visit (INDEPENDENT_AMBULATORY_CARE_PROVIDER_SITE_OTHER): Payer: Medicare Other | Admitting: Internal Medicine

## 2020-04-14 ENCOUNTER — Encounter: Payer: Self-pay | Admitting: Internal Medicine

## 2020-04-14 ENCOUNTER — Ambulatory Visit
Admission: RE | Admit: 2020-04-14 | Discharge: 2020-04-14 | Disposition: A | Discharge status: Home / Self Care | Payer: Medicare Other | Source: Ambulatory Visit | Attending: Internal Medicine | Admitting: Internal Medicine

## 2020-04-14 ENCOUNTER — Ambulatory Visit
Admission: RE | Admit: 2020-04-14 | Discharge: 2020-04-14 | Disposition: A | Discharge status: Home / Self Care | Payer: Medicare Other | Attending: Internal Medicine | Admitting: Internal Medicine

## 2020-04-14 ENCOUNTER — Other Ambulatory Visit: Payer: Self-pay

## 2020-04-14 VITALS — BP 128/64 | HR 66 | Temp 97.7°F | Ht 66.0 in | Wt 158.0 lb

## 2020-04-14 DIAGNOSIS — R002 Palpitations: Secondary | ICD-10-CM | POA: Diagnosis not present

## 2020-04-14 DIAGNOSIS — M533 Sacrococcygeal disorders, not elsewhere classified: Secondary | ICD-10-CM | POA: Diagnosis not present

## 2020-04-14 DIAGNOSIS — M25551 Pain in right hip: Secondary | ICD-10-CM | POA: Insufficient documentation

## 2020-04-14 DIAGNOSIS — R0602 Shortness of breath: Secondary | ICD-10-CM | POA: Diagnosis present

## 2020-04-14 DIAGNOSIS — R0600 Dyspnea, unspecified: Secondary | ICD-10-CM | POA: Insufficient documentation

## 2020-04-14 NOTE — Progress Notes (Signed)
Date:  04/14/2020   Name:  Claudia Gutierrez   DOB:  June 19, 1950   MRN:  [id number]   Chief Complaint: Back Pain (X1 week, Lower left side, pain comes and goes) and Palpitations (Comes and goes, SOB, Wheezing )  Palpitations  This is a recurrent problem. The problem occurs 2 to 4 times per day. The problem has been unchanged. On average, each episode lasts 1 minute. The symptoms are aggravated by stress. Associated symptoms include dizziness, an irregular heartbeat and shortness of breath. Pertinent negatives include no anxiety, chest fullness, chest pain, coughing, diaphoresis, near-syncope, numbness, vomiting or weakness.  Shortness of Breath This is a chronic problem. The problem occurs daily. The problem has been unchanged. Pertinent negatives include no chest pain, headaches, leg swelling, orthopnea, sore throat, sputum production or vomiting. The symptoms are aggravated by any activity. She has tried nothing for the symptoms. Her past medical history is significant for allergies. There is no history of asthma, chronic lung disease, COPD or pneumonia.  Back Pain This is a recurrent problem. The pain is present in the lumbar spine. The pain does not radiate. The pain is mild. Pertinent negatives include no chest pain, headaches, numbness or weakness. Risk factors: caring for her dependent 55 year old mother.    Lab Results  Component Value Date   CREATININE 0.95 02/07/2020   BUN 12 02/07/2020   NA 141 02/07/2020   K 4.6 02/07/2020   CL 104 02/07/2020   CO2 25 02/07/2020   No results found for: CHOL, HDL, LDLCALC, LDLDIRECT, TRIG, CHOLHDL Lab Results  Component Value Date   TSH 3.440 02/07/2020   No results found for: HGBA1C Lab Results  Component Value Date   WBC 4.0 02/07/2020   HGB 12.4 02/07/2020   HCT 37.8 02/07/2020   MCV 93 02/07/2020   PLT 180 02/07/2020   Lab Results  Component Value Date   ALT 20 02/07/2020   AST 28 02/07/2020   ALKPHOS 60 02/07/2020   BILITOT  0.4 02/07/2020     Review of Systems  Constitutional: Negative for diaphoresis.  HENT: Negative for sore throat.   Respiratory: Positive for shortness of breath. Negative for cough and sputum production.   Cardiovascular: Positive for palpitations. Negative for chest pain, orthopnea, leg swelling and near-syncope.  Gastrointestinal: Negative for vomiting.  Genitourinary: Negative for difficulty urinating.  Musculoskeletal: Positive for back pain.  Allergic/Immunologic: Negative for environmental allergies.  Neurological: Positive for dizziness. Negative for syncope, weakness, numbness and headaches.  Psychiatric/Behavioral: Positive for sleep disturbance (better with trazodone). Negative for dysphoric mood. The patient is not nervous/anxious.     Patient Active Problem List   Diagnosis Date Noted  . History of colon polyps 10/05/2019  . Pancreatic cyst 10/05/2019  . Calculus of lower urinary tract 01/12/2014  . GERD (gastroesophageal reflux disease) 10/12/2013  . Major depressive disorder, single episode, unspecified 10/12/2013    Allergies  Allergen Reactions  . Azithromycin Other (See Comments)    SWELLING/EDEMA  . Paroxetine Hcl Nausea Only and Other (See Comments)    GI Upset  . Ciprofloxacin Itching    Phlebitis   . Codeine Nausea And Vomiting    Past Surgical History:  Procedure Laterality Date  . ACHILLES TENDON REPAIR  2008  . BIOPSY  10/21/2019   Procedure: BIOPSY;  Surgeon: Milus Banister, MD;  Location: [facility];  Service: Endoscopy;;  . BREAST CYST ASPIRATION Right 2010  . CATARACT EXTRACTION W/PHACO Right 12/21/2019  Procedure: CATARACT EXTRACTION PHACO AND INTRAOCULAR LENS PLACEMENT (IOC) RIGHT 4.15 00:31.2;  Surgeon: Birder Robson, MD;  Location: [facility];  Service: Ophthalmology;  Laterality: Right;  . CATARACT EXTRACTION W/PHACO Left 01/11/2020   Procedure: CATARACT EXTRACTION PHACO AND INTRAOCULAR LENS PLACEMENT (IOC) LEFT 4.78  00:26.5;  Surgeon: Birder Robson, MD;  Location: [facility];  Service: Ophthalmology;  Laterality: Left;  . COLONOSCOPY WITH PROPOFOL N/A 09/13/2019   Procedure: COLONOSCOPY WITH PROPOFOL;  Surgeon: Jonathon Bellows, MD;  Location: [facility] END[facility]COPY;  Service: Gastroenterology;  Laterality: N/A;  . ESOPHAGOGASTRODUODENOSCOPY (EGD) WITH PROPOFOL N/A 10/21/2019   Procedure: ESOPHAGOGASTRODUODENOSCOPY (EGD) WITH PROPOFOL;  Surgeon: Milus Banister, MD;  Location: [facility];  Service: Endoscopy;  Laterality: N/A;  . EUS N/A 10/21/2019   Procedure: UPPER ENDOSCOPIC ULTRASOUND (EUS) RADIAL;  Surgeon: Milus Banister, MD;  Location: [facility];  Service: Endoscopy;  Laterality: N/A;  . FINE NEEDLE ASPIRATION N/A 10/21/2019   Procedure: FINE NEEDLE ASPIRATION (FNA) LINEAR;  Surgeon: Milus Banister, MD;  [facility];  Service: Endoscopy;  Laterality: N/A;    Social History   Tobacco Use  . Smoking status: Former Smoker    Years: 10.00    Quit date: 2002    Years since quitting: 20.0  . Smokeless tobacco: Never Used  Vaping Use  . Vaping Use: Never used  Substance Use Topics  . Alcohol use: Yes    Comment: socially  . Drug use: Never     Medication list has been reviewed and updated.  Current Meds  Medication Sig  . citalopram (CELEXA) 20 MG tablet Take 1 tablet (20 mg total) by mouth daily.  . folic acid (FOLVITE) 1 MG tablet Take 1 mg by mouth daily.  . Multiple Vitamins-Minerals (HAIR SKIN AND NAILS FORMULA) TABS Take 1 tablet by mouth daily.  . Omega-3 Fatty Acids (FISH OIL) 1000 MG CAPS Take 1,000 mg by mouth daily.  Marland Kitchen omeprazole (PRILOSEC) 20 MG capsule Take 1 capsule (20 mg total) by mouth daily.  Marland Kitchen OVER THE COUNTER MEDICATION Take 250 mg by mouth daily. Keratin otc supplement  . traZODone (DESYREL) 100 MG tablet Take 1.5-2 tablets (150-200 mg total) by mouth at bedtime as needed. for sleep    PHQ 2/9 Scores 04/14/2020 02/07/2020 10/05/2019  PHQ - 2 Score 0 0 1   PHQ- 9 Score 3 4 1     GAD 7 : Generalized Anxiety Score 04/14/2020 02/07/2020 10/05/2019  Nervous, Anxious, on Edge 0 0 0  Control/stop worrying 1 0 0  Worry too much - different things 1 0 0  Trouble relaxing 0 0 0  Restless 0 0 0  Easily annoyed or irritable 0 0 0  Afraid - awful might happen 0 0 0  Total GAD 7 Score 2 0 0  Anxiety Difficulty - Not difficult at all Not difficult at all    BP Readings from Last 3 Encounters:  04/14/20 128/64  02/07/20 124/78  01/11/20 (!) 119/59    Physical Exam Vitals and nursing note reviewed.  Constitutional:      General: She is not in acute distress.    Appearance: Normal appearance. She is well-developed.  HENT:     Head: Normocephalic and atraumatic.  Neck:     Vascular: No carotid bruit.  Cardiovascular:     Rate and Rhythm: Normal rate and regular rhythm.     Pulses: Normal pulses.     Heart sounds: No murmur heard.   Pulmonary:  Effort: Pulmonary effort is normal. No respiratory distress.     Breath sounds: No wheezing or rhonchi.  Musculoskeletal:     Cervical back: Normal range of motion.     Lumbar back: Tenderness (over left SI region) present. Negative right straight leg raise test and negative left straight leg raise test.     Right lower leg: No edema.     Left lower leg: No edema.  Lymphadenopathy:     Cervical: No cervical adenopathy.  Skin:    General: Skin is warm and dry.     Findings: No rash.  Neurological:     Mental Status: She is alert and oriented to person, place, and time.  Psychiatric:        Mood and Affect: Mood and affect normal.        Behavior: Behavior normal.        Thought Content: Thought content normal.     Wt Readings from Last 3 Encounters:  04/14/20 158 lb (71.7 kg)  02/07/20 157 lb (71.2 kg)  01/11/20 157 lb 12.8 oz (71.6 kg)    BP 128/64   Pulse 66   Temp 97.7 F (36.5 C) (Oral)   Ht 5\' 6"  (1.676 m)   Wt 158 lb (71.7 kg)   SpO2 98%   BMI 25.50 kg/m   Assessment  and Plan: 1. Palpitation Intermittent but occurring most days without associated severe symptoms Pt will return next week for Zio monitor placement - EKG 12-Lead - SR @ 63; WNL  2. Shortness of breath Without obvious cause - will get CXR and refer to Pulmonary - Ambulatory referral to Pulmonology - DG Chest 2 View  3. Sacro-iliac pain Likely strain from care-giving duties Recommend Aleve and heat as needed   Partially dictated using Editor, commissioning. Any errors are unintentional.  Halina Maidens, MD [facility]  04/14/2020

## 2020-04-15 ENCOUNTER — Encounter: Payer: Self-pay | Admitting: Internal Medicine

## 2020-04-16 ENCOUNTER — Encounter: Payer: Self-pay | Admitting: Internal Medicine

## 2020-04-16 DIAGNOSIS — I700 Atherosclerosis of aorta: Secondary | ICD-10-CM | POA: Insufficient documentation

## 2020-04-18 ENCOUNTER — Telehealth: Payer: Self-pay

## 2020-04-18 NOTE — Telephone Encounter (Signed)
Will do labs tomorrow at visit.  KP

## 2020-04-18 NOTE — Telephone Encounter (Signed)
Copied from [place] [phone]. Topic: General - Other >> Apr 18, 2020 12:02 PM Celene Kras wrote: Reason for CRM: Pt called stating that she received a call from office stating she needed to have her cholesterol rechecked. Pt does not have lab orders placed and is requesting to have those done tomorrow with her already scheduled appt. Please advise.

## 2020-04-19 ENCOUNTER — Ambulatory Visit: Payer: Medicare Other

## 2020-05-03 ENCOUNTER — Ambulatory Visit (INDEPENDENT_AMBULATORY_CARE_PROVIDER_SITE_OTHER): Payer: Medicare HMO | Admitting: Internal Medicine

## 2020-05-03 ENCOUNTER — Other Ambulatory Visit: Payer: Self-pay

## 2020-05-03 ENCOUNTER — Encounter: Payer: Self-pay | Admitting: Internal Medicine

## 2020-05-03 VITALS — BP 126/78 | HR 83 | Temp 97.9°F | Ht 66.0 in | Wt 157.0 lb

## 2020-05-03 DIAGNOSIS — J0100 Acute maxillary sinusitis, unspecified: Secondary | ICD-10-CM | POA: Diagnosis not present

## 2020-05-03 DIAGNOSIS — Z20822 Contact with and (suspected) exposure to covid-19: Secondary | ICD-10-CM | POA: Diagnosis not present

## 2020-05-03 MED ORDER — AMOXICILLIN-POT CLAVULANATE 875-125 MG PO TABS: 1.0000 | ORAL_TABLET | Freq: Two times a day (BID) | ORAL | 0 refills | Status: DC

## 2020-05-03 MED ORDER — PROMETHAZINE-DM 6.25-15 MG/5ML PO SYRP: 5.0000 mL | ORAL_SOLUTION | Freq: Four times a day (QID) | ORAL | 0 refills | Status: DC | PRN

## 2020-05-03 NOTE — Progress Notes (Signed)
Date:  05/03/2020   Name:  Claudia Gutierrez   DOB:  08-17-1950   MRN:  [id number]   Chief Complaint: Cough (Shortness of breathe, sore throat, chest tightness, unsure of temp. )  Cough This is a new problem. The current episode started in the past 7 days. The problem has been unchanged. The problem occurs every few minutes. The cough is non-productive. Associated symptoms include postnasal drip, a sore throat, shortness of breath and wheezing. Pertinent negatives include no chest pain, chills, fever or headaches. She has tried nothing for the symptoms.  Boyfriend is also sick - tested negative for Covid 7 days ago in [facility].  Lab Results  Component Value Date   CREATININE 0.95 02/07/2020   BUN 12 02/07/2020   NA 141 02/07/2020   K 4.6 02/07/2020   CL 104 02/07/2020   CO2 25 02/07/2020   No results found for: CHOL, HDL, LDLCALC, LDLDIRECT, TRIG, CHOLHDL Lab Results  Component Value Date   TSH 3.440 02/07/2020   No results found for: HGBA1C Lab Results  Component Value Date   WBC 4.0 02/07/2020   HGB 12.4 02/07/2020   HCT 37.8 02/07/2020   MCV 93 02/07/2020   PLT 180 02/07/2020   Lab Results  Component Value Date   ALT 20 02/07/2020   AST 28 02/07/2020   ALKPHOS 60 02/07/2020   BILITOT 0.4 02/07/2020     Review of Systems  Constitutional: Negative for chills and fever.  HENT: Positive for congestion, postnasal drip, sinus pressure and sore throat.   Eyes: Negative for visual disturbance.  Respiratory: Positive for cough, shortness of breath and wheezing.   Cardiovascular: Negative for chest pain, palpitations and leg swelling.  Neurological: Negative for dizziness and headaches.    Patient Active Problem List   Diagnosis Date Noted  . Aortic atherosclerosis ([place]) 04/16/2020  . Sacro-iliac pain 04/14/2020  . Shortness of breath 04/14/2020  . Palpitation 04/14/2020  . History of colon polyps 10/05/2019  . Pancreatic cyst 10/05/2019  . GERD (gastroesophageal reflux  disease) 10/12/2013  . Major depressive disorder, single episode, unspecified 10/12/2013    Allergies  Allergen Reactions  . Azithromycin Other (See Comments)    SWELLING/EDEMA  . Paroxetine Hcl Nausea Only and Other (See Comments)    GI Upset  . Ciprofloxacin Itching    Phlebitis   . Codeine Nausea And Vomiting    Past Surgical History:  Procedure Laterality Date  . ACHILLES TENDON REPAIR  2008  . BIOPSY  10/21/2019   Procedure: BIOPSY;  Surgeon: Milus Banister, MD;  Location: [facility];  Service: Endoscopy;;  . BREAST CYST ASPIRATION Right 2010  . CATARACT EXTRACTION W/PHACO Right 12/21/2019   Procedure: CATARACT EXTRACTION PHACO AND INTRAOCULAR LENS PLACEMENT (IOC) RIGHT 4.15 00:31.2;  Surgeon: Birder Robson, MD;  Location: [facility];  Service: Ophthalmology;  Laterality: Right;  . CATARACT EXTRACTION W/PHACO Left 01/11/2020   Procedure: CATARACT EXTRACTION PHACO AND INTRAOCULAR LENS PLACEMENT (IOC) LEFT 4.78 00:26.5;  Surgeon: Birder Robson, MD;  Location: [facility];  Service: Ophthalmology;  Laterality: Left;  . COLONOSCOPY WITH PROPOFOL N/A 09/13/2019   Procedure: COLONOSCOPY WITH PROPOFOL;  Surgeon: Jonathon Bellows, MD;  Location[facility];  Service: Gastroenterology;  Laterality: N/A;  . ESOPHAGOGASTRODUODENOSCOPY (EGD) WITH PROPOFOL N/A 10/21/2019   Procedure: ESOPHAGOGASTRODUODENOSCOPY (EGD) WITH PROPOFOL;  Surgeon: Milus Banister, MD;  Location: [facility];  Service: Endoscopy;  Laterality: N/A;  . EUS N/A 10/21/2019   Procedure: UPPER ENDOSCOPIC ULTRASOUND (  EUS) RADIAL;  Surgeon: Milus Banister, MD;  Location: [facility];  Service: Endoscopy;  Laterality: N/A;  . FINE NEEDLE ASPIRATION N/A 10/21/2019   Procedure: FINE NEEDLE ASPIRATION (FNA) LINEAR;  Surgeon: Milus Banister, MD;  Location: [facility];  Service: Endoscopy;  Laterality: N/A;    Social History   Tobacco Use  . Smoking status: Former Smoker    Years: 10.00     Quit date: 2002    Years since quitting: 20.1  . Smokeless tobacco: Never Used  Vaping Use  . Vaping Use: Never used  Substance Use Topics  . Alcohol use: Yes    Comment: socially  . Drug use: Never     Medication list has been reviewed and updated.  Current Meds  Medication Sig  . citalopram (CELEXA) 20 MG tablet Take 1 tablet (20 mg total) by mouth daily.  . folic acid (FOLVITE) 1 MG tablet Take 1 mg by mouth daily.  . Multiple Vitamins-Minerals (HAIR SKIN AND NAILS FORMULA) TABS Take 1 tablet by mouth daily.  . Omega-3 Fatty Acids (FISH OIL) 1000 MG CAPS Take 1,000 mg by mouth daily.  Marland Kitchen omeprazole (PRILOSEC) 20 MG capsule Take 1 capsule (20 mg total) by mouth daily.  Marland Kitchen OVER THE COUNTER MEDICATION Take 250 mg by mouth daily. Keratin otc supplement  . traZODone (DESYREL) 100 MG tablet Take 1.5-2 tablets (150-200 mg total) by mouth at bedtime as needed. for sleep    PHQ 2/9 Scores 05/03/2020 04/14/2020 02/07/2020 10/05/2019  PHQ - 2 Score 4 0 0 1  PHQ- 9 Score 11 3 4 1     GAD 7 : Generalized Anxiety Score 05/03/2020 04/14/2020 02/07/2020 10/05/2019  Nervous, Anxious, on Edge 0 0 0 0  Control/stop worrying 0 1 0 0  Worry too much - different things 0 1 0 0  Trouble relaxing 0 0 0 0  Restless 0 0 0 0  Easily annoyed or irritable 0 0 0 0  Afraid - awful might happen 0 0 0 0  Total GAD 7 Score 0 2 0 0  Anxiety Difficulty Not difficult at all - Not difficult at all Not difficult at all    BP Readings from Last 3 Encounters:  05/03/20 126/78  04/14/20 128/64  02/07/20 124/78    Physical Exam Vitals and nursing note reviewed.  Constitutional:      General: She is not in acute distress.    Appearance: She is well-developed. She is ill-appearing.  HENT:     Head: Normocephalic and atraumatic.     Right Ear: Tympanic membrane, ear canal and external ear normal.     Left Ear: Tympanic membrane, ear canal and external ear normal.     Nose:     Right Sinus: Maxillary sinus tenderness  present. No frontal sinus tenderness.     Left Sinus: Maxillary sinus tenderness present. No frontal sinus tenderness.     Mouth/Throat:     Pharynx: No posterior oropharyngeal erythema.  Cardiovascular:     Rate and Rhythm: Normal rate and regular rhythm.     Pulses: Normal pulses.     Heart sounds: Normal heart sounds.  Pulmonary:     Effort: Pulmonary effort is normal. No respiratory distress.     Breath sounds: Normal breath sounds. No wheezing or rhonchi.  Musculoskeletal:     Cervical back: Normal range of motion.     Right lower leg: No edema.     Left lower leg: No edema.  Lymphadenopathy:  Cervical: No cervical adenopathy.  Skin:    General: Skin is warm and dry.     Findings: No rash.  Neurological:     Mental Status: She is alert and oriented to person, place, and time.  Psychiatric:        Mood and Affect: Mood and affect normal.        Behavior: Behavior normal.        Thought Content: Thought content normal.     Wt Readings from Last 3 Encounters:  05/03/20 157 lb (71.2 kg)  04/14/20 158 lb (71.7 kg)  02/07/20 157 lb (71.2 kg)    BP 126/78   Pulse 83   Temp 97.9 F (36.6 C) (Oral)   Ht 5\' 6"  (1.676 m)   Wt 157 lb (71.2 kg)   SpO2 96% Comment: dropped to 91%-93% when coughing  BMI 25.34 kg/m   Assessment and Plan: 1. Suspected COVID-19 virus infection Quarantine until test returns Tylenol, fluids - Novel Coronavirus, NAA ([facility]) - promethazine-dextromethorphan (PROMETHAZINE-DM) 6.25-15 MG/5ML syrup; Take 5 mLs by mouth 4 (four) times daily as needed for cough.  Dispense: 118 mL; Refill: 0  2. Acute non-recurrent maxillary sinusitis - amoxicillin-clavulanate (AUGMENTIN) 875-125 MG tablet; Take 1 tablet by mouth 2 (two) times daily for 10 days.  Dispense: 20 tablet; Refill: 0   Partially dictated using Editor, commissioning. Any errors are unintentional.  Halina Maidens, MD [facility]  05/03/2020

## 2020-05-04 ENCOUNTER — Encounter: Payer: Self-pay | Admitting: Internal Medicine

## 2020-05-05 LAB — NOVEL CORONAVIRUS, NAA: SARS-CoV-2, NAA: NOT DETECTED

## 2020-05-05 LAB — SARS-COV-2, NAA 2 DAY TAT

## 2020-05-05 LAB — SPECIMEN STATUS REPORT

## 2020-05-06 ENCOUNTER — Encounter: Payer: Self-pay | Admitting: Internal Medicine

## 2020-05-06 ENCOUNTER — Other Ambulatory Visit: Payer: Self-pay | Admitting: Internal Medicine

## 2020-05-06 DIAGNOSIS — J0100 Acute maxillary sinusitis, unspecified: Secondary | ICD-10-CM

## 2020-05-06 MED ORDER — CEFDINIR 300 MG PO CAPS: 300.0000 mg | ORAL_CAPSULE | Freq: Two times a day (BID) | ORAL | 0 refills | Status: AC

## 2020-05-07 ENCOUNTER — Encounter: Payer: Self-pay | Admitting: Internal Medicine

## 2020-05-11 ENCOUNTER — Encounter: Payer: Self-pay | Admitting: Internal Medicine

## 2020-05-11 ENCOUNTER — Other Ambulatory Visit: Payer: Self-pay

## 2020-05-11 ENCOUNTER — Other Ambulatory Visit: Payer: Self-pay | Admitting: Internal Medicine

## 2020-05-11 DIAGNOSIS — R197 Diarrhea, unspecified: Secondary | ICD-10-CM

## 2020-05-11 MED ORDER — FLUCONAZOLE 150 MG PO TABS: 150.0000 mg | ORAL_TABLET | Freq: Once | ORAL | 0 refills | Status: AC

## 2020-05-11 NOTE — Telephone Encounter (Signed)
Please review.  KP

## 2020-05-12 ENCOUNTER — Other Ambulatory Visit: Payer: Self-pay | Admitting: Internal Medicine

## 2020-05-12 ENCOUNTER — Telehealth: Payer: Self-pay | Admitting: Internal Medicine

## 2020-05-12 DIAGNOSIS — B3731 Acute candidiasis of vulva and vagina: Secondary | ICD-10-CM

## 2020-05-12 DIAGNOSIS — R197 Diarrhea, unspecified: Secondary | ICD-10-CM | POA: Diagnosis not present

## 2020-05-12 DIAGNOSIS — B373 Candidiasis of vulva and vagina: Secondary | ICD-10-CM

## 2020-05-12 MED ORDER — FLUCONAZOLE 100 MG PO TABS: 100.0000 mg | ORAL_TABLET | Freq: Once | ORAL | 0 refills | Status: AC

## 2020-05-12 NOTE — Telephone Encounter (Signed)
PT is calling is calling to report that she did drop off the stool sample to [facility]. And that she is very uncomfortable from the Hemorids (on the inside) they are burring bad. And pt reports that she is still having diarrhea for 4 days. Pt states that Dr. Army Melia is aware that PT has diarrhea. Please advise CB- [phone]

## 2020-05-12 NOTE — Telephone Encounter (Signed)
Called pt left VM that she can try OTC preparation H or rectal care for hemorrhoids a swell as vaseline or diaper rash ointment for burning. Pts name was stated on VM.  KP

## 2020-05-13 LAB — CLOSTRIDIUM DIFFICILE BY PCR: Toxigenic C. Difficile by PCR: NEGATIVE

## 2020-05-17 ENCOUNTER — Encounter: Payer: Self-pay | Admitting: Internal Medicine

## 2020-05-17 ENCOUNTER — Other Ambulatory Visit: Payer: Self-pay | Admitting: Internal Medicine

## 2020-05-17 DIAGNOSIS — K649 Unspecified hemorrhoids: Secondary | ICD-10-CM

## 2020-05-22 DIAGNOSIS — H02831 Dermatochalasis of right upper eyelid: Secondary | ICD-10-CM | POA: Diagnosis not present

## 2020-05-22 DIAGNOSIS — Z961 Presence of intraocular lens: Secondary | ICD-10-CM | POA: Diagnosis not present

## 2020-05-23 ENCOUNTER — Encounter: Payer: Self-pay | Admitting: General Surgery

## 2020-05-23 ENCOUNTER — Ambulatory Visit (INDEPENDENT_AMBULATORY_CARE_PROVIDER_SITE_OTHER): Payer: MEDICARE | Admitting: General Surgery

## 2020-05-23 ENCOUNTER — Other Ambulatory Visit: Payer: Self-pay

## 2020-05-23 VITALS — BP 130/80 | HR 88 | Temp 98.1°F | Resp 14 | Ht 66.0 in | Wt 158.0 lb

## 2020-05-23 DIAGNOSIS — K602 Anal fissure, unspecified: Secondary | ICD-10-CM | POA: Diagnosis not present

## 2020-05-23 NOTE — Patient Instructions (Signed)
Your prescription for Nifedipine ointment has been called into [facility] in [place]. This is a compounded medication and they are the only pharmacy that does this. Insurance does not normally cover this medication and the cost is $45. Apply a small amount to your finger and place this just inside the rectum and around the anal opening three times a day for 6 weeks.  The drug store will call you to let you know when this prescription is ready. [facility] is located at [address] [place], [place] [address] [facility]: [phone]  Try using moist flush able wipes after bowel movements as well as sitz baths.   Follow up here in 6 weeks  Anal Fissure, Adult  An anal fissure is a small tear or crack in the tissue around the opening of the butt (anus). Bleeding from the tear or crack usually stops on its own within a few minutes. The bleeding may happen every time you poop (have a bowel movement) until the tear or crack heals. What are the causes? This condition is usually caused by passing a large or hard poop (stool). Other causes include:  Trouble pooping (constipation).  Passing watery poop (diarrhea).  Inflammatory bowel disease (Crohn's disease or ulcerative colitis).  Childbirth.  Infections.  Anal sex. What are the signs or symptoms? Symptoms of this condition include:  Bleeding from the butt.  Small amounts of blood on your poop. The blood coats the outside of the poop. It is not mixed with the poop.  Small amounts of blood on the toilet paper or in the toilet after you poop.  Pain when passing poop.  Itching or irritation around the opening of the butt. How is this diagnosed? This condition may be diagnosed based on a physical exam. Your doctor may:  Check your butt. A tear can often be seen by checking the area with care.  Check your butt using a short tube (anoscope). The light in the tube will show any problems in your butt. How is this treated? Treatment for  this condition may include:  Treating problems that make it hard for you to pass poop. You may be told to: ? Eat more fiber. ? Drink more fluid. ? Take fiber supplements. ? Take medicines that make poop soft.  Taking sitz baths. This may help to heal the tear.  Using creams and ointments. If your condition gets worse, other treatments may be needed such as:  A shot near the tear or crack (botulinum injection).  Surgery to repair the tear or crack. Follow these instructions at home: Eating and drinking  Avoid bananas and dairy products. These foods can make it hard to poop.  Drink enough fluid to keep your pee (urine) pale yellow.  Eat foods that have a lot of fiber in them, such as: ? Beans. ? Whole grains. ? Fresh fruits. ? Fresh vegetables.   General instructions  Take over-the-counter and prescription medicines only as told by your doctor.  Use creams or ointments only as told by your doctor.  Keep the butt area as clean and dry as you can.  Take a warm water bath (sitz bath) as told by your doctor. Do not use soap.  Keep all follow-up visits as told by your doctor. This is important.   Contact a doctor if:  You have more bleeding.  You have a fever.  You have watery poop that is mixed with blood.  You have pain.  Your problem gets worse, not better.  Summary  An anal fissure is a small tear or crack in the skin around the opening of the butt (anus).  This condition is usually caused by passing a large or hard poop (stool).  Treatment includes treating the problems that make it hard for you to pass poop.  Follow your doctor's instructions about caring for your condition at home.  Keep all follow-up visits as told by your doctor. This is important. This information is not intended to replace advice given to you by your health care provider. Make sure you discuss any questions you have with your health care provider. Document Revised: 08/28/2017 Document  Reviewed: 08/28/2017 Elsevier Patient Education  [place].

## 2020-05-23 NOTE — Progress Notes (Signed)
Patient ID: Claudia Gutierrez, female   DOB: Aug 17, 1950, 70 y.o.   MRN: [id number]  No chief complaint on file.   HPI Claudia Gutierrez is a 70 y.o. female.   She has been referred by her primary care provider, Dr. Doyle Askew, for surgical evaluation of hemorrhoidal disease.  [name] states that she has had hemorrhoids for many years, but mostly they resolve on their own.  Approximately a month ago, she developed significant diarrhea secondary to antibiotic use.  Since then, she says that she feels very swollen inside.  She has noticed a little bit of bright red blood on her toilet paper after bowel movements.  She states that she has burning and itching and pain in the area.  She has not been performing sitz baths, but did try Preparation H.  She reports that it burned significantly on contact, so she did not use it further.  She also applied Desitin with little relief.  She had a recent colonoscopy, in June 2021.  Notably, no hemorrhoids were appreciated either externally or internally.  I personally reviewed the images taken of the retroflexion views and agree that no hemorrhoids are seen.   Past Medical History:  Diagnosis Date  . Acid reflux   . Anxiety   . Wears dentures    Full upper    Past Surgical History:  Procedure Laterality Date  . ACHILLES TENDON REPAIR  2008  . BIOPSY  10/21/2019   Procedure: BIOPSY;  Surgeon: Milus Banister, MD;  Location: [facility];  Service: Endoscopy;;  . BREAST CYST ASPIRATION Right 2010  . CATARACT EXTRACTION W/PHACO Right 12/21/2019   Procedure: CATARACT EXTRACTION PHACO AND INTRAOCULAR LENS PLACEMENT (IOC) RIGHT 4.15 00:31.2;  Surgeon: Birder Robson, MD;  Location: [facility];  Service: Ophthalmology;  Laterality: Right;  . CATARACT EXTRACTION W/PHACO Left 01/11/2020   Procedure: CATARACT EXTRACTION PHACO AND INTRAOCULAR LENS PLACEMENT (IOC) LEFT 4.78 00:26.5;  Surgeon: Birder Robson, MD;  Location: [facility];  Service:  Ophthalmology;  Laterality: Left;  . COLONOSCOPY WITH PROPOFOL N/A 09/13/2019   Procedure: COLONOSCOPY WITH PROPOFOL;  Surgeon: Jonathon Bellows, MD;  Location: [facility] END[facility]COPY;  Service: Gastroenterology;  Laterality: N/A;  . ESOPHAGOGASTRODUODENOSCOPY (EGD) WITH PROPOFOL N/A 10/21/2019   Procedure: ESOPHAGOGASTRODUODENOSCOPY (EGD) WITH PROPOFOL;  Surgeon: Milus Banister, MD;  Location: [facility];  Service: Endoscopy;  Laterality: N/A;  . EUS N/A 10/21/2019   Procedure: UPPER ENDOSCOPIC ULTRASOUND (EUS) RADIAL;  Surgeon: Milus Banister, MD;  Location: [facility];  Service: Endoscopy;  Laterality: N/A;  . FINE NEEDLE ASPIRATION N/A 10/21/2019   Procedure: FINE NEEDLE ASPIRATION (FNA) LINEAR;  Surgeon: Milus Banister, MD;  Location: [facility];  Service: Endoscopy;  Laterality: N/A;    Family History  Problem Relation Age of Onset  . Breast cancer Sister 30       and stomach ca  . Diabetes Sister   . Colon cancer Sister 51  . Lung cancer Father   . Diabetes Father   . Heart disease Father   . Hypertension Father   . Stroke Paternal Grandmother     Social History Social History   Tobacco Use  . Smoking status: Former Smoker    Years: 10.00    Quit date: 2002    Years since quitting: 20.1  . Smokeless tobacco: Never Used  Vaping Use  . Vaping Use: Never used  Substance Use Topics  . Alcohol use: Yes    Comment: socially  . Drug  use: Never    Allergies  Allergen Reactions  . Azithromycin Other (See Comments)    SWELLING/EDEMA  . Paroxetine Hcl Nausea Only and Other (See Comments)    GI Upset  . Ciprofloxacin Itching    Phlebitis   . Sodium Clavulanate Diarrhea  . Codeine Nausea And Vomiting    Current Outpatient Medications  Medication Sig Dispense Refill  . citalopram (CELEXA) 20 MG tablet Take 1 tablet (20 mg total) by mouth daily. 90 tablet 1  . folic acid (FOLVITE) 1 MG tablet Take 1 mg by mouth daily.    . Multiple Vitamins-Minerals (HAIR SKIN AND NAILS  FORMULA) TABS Take 1 tablet by mouth daily.    . Omega-3 Fatty Acids (FISH OIL) 1000 MG CAPS Take 1,000 mg by mouth daily.    Marland Kitchen omeprazole (PRILOSEC) 20 MG capsule Take 1 capsule (20 mg total) by mouth daily. 90 capsule 0  . OVER THE COUNTER MEDICATION Take 250 mg by mouth daily. Keratin otc supplement    . traZODone (DESYREL) 100 MG tablet Take 1.5-2 tablets (150-200 mg total) by mouth at bedtime as needed. for sleep 180 tablet 1   No current facility-administered medications for this visit.    Review of Systems Review of Systems  Respiratory: Positive for wheezing.   Gastrointestinal: Positive for abdominal pain and diarrhea.       Perianal itching  All other systems reviewed and are negative.   Blood pressure 130/80, pulse 88, temperature 98.1 F (36.7 C), resp. rate 14, height 5\' 6"  (1.676 m), weight 158 lb (71.7 kg), SpO2 97 %. Body mass index is 25.5 kg/m.  Physical Exam Physical Exam Vitals reviewed. Exam conducted with a chaperone present.  Constitutional:      General: She is not in acute distress.    Appearance: She is normal weight.  HENT:     Head: Normocephalic and atraumatic.     Nose:     Comments: Covered with a mask    Mouth/Throat:     Comments: Covered with a mask Eyes:     General: No scleral icterus.       Right eye: No discharge.        Left eye: No discharge.  Neck:     Comments: There is no palpable cervical or supraclavicular lymphadenopathy.  The trachea is midline.  The left side of the thyroid is mildly enlarged, but no discrete mass is palpable.  The gland moves freely with deglutition. Cardiovascular:     Rate and Rhythm: Normal rate and regular rhythm.  Pulmonary:     Effort: Pulmonary effort is normal.     Breath sounds: Normal breath sounds.  Abdominal:     General: Bowel sounds are normal.     Palpations: Abdomen is soft.  Genitourinary:      Comments: No hemorrhoids are present.  She has a small skin tag accompanied by what appears  to be an anal fissure. Musculoskeletal:     Right lower leg: No edema.     Left lower leg: No edema.     Comments: Scar on her right posterior ankle secondary to Achilles tendon repair.  Skin:    General: Skin is warm and dry.  Neurological:     General: No focal deficit present.     Mental Status: She is alert and oriented to person, place, and time.  Psychiatric:        Mood and Affect: Mood normal.        Behavior:  Behavior normal.     Data Reviewed I reviewed a number of telephone notes between Dr. Gaspar Cola office and the patient regarding her diarrhea and the symptoms that she has been experiencing.    I also personally reviewed the imaging from her colonoscopy in June, including the retroflexed views that do not demonstrate any internal hemorrhoids.  The external and digital examinations were reported as normal by the gastroenterology provider.  Assessment This is a 71 year old woman who has had a 1 month history of perianal pain.  She had ascribed this to hemorrhoids, but on physical examination, no hemorrhoids are present and she does appear to have a small posterior anal fissure.  Plan We have prescribed a compounded ointment of nifedipine and lidocaine.  This is applied 3 times daily for 6 weeks.  We have scheduled her for a follow-up at that time.  If the fissure resolves with this treatment, however, she may cancel that appointment.  I also discussed with her the next potential steps, if the ointment is not effective.  This includes the option of Botox injection versus lateral internal sphincterotomy.  I reassured her that we have had excellent results using the topical agent and I am hopeful she will have a similar excellent outcome.    Fredirick Maudlin 05/23/2020, 12:39 PM

## 2020-05-26 ENCOUNTER — Telehealth: Payer: Self-pay

## 2020-05-26 NOTE — Telephone Encounter (Signed)
Copied from [place] [id number]. Topic: General - Other >> May 26, 2020  4:30 PM Celene Kras wrote: Reason for CRM: Pt called and is requesting to have PCP or Nurse to give a call back to discuss heart monitor. Please advise.

## 2020-05-27 ENCOUNTER — Encounter: Payer: Self-pay | Admitting: Internal Medicine

## 2020-05-29 ENCOUNTER — Encounter: Payer: Self-pay | Admitting: Pulmonary Disease

## 2020-05-29 ENCOUNTER — Ambulatory Visit (INDEPENDENT_AMBULATORY_CARE_PROVIDER_SITE_OTHER): Payer: Medicare HMO | Admitting: Pulmonary Disease

## 2020-05-29 ENCOUNTER — Other Ambulatory Visit: Payer: Self-pay

## 2020-05-29 VITALS — BP 132/78 | HR 66 | Temp 97.5°F | Ht 66.0 in | Wt 160.2 lb

## 2020-05-29 DIAGNOSIS — R0602 Shortness of breath: Secondary | ICD-10-CM | POA: Diagnosis not present

## 2020-05-29 DIAGNOSIS — Z87891 Personal history of nicotine dependence: Secondary | ICD-10-CM

## 2020-05-29 DIAGNOSIS — R002 Palpitations: Secondary | ICD-10-CM | POA: Diagnosis not present

## 2020-05-29 NOTE — Patient Instructions (Signed)
We are going to get breathing tests.  We are also going to get a heart test and heart rhythm test that will tell us about your palpitations and about the artery that goes from the heart to the lungs.   We will see you in follow-up in 3 to 4 weeks time with either me or the nurse practitioner.  Call sooner should any new problems arise.

## 2020-05-29 NOTE — Progress Notes (Signed)
Subjective:    Patient ID: Claudia Gutierrez, female    DOB: 1951/01/26, 70 y.o.   MRN: [id number]  HPI Claudia Gutierrez is a 70 year old remote former smoker who presents for evaluation of shortness of breath with associated wheezing for a "couple" of years.  She is kindly referred by Dr. Halina Maidens.  The patient notes that her shortness of breath started very gradually.  She also notes that this shortness of breath can occur at rest and during exertion.  Usually also accompanied by palpitations.  She was given an inhaler several years ago but never used it.  Appears that this was an albuterol inhaler.  She has been on omeprazole for years due to gastroesophageal reflux symptoms and notes that these are well controlled as long as she takes the PPI.  She has not had any cough or sputum production.  She has not had any chest pain.  As noted she has had palpitations.  There is a strong family history of atrial fibrillation.  She has not noted that anything exacerbates nor improves on her symptoms.  As noted she has never tried an inhaler that she was previously given.  She has not had significant occupational exposure.  No history of asthma as a child.   Review of Systems A 10 point review of systems was performed and it is as noted above otherwise negative.  Past Medical History:  Diagnosis Date  . Acid reflux   . Anxiety   . Wears dentures    Full upper   Past Surgical History:  Procedure Laterality Date  . ACHILLES TENDON REPAIR  2008  . BIOPSY  10/21/2019   Procedure: BIOPSY;  Surgeon: Milus Banister, MD;  Location: [facility];  Service: Endoscopy;;  . BREAST CYST ASPIRATION Right 2010  . CATARACT EXTRACTION W/PHACO Right 12/21/2019   Procedure: CATARACT EXTRACTION PHACO AND INTRAOCULAR LENS PLACEMENT (IOC) RIGHT 4.15 00:31.2;  Surgeon: Birder Robson, MD;  Location: [facility];  Service: Ophthalmology;  Laterality: Right;  . CATARACT EXTRACTION W/PHACO Left 01/11/2020    Procedure: CATARACT EXTRACTION PHACO AND INTRAOCULAR LENS PLACEMENT (IOC) LEFT 4.78 00:26.5;  Surgeon: Birder Robson, MD;  Location: [place];  Service: Ophthalmology;  Laterality: Left;  . COLONOSCOPY WITH PROPOFOL N/A 09/13/2019   Procedure: COLONOSCOPY WITH PROPOFOL;  Surgeon: Jonathon Bellows, MD;  Location: [facility] END[facility];  Service: Gastroenterology;  Laterality: N/A;  . ESOPHAGOGASTRODUODENOSCOPY (EGD) WITH PROPOFOL N/A 10/21/2019   Procedure: ESOPHAGOGASTRODUODENOSCOPY (EGD) WITH PROPOFOL;  Surgeon: Milus Banister, MD;  [facility]: [facility];  Service: Endoscopy;  Laterality: N/A;  . EUS N/A 10/21/2019   Procedure: UPPER ENDOSCOPIC ULTRASOUND (EUS) RADIAL;  Surgeon: Milus Banister, MD;  Location: [facility];  Service: Endoscopy;  Laterality: N/A;  . FINE NEEDLE ASPIRATION N/A 10/21/2019   Procedure: FINE NEEDLE ASPIRATION (FNA) LINEAR;  Surgeon: Milus Banister, MD;  [facility]: [facility];  Service: Endoscopy;  Laterality: N/A;   Patient Active Problem List   Diagnosis Date Noted  . Anal fissure 05/23/2020  . Aortic atherosclerosis ([place]) 04/16/2020  . Sacro-iliac pain 04/14/2020  . Shortness of breath 04/14/2020  . Palpitation 04/14/2020  . History of colon polyps 10/05/2019  . Pancreatic cyst 10/05/2019  . GERD (gastroesophageal reflux disease) 10/12/2013  . Major depressive disorder, single episode, unspecified 10/12/2013   Family History  Problem Relation Age of Onset  . Breast cancer Sister 34       and stomach ca  . Diabetes Sister   . Colon  cancer Sister 8  . Lung cancer Father   . Diabetes Father   . Heart disease Father   . Hypertension Father   . Stroke Paternal Grandmother    Social History   Tobacco Use  . Smoking status: Former Smoker    Packs/day: 0.50    Years: 10.00    Pack years: 5.00    Types: Cigarettes    Quit date: 2002    Years since quitting: 20.1  . Smokeless tobacco: Never Used  Substance Use Topics  . Alcohol use: Yes     Comment: socially   Allergies  Allergen Reactions  . Azithromycin Other (See Comments)    SWELLING/EDEMA  . Paroxetine Hcl Nausea Only and Other (See Comments)    GI Upset  . Ciprofloxacin Itching    Phlebitis   . Sodium Clavulanate Diarrhea  . Codeine Nausea And Vomiting   Current Meds  Medication Sig  . citalopram (CELEXA) 20 MG tablet Take 1 tablet (20 mg total) by mouth daily.  . folic acid (FOLVITE) 1 MG tablet Take 1 mg by mouth daily.  . Multiple Vitamins-Minerals (HAIR SKIN AND NAILS FORMULA) TABS Take 1 tablet by mouth daily.  . Omega-3 Fatty Acids (FISH OIL) 1000 MG CAPS Take 1,000 mg by mouth daily.  Marland Kitchen omeprazole (PRILOSEC) 20 MG capsule Take 1 capsule (20 mg total) by mouth daily.  Marland Kitchen OVER THE COUNTER MEDICATION Take 250 mg by mouth daily. Keratin otc supplement  . traZODone (DESYREL) 100 MG tablet Take 1.5-2 tablets (150-200 mg total) by mouth at bedtime as needed. for sleep   Immunization History  Administered Date(s) Administered  . Fluad Quad(high Dose 65+) 02/07/2020  . Influenza, Seasonal, Injecte, Preservative Fre 01/13/2007, 01/08/2010, 01/13/2013  . Influenza,inj,Quad PF,6+ Mos 02/28/2017  . Influenza-Unspecified 12/30/2013  . Moderna Sars-Covid-2 Vaccination 05/15/2019, 06/11/2019  . Pneumococcal Conjugate-13 10/05/2019       Objective:   Physical Exam BP 132/78 (BP Location: Left Arm, Cuff Size: Normal)   Pulse 66   Temp (!) 97.5 F (36.4 C) (Temporal)   Ht 5\' 6"  (1.676 m)   Wt 160 lb 3.2 oz (72.7 kg)   SpO2 97%   BMI 25.86 kg/m  GENERAL: A well-developed, well-nourished, well-groomed.  No acute distress.  Fully ambulatory.  No conversational dyspnea. HEAD: Normocephalic, atraumatic.  EYES: Pupils equal, round, reactive to light.  No scleral icterus.  MOUTH: Nose/mouth/throat not examined due to masking requirements for COVID 19. NECK: Supple. No thyromegaly. Trachea midline. No JVD.  No adenopathy. PULMONARY: Good air entry bilaterally.  No  adventitious sounds. CARDIOVASCULAR: S1 and S2. Regular rate and rhythm.  No rubs murmurs or gallops heard. ABDOMEN: Benign. MUSCULOSKELETAL: No joint deformity, no clubbing, no edema.  NEUROLOGIC: No focal deficits noted.  No gait disturbance.  Speech is fluent. SKIN: Intact,warm,dry.  No rashes noted on limited exam. PSYCH: Mood and behavior is normal.       Assessment & Plan:     ICD-10-CM   1. Shortness of breath  R06.02 ECHOCARDIOGRAM COMPLETE    Pulmonary Function Test [facility] Only   Difficult to ascribe to pulmonary etiology Having issues with palpitations Will obtain PFTs and 2D echo  2. Palpitations  R00.2 ECHOCARDIOGRAM COMPLETE    LONG TERM MONITOR (3-14 DAYS)   Long-term monitor Has history of A. fib in the family Dyspnea may be related to intermittent A. fib  3. Former smoker  Z87.891    Remote    Orders Placed This Encounter  Procedures  .  Pulmonary Function Test [facility] Only    Standing Status:   Future    Standing Expiration Date:   05/29/2021    Scheduling Instructions:     4 weeks    Order Specific Question:   Full PFT: includes the following: basic spirometry, spirometry pre & post bronchodilator, diffusion capacity (DLCO), lung volumes    Answer:   Full PFT  . LONG TERM MONITOR (3-14 DAYS)    Standing Status:   Future    Standing Expiration Date:   05/29/2021    Order Specific Question:   Where should this test be performed?    Answer:   [facility]    Order Specific Question:   Does the patient have an implanted cardiac device?    Answer:   No    Order Specific Question:   Prescribed days of wear    Answer:   7  . ECHOCARDIOGRAM COMPLETE    Standing Status:   Future    Standing Expiration Date:   11/26/2020    Order Specific Question:   Where should this test be performed    Answer:   [facility]    Order Specific Question:   Perflutren DEFINITY (image enhancing agent) should be administered unless hypersensitivity or allergy exist    Answer:    Administer Perflutren    Order Specific Question:   Reason for exam-Echo    Answer:   Dyspnea  R06.00    Discussion:  Patient has had issues with dyspnea for several years.  I cannot ascribe this to pulmonary etiology.  She is having some cardiac symptoms mostly palpitations.  We will proceed with obtaining studies as noted above.  We will see her in follow-up in 3 to 4 weeks time she is to contact us prior to that time should any new difficulties arise.   Renold Don, MD [facility]   *This note was dictated using voice recognition software/Dragon.  Despite best efforts to proofread, errors can occur which can change the meaning.  Any change was purely unintentional.

## 2020-05-29 NOTE — Telephone Encounter (Signed)
Dr. Army Melia sent a message on my chart to patient to let her know to see pulmonology.

## 2020-06-03 ENCOUNTER — Other Ambulatory Visit: Payer: Self-pay | Admitting: Internal Medicine

## 2020-06-03 NOTE — Telephone Encounter (Signed)
Requested Prescriptions  Pending Prescriptions Disp Refills  . citalopram (CELEXA) 20 MG tablet [Pharmacy Med Name: Citalopram Hydrobromide 20 MG Oral Tablet] 90 tablet 0    Sig: Take 1 tablet by mouth once daily     Psychiatry:  Antidepressants - SSRI Passed - 06/03/2020 10:27 AM      Passed - Completed PHQ-2 or PHQ-9 in the last 360 days      Passed - Valid encounter within last 6 months    Recent Outpatient Visits          1 month ago Suspected COVID-19 virus infection   [place], [place]   1 month ago [place], MD   3 months ago Gastroesophageal reflux disease, unspecified whether esophagitis present   [facility] Glean Hess, MD   8 months ago Sacro-iliac pain   [facility] Glean Hess, MD      Future Appointments            In 8 months Army Melia Jesse Sans, MD [facility], [facility]

## 2020-06-13 ENCOUNTER — Telehealth: Payer: Self-pay

## 2020-06-13 NOTE — Telephone Encounter (Signed)
Lm to relay date/time of covid test prior to PFT.  [facility]16/[facility]

## 2020-06-14 NOTE — Telephone Encounter (Signed)
Lm x2 for patient.  Will close encounter per office protocol.   

## 2020-06-16 ENCOUNTER — Other Ambulatory Visit
Admission: RE | Admit: 2020-06-16 | Discharge: 2020-06-16 | Disposition: A | Discharge status: Home / Self Care | Payer: Medicare HMO | Source: Ambulatory Visit | Attending: Pulmonary Disease | Admitting: Pulmonary Disease

## 2020-06-16 ENCOUNTER — Other Ambulatory Visit: Payer: Self-pay

## 2020-06-16 DIAGNOSIS — Z01812 Encounter for preprocedural laboratory examination: Secondary | ICD-10-CM | POA: Insufficient documentation

## 2020-06-16 DIAGNOSIS — Z20822 Contact with and (suspected) exposure to covid-19: Secondary | ICD-10-CM | POA: Diagnosis not present

## 2020-06-16 LAB — SARS CORONAVIRUS 2 (TAT 6-24 HRS): SARS Coronavirus 2: NEGATIVE

## 2020-06-19 ENCOUNTER — Ambulatory Visit (INDEPENDENT_AMBULATORY_CARE_PROVIDER_SITE_OTHER): Payer: Medicare HMO

## 2020-06-19 ENCOUNTER — Other Ambulatory Visit: Payer: Self-pay

## 2020-06-19 ENCOUNTER — Ambulatory Visit: Payer: Medicare HMO | Attending: Pulmonary Disease

## 2020-06-19 DIAGNOSIS — R002 Palpitations: Secondary | ICD-10-CM

## 2020-06-19 DIAGNOSIS — R0602 Shortness of breath: Secondary | ICD-10-CM | POA: Diagnosis not present

## 2020-06-19 LAB — ECHOCARDIOGRAM COMPLETE
AV Vena cont: 0.35 cm
Area-P 1/2: 3.91 cm2
Calc EF: 69.5 %
P 1/2 time: 485 msec
S' Lateral: 2.8 cm
Single Plane A2C EF: 55.4 %
Single Plane A4C EF: 79.4 %

## 2020-06-20 ENCOUNTER — Telehealth: Payer: Self-pay | Admitting: General Surgery

## 2020-06-20 ENCOUNTER — Encounter: Payer: Self-pay | Admitting: General Surgery

## 2020-06-20 ENCOUNTER — Other Ambulatory Visit: Payer: Self-pay

## 2020-06-20 ENCOUNTER — Telehealth: Payer: Self-pay | Admitting: Pulmonary Disease

## 2020-06-20 ENCOUNTER — Ambulatory Visit (INDEPENDENT_AMBULATORY_CARE_PROVIDER_SITE_OTHER): Payer: MEDICARE | Admitting: General Surgery

## 2020-06-20 VITALS — BP 139/76 | HR 84 | Temp 98.1°F | Ht 66.0 in | Wt 160.6 lb

## 2020-06-20 DIAGNOSIS — K602 Anal fissure, unspecified: Secondary | ICD-10-CM

## 2020-06-20 NOTE — Telephone Encounter (Signed)
Patient has been advised of Pre-Admission date/time, COVID Testing date and Surgery date.  Surgery Date: 06/26/20 Preadmission Testing Date: 06/22/20 (phone 1p-5p) Covid Testing Date: 06/23/20 in person at 12:05 pm - patient advised to go to the [facility]   Patient has been made aware to call [phone], between 1-3:00pm the day before surgery, to find out what time to arrive for surgery.

## 2020-06-20 NOTE — H&P (View-Only) (Signed)
Patient ID: Claudia Gutierrez, female   DOB: July 13, 1950, 70 y.o.   MRN: [id number]  Chief Complaint  Patient presents with  . Follow-up    Anal fissure    HPI Claudia Gutierrez is a 70 y.o. female.   I initially saw her 1 month ago.  My HPI from that visit is copied here:  "She has been referred by her primary care provider, Dr. Doyle Askew, for surgical evaluation of hemorrhoidal disease.  Ms. Acklin states that she has had hemorrhoids for many years, but mostly they resolve on their own.  Approximately a month ago, she developed significant diarrhea secondary to antibiotic use.  Since then, she says that she feels very swollen inside.  She has noticed a little bit of bright red blood on her toilet paper after bowel movements.  She states that she has burning and itching and pain in the area.  She has not been performing sitz baths, but did try Preparation H.  She reports that it burned significantly on contact, so she did not use it further.  She also applied Desitin with little relief.  She had a recent colonoscopy, in June 2021.  Notably, no hemorrhoids were appreciated either externally or internally.  I personally reviewed the images taken of the retroflexion views and agree that no hemorrhoids are seen."  Physical examination at the time of that visit did not demonstrate any hemorrhoids, internal or external.  It did, however, reveal the presence of a posterior anal fissure.  She was prescribed topical nifedipine/lidocaine and was scheduled to return in 6 weeks for reevaluation.  She contacted our office asking for a sooner appointment, as she was not receiving any relief from the treatment.  She continues to have diarrhea.  She says that she is taking acidophilus and will occasionally use Imodium from time to time if it is very watery.  No further evaluation into the etiology of her diarrhea has been undertaken since her last visit here.   Past Medical History:  Diagnosis Date  . Acid reflux    . Anxiety   . Wears dentures    Full upper    Past Surgical History:  Procedure Laterality Date  . ACHILLES TENDON REPAIR  2008  . BIOPSY  10/21/2019   Procedure: BIOPSY;  Surgeon: Milus Banister, MD;  [facility];  Service: Endoscopy;;  . BREAST CYST ASPIRATION Right 2010  . CATARACT EXTRACTION W/PHACO Right 12/21/2019   Procedure: CATARACT EXTRACTION PHACO AND INTRAOCULAR LENS PLACEMENT (IOC) RIGHT 4.15 00:31.2;  Surgeon: Birder Robson, MD;  Location: [place];  Service: Ophthalmology;  Laterality: Right;  . CATARACT EXTRACTION W/PHACO Left 01/11/2020   Procedure: CATARACT EXTRACTION PHACO AND INTRAOCULAR LENS PLACEMENT (IOC) LEFT 4.78 00:26.5;  Surgeon: Birder Robson, MD;  Location: [facility];  Service: Ophthalmology;  Laterality: Left;  . COLONOSCOPY WITH PROPOFOL N/A 09/13/2019   Procedure: COLONOSCOPY WITH PROPOFOL;  Surgeon: Jonathon Bellows, MD;  Location: [facility] END[facility]COPY;  Service: Gastroenterology;  Laterality: N/A;  . ESOPHAGOGASTRODUODENOSCOPY (EGD) WITH PROPOFOL N/A 10/21/2019   Procedure: ESOPHAGOGASTRODUODENOSCOPY (EGD) WITH PROPOFOL;  Surgeon: Milus Banister, MD;  [facility];  Service: Endoscopy;  Laterality: N/A;  . EUS N/A 10/21/2019   Procedure: UPPER ENDOSCOPIC ULTRASOUND (EUS) RADIAL;  Surgeon: Milus Banister, MD;  [facility];  Service: Endoscopy;  Laterality: N/A;  . FINE NEEDLE ASPIRATION N/A 10/21/2019   Procedure: FINE NEEDLE ASPIRATION (FNA) LINEAR;  Surgeon: Milus Banister, MD;  [facility];  Service: Endoscopy;  Laterality: N/A;    Family History  Problem Relation Age of Onset  . Breast cancer Sister 71       and stomach ca  . Diabetes Sister   . Colon cancer Sister 65  . Lung cancer Father   . Diabetes Father   . Heart disease Father   . Hypertension Father   . Stroke Paternal Grandmother     Social History Social History   Tobacco Use  . Smoking status: Former Smoker     Packs/day: 0.50    Years: 10.00    Pack years: 5.00    Types: Cigarettes    Quit date: 2002    Years since quitting: 20.2  . Smokeless tobacco: Never Used  Vaping Use  . Vaping Use: Never used  Substance Use Topics  . Alcohol use: Yes    Comment: socially  . Drug use: Never    Allergies  Allergen Reactions  . Azithromycin Other (See Comments)    SWELLING/EDEMA  . Paroxetine Hcl Nausea Only and Other (See Comments)    GI Upset  . Ciprofloxacin Itching    Phlebitis   . Sodium Clavulanate Diarrhea  . Codeine Nausea And Vomiting    Current Outpatient Medications  Medication Sig Dispense Refill  . citalopram (CELEXA) 20 MG tablet Take 1 tablet by mouth once daily 90 tablet 0  . folic acid (FOLVITE) 1 MG tablet Take 1 mg by mouth daily.    . Multiple Vitamins-Minerals (HAIR SKIN AND NAILS FORMULA) TABS Take 1 tablet by mouth daily.    . Omega-3 Fatty Acids (FISH OIL) 1000 MG CAPS Take 1,000 mg by mouth daily.    Marland Kitchen omeprazole (PRILOSEC) 20 MG capsule Take 1 capsule (20 mg total) by mouth daily. 90 capsule 0  . OVER THE COUNTER MEDICATION Take 250 mg by mouth daily. Keratin otc supplement    . traZODone (DESYREL) 100 MG tablet Take 1.5-2 tablets (150-200 mg total) by mouth at bedtime as needed. for sleep 180 tablet 1   No current facility-administered medications for this visit.     Blood pressure 139/76, pulse 84, temperature 98.1 F (36.7 C), temperature source Oral, height 5\' 6"  (1.676 m), weight 160 lb 9.6 oz (72.8 kg). Body mass index is 25.92 kg/m.  Physical Exam Physical Exam Constitutional:      General: She is not in acute distress.    Appearance: She is normal weight.  HENT:     Head: Normocephalic and atraumatic.     Nose:     Comments: Covered with a mask    Mouth/Throat:     Comments: Covered with a mask Eyes:     General:        Right eye: No discharge.        Left eye: No discharge.  Cardiovascular:     Rate and Rhythm: Normal rate.  Pulmonary:      Effort: Pulmonary effort is normal. No respiratory distress.  Abdominal:     Palpations: Abdomen is soft.  Genitourinary:      Comments: Posterior anal fissure present.  Digital rectal exam demonstrates high sphincter tone, no gross blood present. Musculoskeletal:        General: No deformity or signs of injury.  Skin:    General: Skin is warm and dry.  Neurological:     General: No focal deficit present.     Mental Status: She is alert.  Psychiatric:        Mood and  Affect: Mood normal.        Behavior: Behavior normal.     Data Reviewed No new relevant data available for review.  She has seen pulmonology for evaluation of dyspnea.  Assessment This is a 70 year old woman with a posterior anal fissure.  It has not been responsive to topical nifedipine/lidocaine ointment.  I have recommended that she undergo Botox injection of the internal anal sphincter.  Plan The risks of the procedure were discussed with the patient.  These include, but are not limited to, allergic reaction to Botox, temporary stool leakage or inability to control passage of flatus, failure of the treatment to resolve the fissure, need for further evaluation and treatment.  She agreed to accept these risks and we will work on getting her scheduled.    Fredirick Maudlin 06/20/2020, 10:13 AM

## 2020-06-20 NOTE — Progress Notes (Signed)
Patient ID: Claudia Gutierrez, female   DOB: 1951/03/30, 70 y.o.   MRN: [id number]  Chief Complaint  Patient presents with  . Follow-up    Anal fissure    HPI Claudia Gutierrez is a 70 y.o. female.   I initially saw her 1 month ago.  My HPI from that visit is copied here:  "She has been referred by her primary care provider, Dr. Doyle Gutierrez, for surgical evaluation of hemorrhoidal disease.  Claudia Gutierrez states that she has had hemorrhoids for many years, but mostly they resolve on their own.  Approximately a month ago, she developed significant diarrhea secondary to antibiotic use.  Since then, she says that she feels very swollen inside.  She has noticed a little bit of bright red blood on her toilet paper after bowel movements.  She states that she has burning and itching and pain in the area.  She has not been performing sitz baths, but did try Preparation H.  She reports that it burned significantly on contact, so she did not use it further.  She also applied Desitin with little relief.  She had a recent colonoscopy, in June 2021.  Notably, no hemorrhoids were appreciated either externally or internally.  I personally reviewed the images taken of the retroflexion views and agree that no hemorrhoids are seen."  Physical examination at the time of that visit did not demonstrate any hemorrhoids, internal or external.  It did, however, reveal the presence of a posterior anal fissure.  She was prescribed topical nifedipine/lidocaine and was scheduled to return in 6 weeks for reevaluation.  She contacted our office asking for a sooner appointment, as she was not receiving any relief from the treatment.  She continues to have diarrhea.  She says that she is taking acidophilus and will occasionally use Imodium from time to time if it is very watery.  No further evaluation into the etiology of her diarrhea has been undertaken since her last visit here.   Past Medical History:  Diagnosis Date  . Acid reflux    . Anxiety   . Wears dentures    Full upper    Past Surgical History:  Procedure Laterality Date  . ACHILLES TENDON REPAIR  2008  . BIOPSY  10/21/2019   Procedure: BIOPSY;  Surgeon: Milus Banister, MD;  Location: [facility];  Service: Endoscopy;;  . BREAST CYST ASPIRATION Right 2010  . CATARACT EXTRACTION W/PHACO Right 12/21/2019   Procedure: CATARACT EXTRACTION PHACO AND INTRAOCULAR LENS PLACEMENT (IOC) RIGHT 4.15 00:31.2;  Surgeon: Birder Robson, MD;  Location: [facility];  Service: Ophthalmology;  Laterality: Right;  . CATARACT EXTRACTION W/PHACO Left 01/11/2020   Procedure: CATARACT EXTRACTION PHACO AND INTRAOCULAR LENS PLACEMENT (IOC) LEFT 4.78 00:26.5;  Surgeon: Birder Robson, MD;  Location: [facility];  Service: Ophthalmology;  Laterality: Left;  . COLONOSCOPY WITH PROPOFOL N/A 09/13/2019   Procedure: COLONOSCOPY WITH PROPOFOL;  Surgeon: Jonathon Bellows, MD;  Location: [facility];  Service: Gastroenterology;  Laterality: N/A;  . ESOPHAGOGASTRODUODENOSCOPY (EGD) WITH PROPOFOL N/A 10/21/2019   Procedure: ESOPHAGOGASTRODUODENOSCOPY (EGD) WITH PROPOFOL;  Surgeon: Milus Banister, MD;  Location: [facility];  Service: Endoscopy;  Laterality: N/A;  . EUS N/A 10/21/2019   Procedure: UPPER ENDOSCOPIC ULTRASOUND (EUS) RADIAL;  Surgeon: Milus Banister, MD;  Location: [facility];  Service: Endoscopy;  Laterality: N/A;  . FINE NEEDLE ASPIRATION N/A 10/21/2019   Procedure: FINE NEEDLE ASPIRATION (FNA) LINEAR;  Surgeon: Milus Banister, MD;  Location: [facility];  Service: Endoscopy;  Laterality: N/A;    Family History  Problem Relation Age of Onset  . Breast cancer Sister 35       and stomach ca  . Diabetes Sister   . Colon cancer Sister 102  . Lung cancer Father   . Diabetes Father   . Heart disease Father   . Hypertension Father   . Stroke Paternal Grandmother     Social History Social History   Tobacco Use  . Smoking status: Former Smoker     Packs/day: 0.50    Years: 10.00    Pack years: 5.00    Types: Cigarettes    Quit date: 2002    Years since quitting: 20.2  . Smokeless tobacco: Never Used  Vaping Use  . Vaping Use: Never used  Substance Use Topics  . Alcohol use: Yes    Comment: socially  . Drug use: Never    Allergies  Allergen Reactions  . Azithromycin Other (See Comments)    SWELLING/EDEMA  . Paroxetine Hcl Nausea Only and Other (See Comments)    GI Upset  . Ciprofloxacin Itching    Phlebitis   . Sodium Clavulanate Diarrhea  . Codeine Nausea And Vomiting    Current Outpatient Medications  Medication Sig Dispense Refill  . citalopram (CELEXA) 20 MG tablet Take 1 tablet by mouth once daily 90 tablet 0  . folic acid (FOLVITE) 1 MG tablet Take 1 mg by mouth daily.    . Multiple Vitamins-Minerals (HAIR SKIN AND NAILS FORMULA) TABS Take 1 tablet by mouth daily.    . Omega-3 Fatty Acids (FISH OIL) 1000 MG CAPS Take 1,000 mg by mouth daily.    Marland Kitchen omeprazole (PRILOSEC) 20 MG capsule Take 1 capsule (20 mg total) by mouth daily. 90 capsule 0  . OVER THE COUNTER MEDICATION Take 250 mg by mouth daily. Keratin otc supplement    . traZODone (DESYREL) 100 MG tablet Take 1.5-2 tablets (150-200 mg total) by mouth at bedtime as needed. for sleep 180 tablet 1   No current facility-administered medications for this visit.     Blood pressure 139/76, pulse 84, temperature 98.1 F (36.7 C), temperature source Oral, height 5\' 6"  (1.676 m), weight 160 lb 9.6 oz (72.8 kg). Body mass index is 25.92 kg/m.  Physical Exam Physical Exam Constitutional:      General: She is not in acute distress.    Appearance: She is normal weight.  HENT:     Head: Normocephalic and atraumatic.     Nose:     Comments: Covered with a mask    Mouth/Throat:     Comments: Covered with a mask Eyes:     General:        Right eye: No discharge.        Left eye: No discharge.  Cardiovascular:     Rate and Rhythm: Normal rate.  Pulmonary:      Effort: Pulmonary effort is normal. No respiratory distress.  Abdominal:     Palpations: Abdomen is soft.  Genitourinary:      Comments: Posterior anal fissure present.  Digital rectal exam demonstrates high sphincter tone, no gross blood present. Musculoskeletal:        General: No deformity or signs of injury.  Skin:    General: Skin is warm and dry.  Neurological:     General: No focal deficit present.     Mental Status: She is alert.  Psychiatric:        Mood and  Affect: Mood normal.        Behavior: Behavior normal.     Data Reviewed No new relevant data available for review.  She has seen pulmonology for evaluation of dyspnea.  Assessment This is a 70 year old woman with a posterior anal fissure.  It has not been responsive to topical nifedipine/lidocaine ointment.  I have recommended that she undergo Botox injection of the internal anal sphincter.  Plan The risks of the procedure were discussed with the patient.  These include, but are not limited to, allergic reaction to Botox, temporary stool leakage or inability to control passage of flatus, failure of the treatment to resolve the fissure, need for further evaluation and treatment.  She agreed to accept these risks and we will work on getting her scheduled.    Fredirick Maudlin 06/20/2020, 10:13 AM

## 2020-06-20 NOTE — Patient Instructions (Signed)
Our surgery scheduler Pamala Hurry will call you in the next 24- 48 hours to schedule your surgery. When speaking with her please have your blue surgery sheet available.

## 2020-06-20 NOTE — Telephone Encounter (Signed)
I called and spoke with Claudia Gutierrez and explained that her PFT wouldn't be rescheduled until April. We are waiting for them to give [place] PFT appt dates

## 2020-06-21 NOTE — Telephone Encounter (Signed)
I have left the patient a message to call and reschedule her PFT and Covid

## 2020-06-22 ENCOUNTER — Other Ambulatory Visit: Payer: Self-pay

## 2020-06-22 ENCOUNTER — Other Ambulatory Visit
Admission: RE | Admit: 2020-06-22 | Discharge: 2020-06-22 | Disposition: A | Discharge status: Home / Self Care | Payer: Medicare HMO | Source: Ambulatory Visit | Attending: General Surgery | Admitting: General Surgery

## 2020-06-22 DIAGNOSIS — Z01812 Encounter for preprocedural laboratory examination: Secondary | ICD-10-CM | POA: Insufficient documentation

## 2020-06-22 DIAGNOSIS — H02834 Dermatochalasis of left upper eyelid: Secondary | ICD-10-CM | POA: Diagnosis not present

## 2020-06-22 DIAGNOSIS — H02831 Dermatochalasis of right upper eyelid: Secondary | ICD-10-CM | POA: Diagnosis not present

## 2020-06-22 DIAGNOSIS — Z20822 Contact with and (suspected) exposure to covid-19: Secondary | ICD-10-CM | POA: Insufficient documentation

## 2020-06-22 HISTORY — DX: Other complications of anesthesia, initial encounter: T88.59XA

## 2020-06-22 HISTORY — DX: Other specified postprocedural states: Z98.890

## 2020-06-22 HISTORY — DX: Personal history of urinary calculi: Z87.442

## 2020-06-22 HISTORY — DX: Nausea with vomiting, unspecified: R11.2

## 2020-06-22 NOTE — Patient Instructions (Addendum)
Your procedure is scheduled on: 06/26/20 - Monday Report to the Registration Desk on the 1st floor of the [facility]. To find out your arrival time, please call [phone] between 1PM - 3PM on: 06/23/20- Friday  REMEMBER: Instructions that are not followed completely may result in serious medical risk, up to and including death; or upon the discretion of your surgeon and anesthesiologist your surgery may need to be rescheduled.  Do not eat food or fluids after midnight the night before surgery.  No gum chewing, lozengers or hard candies.  TAKE THESE MEDICATIONS THE MORNING OF SURGERY WITH A SIP OF WATER: - omeprazole (PRILOSEC) 20 MG capsule, take one the night before and one on the morning of surgery - helps to prevent nausea after surgery. - citalopram (CELEXA) 20 MG tablet  One week prior to surgery: Stop Anti-inflammatories (NSAIDS) such as Advil, Aleve, Ibuprofen, Motrin, Naproxen, Naprosyn and Aspirin based products such as Excedrin, Goodys Powder, BC Powder.  Stop ANY OVER THE COUNTER supplements until after surgery.  No Alcohol for 24 hours before or after surgery.  No Smoking including e-cigarettes for 24 hours prior to surgery.  No chewable tobacco products for at least 6 hours prior to surgery.  No nicotine patches on the day of surgery.  Do not use any "recreational" drugs for at least a week prior to your surgery.  Please be advised that the combination of cocaine and anesthesia may have negative outcomes, up to and including death. If you test positive for cocaine, your surgery will be cancelled.  On the morning of surgery brush your teeth with toothpaste and water, you may rinse your mouth with mouthwash if you wish. Do not swallow any toothpaste or mouthwash.  Do not wear jewelry, make-up, hairpins, clips or nail polish.  Do not wear lotions, powders, or perfumes.   Do not shave body from the neck down 48 hours prior to surgery just in case you cut yourself  which could leave a site for infection.  Also, freshly shaved skin may become irritated if using the CHG soap.  Contact lenses, hearing aids and dentures may not be worn into surgery.  Do not bring valuables to the hospital. [facility] is not responsible for any missing/lost belongings or valuables.   Fleets enema or Magnesium Citrate x 2 as directed.  Notify your doctor if there is any change in your medical condition (cold, fever, infection).  Wear comfortable clothing (specific to your surgery type) to the hospital.  Plan for stool softeners for home use; pain medications have a tendency to cause constipation. You can also help prevent constipation by eating foods high in fiber such as fruits and vegetables and drinking plenty of fluids as your diet allows.  After surgery, you can help prevent lung complications by doing breathing exercises.  Take deep breaths and cough every 1-2 hours. Your doctor may order a device called an Incentive Spirometer to help you take deep breaths. When coughing or sneezing, hold a pillow firmly against your incision with both hands. This is called "splinting." Doing this helps protect your incision. It also decreases belly discomfort.  If you are being admitted to the hospital overnight, leave your suitcase in the car. After surgery it may be brought to your room.  If you are being discharged the day of surgery, you will not be allowed to drive home. You will need a responsible adult (18 years or older) to drive you home and stay with you that night.  If you are taking public transportation, you will need to have a responsible adult (18 years or older) with you. Please confirm with your physician that it is acceptable to use public transportation.   Please call the [facility] at [phone] if you have any questions about these instructions.  Surgery Visitation Policy:  Patients undergoing a surgery or procedure may have one  family member or support person with them as long as that person is not COVID-19 positive or experiencing its symptoms.  That person may remain in the waiting area during the procedure.  Inpatient Visitation:    Visiting hours are 7 a.m. to 8 p.m. Inpatients will be allowed two visitors daily. The visitors may change each day during the patient's stay. No visitors under the age of 47. Any visitor under the age of 81 must be accompanied by an adult. The visitor must pass COVID-19 screenings, use hand sanitizer when entering and exiting the patient's room and wear a mask at all times, including in the patient's room. Patients must also wear a mask when staff or their visitor are in the room. Masking is required regardless of vaccination status.

## 2020-06-23 ENCOUNTER — Telehealth: Payer: Self-pay | Admitting: General Surgery

## 2020-06-23 ENCOUNTER — Other Ambulatory Visit
Admission: RE | Admit: 2020-06-23 | Discharge: 2020-06-23 | Disposition: A | Discharge status: Home / Self Care | Payer: Medicare HMO | Source: Ambulatory Visit | Attending: General Surgery | Admitting: General Surgery

## 2020-06-23 DIAGNOSIS — Z20822 Contact with and (suspected) exposure to covid-19: Secondary | ICD-10-CM | POA: Diagnosis not present

## 2020-06-23 DIAGNOSIS — Z01812 Encounter for preprocedural laboratory examination: Secondary | ICD-10-CM | POA: Diagnosis not present

## 2020-06-23 LAB — CBC
HCT: 38.8 % (ref 36.0–46.0)
Hemoglobin: 12.4 g/dL (ref 12.0–15.0)
MCH: 30.1 pg (ref 26.0–34.0)
MCHC: 32 g/dL (ref 30.0–36.0)
MCV: 94.2 fL (ref 80.0–100.0)
Platelets: 171 10*3/uL (ref 150–400)
RBC: 4.12 MIL/uL (ref 3.87–5.11)
RDW: 13.8 % (ref 11.5–15.5)
WBC: 4 10*3/uL (ref 4.0–10.5)
nRBC: 0 % (ref 0.0–0.2)

## 2020-06-23 LAB — BASIC METABOLIC PANEL
Anion gap: 8 (ref 5–15)
BUN: 14 mg/dL (ref 8–23)
CO2: 26 mmol/L (ref 22–32)
Calcium: 9.3 mg/dL (ref 8.9–10.3)
Chloride: 106 mmol/L (ref 98–111)
Creatinine, Ser: 0.8 mg/dL (ref 0.44–1.00)
GFR, Estimated: >60 mL/min (ref >60)
Glucose, Bld: 110 mg/dL — ABNORMAL HIGH (ref 70–99)
Potassium: 4 mmol/L (ref 3.5–5.1)
Sodium: 140 mmol/L (ref 135–145)

## 2020-06-23 NOTE — Telephone Encounter (Signed)
Per Dr Celine Ahr she would like the patient to do a fleets enema the night prior to surgery and the morning of surgery also. I spoke with the patient and relayed this information.

## 2020-06-23 NOTE — Telephone Encounter (Signed)
Pt called aware that she is scheduled for surgery w/Dr. Celine Ahr on Monday, & wanted to know if she will need to prep by using an enema or not.  She had her pre-admit interview yesterday & was encouraged to follow up w/the office to confirm.  Claudia Gutierrez is aware Dr. Celine Ahr is in the OR currently but will respond once available & she is due to arrive for COVID testing & lab work @ noon today.  Thank you

## 2020-06-23 NOTE — Telephone Encounter (Signed)
I have spoke with Claudia Gutierrez today and she is aware that the Covid Test is scheduled on 07/05/20 @ 9:15am and the PFT Test is scheduled on 07/06/20 @ 2:00pm

## 2020-06-24 LAB — SARS CORONAVIRUS 2 (TAT 6-24 HRS): SARS Coronavirus 2: NEGATIVE

## 2020-06-26 ENCOUNTER — Ambulatory Visit: Payer: Medicare HMO | Admitting: Anesthesiology

## 2020-06-26 ENCOUNTER — Ambulatory Visit: Payer: Medicare HMO | Admitting: Urgent Care

## 2020-06-26 ENCOUNTER — Encounter
Admission: RE | Disposition: A | Discharge status: Home / Self Care | Payer: Self-pay | Source: Home / Self Care | Attending: General Surgery

## 2020-06-26 ENCOUNTER — Encounter: Payer: Self-pay | Admitting: General Surgery

## 2020-06-26 ENCOUNTER — Ambulatory Visit
Admission: RE | Admit: 2020-06-26 | Discharge: 2020-06-26 | Disposition: A | Discharge status: Home / Self Care | Payer: Medicare HMO | Attending: General Surgery | Admitting: General Surgery

## 2020-06-26 DIAGNOSIS — Z885 Allergy status to narcotic agent status: Secondary | ICD-10-CM | POA: Insufficient documentation

## 2020-06-26 DIAGNOSIS — Z880 Allergy status to penicillin: Secondary | ICD-10-CM | POA: Diagnosis not present

## 2020-06-26 DIAGNOSIS — K648 Other hemorrhoids: Secondary | ICD-10-CM | POA: Diagnosis not present

## 2020-06-26 DIAGNOSIS — Z87891 Personal history of nicotine dependence: Secondary | ICD-10-CM | POA: Diagnosis not present

## 2020-06-26 DIAGNOSIS — Z8249 Family history of ischemic heart disease and other diseases of the circulatory system: Secondary | ICD-10-CM | POA: Insufficient documentation

## 2020-06-26 DIAGNOSIS — K219 Gastro-esophageal reflux disease without esophagitis: Secondary | ICD-10-CM | POA: Insufficient documentation

## 2020-06-26 DIAGNOSIS — K602 Anal fissure, unspecified: Secondary | ICD-10-CM

## 2020-06-26 DIAGNOSIS — Z803 Family history of malignant neoplasm of breast: Secondary | ICD-10-CM | POA: Diagnosis not present

## 2020-06-26 DIAGNOSIS — Z79899 Other long term (current) drug therapy: Secondary | ICD-10-CM | POA: Diagnosis not present

## 2020-06-26 DIAGNOSIS — Z823 Family history of stroke: Secondary | ICD-10-CM | POA: Insufficient documentation

## 2020-06-26 DIAGNOSIS — Z881 Allergy status to other antibiotic agents status: Secondary | ICD-10-CM | POA: Diagnosis not present

## 2020-06-26 DIAGNOSIS — Z833 Family history of diabetes mellitus: Secondary | ICD-10-CM | POA: Diagnosis not present

## 2020-06-26 DIAGNOSIS — Z800 Family history of malignant neoplasm of digestive organs: Secondary | ICD-10-CM | POA: Diagnosis not present

## 2020-06-26 HISTORY — PX: BOTOX INJECTION: SHX5754

## 2020-06-26 HISTORY — PX: HEMORRHOID SURGERY: SHX153

## 2020-06-26 SURGERY — BOTOX INJECTION
Anesthesia: General

## 2020-06-26 MED ORDER — MIDAZOLAM HCL 2 MG/2ML IJ SOLN
INTRAMUSCULAR | Status: AC
  Filled 2020-06-26: qty 2

## 2020-06-26 MED ORDER — CHLORHEXIDINE GLUCONATE 0.12 % MT SOLN: 15.0000 mL | Freq: Once | OROMUCOSAL | Status: AC

## 2020-06-26 MED ORDER — FLEET ENEMA 7-19 GM/118ML RE ENEM: 1.0000 | ENEMA | Freq: Once | RECTAL | Status: DC

## 2020-06-26 MED ORDER — SUGAMMADEX SODIUM 200 MG/2ML IV SOLN
INTRAVENOUS | Status: DC | PRN
  Administered 2020-06-26: 200 mg via INTRAVENOUS

## 2020-06-26 MED ORDER — DEXAMETHASONE SODIUM PHOSPHATE 10 MG/ML IJ SOLN
INTRAMUSCULAR | Status: DC | PRN
  Administered 2020-06-26: 10 mg via INTRAVENOUS

## 2020-06-26 MED ORDER — IBUPROFEN 800 MG PO TABS
ORAL_TABLET | ORAL | Status: AC
  Filled 2020-06-26: qty 1

## 2020-06-26 MED ORDER — IBUPROFEN 800 MG PO TABS: 800.0000 mg | ORAL_TABLET | Freq: Three times a day (TID) | ORAL | 0 refills | Status: DC | PRN

## 2020-06-26 MED ORDER — LIDOCAINE HCL (CARDIAC) PF 100 MG/5ML IV SOSY
PREFILLED_SYRINGE | INTRAVENOUS | Status: DC | PRN
  Administered 2020-06-26: 100 mg via INTRAVENOUS

## 2020-06-26 MED ORDER — ONABOTULINUMTOXINA 100 UNITS IJ SOLR
20.0000 [IU] | Freq: Once | INTRAMUSCULAR | Status: AC
  Administered 2020-06-26 (×2): 20 [IU] via INTRAMUSCULAR
  Filled 2020-06-26: qty 100

## 2020-06-26 MED ORDER — FENTANYL CITRATE (PF) 100 MCG/2ML IJ SOLN
INTRAMUSCULAR | Status: AC
  Filled 2020-06-26: qty 2

## 2020-06-26 MED ORDER — ACETAMINOPHEN 500 MG PO TABS: 1000.0000 mg | ORAL_TABLET | ORAL | Status: AC

## 2020-06-26 MED ORDER — BUPIVACAINE LIPOSOME 1.3 % IJ SUSP
INTRAMUSCULAR | Status: DC | PRN
  Administered 2020-06-26: 10 mL

## 2020-06-26 MED ORDER — BUPIVACAINE LIPOSOME 1.3 % IJ SUSP
INTRAMUSCULAR | Status: AC
  Filled 2020-06-26: qty 20

## 2020-06-26 MED ORDER — CHLORHEXIDINE GLUCONATE CLOTH 2 % EX PADS: 6.0000 | MEDICATED_PAD | Freq: Once | CUTANEOUS | Status: DC

## 2020-06-26 MED ORDER — IBUPROFEN 800 MG PO TABS
800.0000 mg | ORAL_TABLET | Freq: Three times a day (TID) | ORAL | Status: DC | PRN
  Administered 2020-06-26: 800 mg via ORAL
  Filled 2020-06-26 (×2): qty 1

## 2020-06-26 MED ORDER — ACETAMINOPHEN 500 MG PO TABS: 1000.0000 mg | ORAL_TABLET | Freq: Four times a day (QID) | ORAL | 0 refills | Status: AC | PRN

## 2020-06-26 MED ORDER — ACETAMINOPHEN 500 MG PO TABS
ORAL_TABLET | ORAL | Status: AC
  Administered 2020-06-26: 1000 mg via ORAL
  Filled 2020-06-26: qty 2

## 2020-06-26 MED ORDER — PROPOFOL 10 MG/ML IV BOLUS
INTRAVENOUS | Status: AC
  Filled 2020-06-26: qty 40

## 2020-06-26 MED ORDER — CHLORHEXIDINE GLUCONATE 0.12 % MT SOLN
OROMUCOSAL | Status: AC
  Administered 2020-06-26: 15 mL via OROMUCOSAL
  Filled 2020-06-26: qty 15

## 2020-06-26 MED ORDER — BUPIVACAINE LIPOSOME 1.3 % IJ SUSP: 20.0000 mL | Freq: Once | INTRAMUSCULAR | Status: DC

## 2020-06-26 MED ORDER — PROPOFOL 10 MG/ML IV BOLUS
INTRAVENOUS | Status: DC | PRN
  Administered 2020-06-26: 150 mg via INTRAVENOUS

## 2020-06-26 MED ORDER — LACTATED RINGERS IV SOLN: INTRAVENOUS | Status: DC

## 2020-06-26 MED ORDER — GLYCOPYRROLATE 0.2 MG/ML IJ SOLN
INTRAMUSCULAR | Status: DC | PRN
  Administered 2020-06-26: .2 mg via INTRAVENOUS

## 2020-06-26 MED ORDER — ONDANSETRON HCL 4 MG/2ML IJ SOLN
INTRAMUSCULAR | Status: AC
  Filled 2020-06-26: qty 2

## 2020-06-26 MED ORDER — ROCURONIUM BROMIDE 100 MG/10ML IV SOLN
INTRAVENOUS | Status: DC | PRN
  Administered 2020-06-26: 40 mg via INTRAVENOUS

## 2020-06-26 MED ORDER — FENTANYL CITRATE (PF) 100 MCG/2ML IJ SOLN
INTRAMUSCULAR | Status: DC | PRN
  Administered 2020-06-26: 50 ug via INTRAVENOUS
  Administered 2020-06-26 (×2): 25 ug via INTRAVENOUS

## 2020-06-26 MED ORDER — ONDANSETRON HCL 4 MG/2ML IJ SOLN
4.0000 mg | Freq: Once | INTRAMUSCULAR | Status: AC | PRN
  Administered 2020-06-26: 4 mg via INTRAVENOUS

## 2020-06-26 MED ORDER — FENTANYL CITRATE (PF) 100 MCG/2ML IJ SOLN
25.0000 ug | INTRAMUSCULAR | Status: DC | PRN
  Administered 2020-06-26: 25 ug via INTRAVENOUS

## 2020-06-26 MED ORDER — MIDAZOLAM HCL 2 MG/2ML IJ SOLN
INTRAMUSCULAR | Status: DC | PRN
  Administered 2020-06-26: 2 mg via INTRAVENOUS

## 2020-06-26 MED ORDER — ONABOTULINUMTOXINA 100 UNITS IJ SOLR
INTRAMUSCULAR | Status: DC | PRN
Start: 2020-06-26 — End: 2020-06-26
  Administered 2020-06-26: 80 [IU] via INTRAMUSCULAR

## 2020-06-26 MED ORDER — ONDANSETRON HCL 4 MG/2ML IJ SOLN
INTRAMUSCULAR | Status: DC | PRN
  Administered 2020-06-26: 4 mg via INTRAVENOUS

## 2020-06-26 MED ORDER — ORAL CARE MOUTH RINSE: 15.0000 mL | Freq: Once | OROMUCOSAL | Status: AC

## 2020-06-26 SURGICAL SUPPLY — 30 items
BLADE SURG 15 STRL LF DISP TIS (BLADE) ×1 IMPLANT
BLADE SURG 15 STRL SS (BLADE) ×2
BRIEF STRETCH FOR OB PAD XXL (UNDERPADS AND DIAPERS) ×2 IMPLANT
CANISTER SUCT 1200ML W/VALVE (MISCELLANEOUS) IMPLANT
COVER WAND RF STERILE (DRAPES) ×2 IMPLANT
DRAPE LAPAROTOMY 77X122 PED (DRAPES) ×2 IMPLANT
DRSG GAUZE FLUFF 36X18 (GAUZE/BANDAGES/DRESSINGS) ×2 IMPLANT
ELECT CAUTERY BLADE TIP 2.5 (TIP) ×2
ELECT REM PT RETURN 9FT ADLT (ELECTROSURGICAL) ×2
ELECTRODE CAUTERY BLDE TIP 2.5 (TIP) ×1 IMPLANT
ELECTRODE REM PT RTRN 9FT ADLT (ELECTROSURGICAL) ×1 IMPLANT
GLOVE SURG ENC MOIS LTX SZ6.5 (GLOVE) ×8 IMPLANT
GLOVE SURG UNDER LTX SZ7 (GLOVE) ×8 IMPLANT
GOWN STRL REUS W/ TWL LRG LVL3 (GOWN DISPOSABLE) ×2 IMPLANT
GOWN STRL REUS W/TWL LRG LVL3 (GOWN DISPOSABLE) ×4
KIT TURNOVER KIT A (KITS) ×2 IMPLANT
LABEL OR SOLS (LABEL) ×2 IMPLANT
MANIFOLD NEPTUNE II (INSTRUMENTS) ×2 IMPLANT
NEEDLE HYPO 25X1 1.5 SAFETY (NEEDLE) ×2 IMPLANT
PACK BASIN MINOR ARMC (MISCELLANEOUS) ×2 IMPLANT
SHEARS HARMONIC 9CM CVD (BLADE) IMPLANT
SOL PREP PVP 2OZ (MISCELLANEOUS) ×2
SOLUTION PREP PVP 2OZ (MISCELLANEOUS) ×1 IMPLANT
SPONGE LAP 18X18 RF (DISPOSABLE) ×2 IMPLANT
SURGILUBE 2OZ TUBE FLIPTOP (MISCELLANEOUS) ×2 IMPLANT
SUT SILK 0 CT 1 30 (SUTURE) ×2 IMPLANT
SUT VIC AB 3-0 SH 27 (SUTURE)
SUT VIC AB 3-0 SH 27X BRD (SUTURE) IMPLANT
SYR 10ML LL (SYRINGE) ×2 IMPLANT
SYR BULB IRRIG 60ML STRL (SYRINGE) ×2 IMPLANT

## 2020-06-26 NOTE — Discharge Instructions (Addendum)
Bupivacaine Liposomal Suspension for Injection     WEAR GREEN BRACELET FOR 3 DAYS  What is this medicine? BUPIVACAINE LIPOSOMAL (bue PIV a kane LIP oh som al) is an anesthetic. It causes loss of feeling in the skin or other tissues. It is used to prevent and to treat pain from some procedures. This medicine may be used for other purposes; ask your health care provider or pharmacist if you have questions. COMMON BRAND NAME(S): EXPAREL What should I tell my health care provider before I take this medicine? They need to know if you have any of these conditions:  G6PD deficiency  heart disease  kidney disease  liver disease  low blood pressure  lung or breathing disease, like asthma  an unusual or allergic reaction to bupivacaine, other medicines, foods, dyes, or preservatives  pregnant or trying to get pregnant  breast-feeding How should I use this medicine? This medicine is injected into the affected area. It is given by a health care provider in a hospital or clinic setting. Talk to your health care provider about the use of this medicine in children. While it may be given to children as young as 6 years for selected conditions, precautions do apply. Overdosage: If you think you have taken too much of this medicine contact a poison control center or emergency room at once. NOTE: This medicine is only for you. Do not share this medicine with others. What if I miss a dose? This does not apply. What may interact with this medicine? This medicine may interact with the following medications:  acetaminophen  certain antibiotics like dapsone, nitrofurantoin, aminosalicylic acid, sulfonamides  certain medicines for seizures like phenobarbital, phenytoin, valproic acid  chloroquine  cyclophosphamide  flutamide  hydroxyurea  ifosfamide  metoclopramide  nitric oxide  nitroglycerin  nitroprusside  nitrous oxide  other local anesthetics like lidocaine, pramoxine,  tetracaine  primaquine  quinine  rasburicase  sulfasalazine This list may not describe all possible interactions. Give your health care provider a list of all the medicines, herbs, non-prescription drugs, or dietary supplements you use. Also tell them if you smoke, drink alcohol, or use illegal drugs. Some items may interact with your medicine. What should I watch for while using this medicine? Your condition will be monitored carefully while you are receiving this medicine. Be careful to avoid injury while the area is numb, and you are not aware of pain. What side effects may I notice from receiving this medicine? Side effects that you should report to your doctor or health care professional as soon as possible:  allergic reactions like skin rash, itching or hives, swelling of the face, lips, or tongue  seizures  signs and symptoms of a dangerous change in heartbeat or heart rhythm like chest pain; dizziness; fast, irregular heartbeat; palpitations; feeling faint or lightheaded; falls; breathing problems  signs and symptoms of methemoglobinemia such as pale, gray, or blue colored skin; headache; fast heartbeat; shortness of breath; feeling faint or lightheaded, falls; tiredness Side effects that usually do not require medical attention (report to your doctor or health care professional if they continue or are bothersome):  anxious  back pain  changes in taste  changes in vision  constipation  dizziness  fever  nausea, vomiting This list may not describe all possible side effects. Call your doctor for medical advice about side effects. You may report side effects to FDA at 1-800-FDA-1088. Where should I keep my medicine? This drug is given in a hospital or clinic and  will not be stored at home. NOTE: This sheet is a summary. It may not cover all possible information. If you have questions about this medicine, talk to your doctor, pharmacist, or health care provider.  2021  Elsevier/Gold Standard (2019-06-24 12:24:57)   AMBULATORY SURGERY  DISCHARGE INSTRUCTIONS   1) The drugs that you were given will stay in your system until tomorrow so for the next 24 hours you should not:  A) Drive an automobile B) Make any legal decisions C) Drink any alcoholic beverage   2) You may resume regular meals tomorrow.  Today it is better to start with liquids and gradually work up to solid foods.  You may eat anything you prefer, but it is better to start with liquids, then soup and crackers, and gradually work up to solid foods.   3) Please notify your doctor immediately if you have any unusual bleeding, trouble breathing, redness and pain at the surgery site, drainage, fever, or pain not relieved by medication.    4) Additional Instructions:        Please contact your physician with any problems or Same Day Surgery at [phone], Monday through Friday 6 am to 4 pm, or [place] at [facility] number at [phone].

## 2020-06-26 NOTE — Transfer of Care (Signed)
Immediate Anesthesia Transfer of Care Note  Patient: Claudia Gutierrez  Procedure(s) Performed: BOTOX INJECTION into internal anal sphincter muscles (N/A ) HEMORRHOIDECTOMY (N/A )  Patient Location: PACU  Anesthesia Type:General  Level of Consciousness: awake, drowsy and patient cooperative  Airway & Oxygen Therapy: Patient Spontanous Breathing and Patient connected to face mask oxygen  Post-op Assessment: Report given to RN and Post -op Vital signs reviewed and stable  Post vital signs: Reviewed and stable  Last Vitals:  Vitals Value Taken Time  BP 153/82 06/26/20 1256  Temp    Pulse 86 06/26/20 1259  Resp 10 06/26/20 1259  SpO2 99 % 06/26/20 1259  Vitals shown include unvalidated device data.  Last Pain:  Vitals:   06/26/20 1001  PainSc: 2          Complications: No complications documented.

## 2020-06-26 NOTE — Anesthesia Postprocedure Evaluation (Signed)
Anesthesia Post Note  Patient: Claudia Gutierrez  Procedure(s) Performed: BOTOX INJECTION into internal anal sphincter muscles (N/A ) HEMORRHOIDECTOMY (N/A )  Patient location during evaluation: PACU Anesthesia Type: General Level of consciousness: awake and alert and oriented Pain management: pain level controlled Vital Signs Assessment: post-procedure vital signs reviewed and stable Respiratory status: spontaneous breathing Cardiovascular status: blood pressure returned to baseline Anesthetic complications: no   No complications documented.   Last Vitals:  Vitals:   06/26/20 1345 06/26/20 1354  BP: 136/74 (!) 147/71  Pulse: 72 74  Resp: 11 16  Temp: (!) 36.3 C (!) 36.2 C  SpO2: 94% 94%    Last Pain:  Vitals:   06/26/20 1354  TempSrc: Temporal  PainSc: 3                  Abbegale Stehle

## 2020-06-26 NOTE — Op Note (Signed)
Operative Note  Preoperative Diagnosis:   Anal fissure  Postoperative Diagnosis: Same, nearly healed  Operation: Examination under anesthesia, Botox injection into the internal anal sphincter muscle  Surgeon: Fredirick Maudlin, MD  Assistant: None  Anesthesia: General  Findings: The fissure that had been seen and diagnosed in clinic had almost completely healed.  The patient does have some small internal hemorrhoids that I do not think are contributing to her symptoms.  Indications: This is a 70 year old woman who was referred to our clinic for evaluation of hemorrhoids.  Physical examination did not reveal any external hemorrhoids, but did identify a posterior anal fissure.  She was prescribed a compounded nifedipine/lidocaine ointment.  At the end of 4 weeks of treatment, however, she stated that it was not working and she wanted to proceed to a more aggressive intervention.  Botox injection of the internal sphincter muscles was recommended.  The risks were discussed with the patient and she agreed to proceed.  Procedure In Detail: The patient was identified in the preoperative holding area and brought to the operating room.  She was intubated on her stretcher and then turned into a prone position on the OR table.  Bony prominences were padded.  Her body was supported on multiple pillows, and her arms were supported on the armrests of the table.  The table was flexed and the buttocks taped laterally to provide exposure.  She was prepped and draped in standard fashion.  A timeout was performed confirming her identity, the procedure being performed, her allergies, all necessary equipment was available, and that maintenance anesthesia was adequate.  The Hill-Ferguson anoscopic retractor was lubricated and inserted into the anus.  Circumferential evaluation of the anal canal was performed.  There were a couple of small internal hemorrhoids in the posterior column.  The sentinel pile that was adjacent  to the fissure was appreciated.  The fissure itself appeared to be nearly completely healed.  The internal anal sphincter was palpated.  A total of 80 units of Botox was injected into either side of the anal canal, using palpation to guide the needle into the muscle.  Liposomal bupivacaine was then infiltrated surrounding the fissure to provide pain relief while the Botox takes effect.  The patient was then turned supine onto her stretcher where she was awakened, extubated, and taken to the postanesthesia care unit in good condition.  EBL: None  IVF: Anesthesia record  Specimen(s): None  Complications: none immediately apparent.   Counts: all needles, instruments, and sponges were counted and reported to be correct in number at the end of the case.   I was present for and participated in the entire operation.  Fredirick Maudlin 12:51 PM

## 2020-06-26 NOTE — Anesthesia Procedure Notes (Signed)
Procedure Name: Intubation Date/Time: 06/26/2020 12:16 PM Performed by: Lily Peer, Ordean Fouts, CRNA Pre-anesthesia Checklist: Patient identified, Emergency Drugs available, Suction available and Patient being monitored Patient Re-evaluated:Patient Re-evaluated prior to induction Oxygen Delivery Method: Circle system utilized Preoxygenation: Pre-oxygenation with 100% oxygen Induction Type: IV induction Ventilation: Mask ventilation without difficulty Laryngoscope Size: McGraph and 3 Grade View: Grade I Tube type: Oral Tube size: 6.5 mm Number of attempts: 1 Airway Equipment and Method: Stylet and Oral airway Placement Confirmation: ETT inserted through vocal cords under direct vision,  positive ETCO2 and breath sounds checked- equal and bilateral Secured at: 19 cm Tube secured with: Tape Dental Injury: Teeth and Oropharynx as per pre-operative assessment

## 2020-06-26 NOTE — Interval H&P Note (Signed)
History and Physical Interval Note:  06/26/2020 11:36 AM  Claudia Gutierrez  has presented today for surgery, with the diagnosis of anal fissure.  The various methods of treatment have been discussed with the patient and family. After consideration of risks, benefits and other options for treatment, the patient has consented to  Procedure(s) with comments: BOTOX INJECTION into internal anal sphincter muscles (N/A) - Provider requesting 30 minutes for procedure HEMORRHOIDECTOMY (N/A) as a surgical intervention.  The patient's history has been reviewed, patient examined, no change in status, stable for surgery.  I have reviewed the patient's chart and labs.  Questions were answered to the patient's satisfaction.     Fredirick Maudlin

## 2020-06-26 NOTE — Anesthesia Preprocedure Evaluation (Addendum)
Anesthesia Evaluation  Patient identified by MRN, date of birth, ID band Patient awake    History of Anesthesia Complications (+) PONVNegative for: history of anesthetic complications  Airway Mallampati: III  TM Distance: >3 FB Neck ROM: Full    Dental  (+) Upper Dentures, Partial Lower   Pulmonary shortness of breath and with exertion, former smoker,    Pulmonary exam normal        Cardiovascular Normal cardiovascular exam     Neuro/Psych PSYCHIATRIC DISORDERS Anxiety Depression    GI/Hepatic Neg liver ROS, GERD  Controlled and Medicated,  Endo/Other  negative endocrine ROS  Renal/GU negative Renal ROS     Musculoskeletal negative musculoskeletal ROS (+)   Abdominal   Peds negative pediatric ROS (+)  Hematology negative hematology ROS (+)   Anesthesia Other Findings Past Medical History: No date: Acid reflux No date: Anxiety No date: Complication of anesthesia     Comment:  ponv No date: History of kidney stones No date: PONV (postoperative nausea and vomiting)     Comment:  2002 colonoscopy No date: Wears dentures     Comment:  Full upper  Reproductive/Obstetrics                            Anesthesia Physical  Anesthesia Plan  ASA: II  Anesthesia Plan: General   Post-op Pain Management:    Induction: Intravenous  PONV Risk Score and Plan: 2  Airway Management Planned: Oral ETT  Additional Equipment:   Intra-op Plan:   Post-operative Plan: Extubation in OR  Informed Consent: I have reviewed the patients History and Physical, chart, labs and discussed the procedure including the risks, benefits and alternatives for the proposed anesthesia with the patient or authorized representative who has indicated his/her understanding and acceptance.       Plan Discussed with: CRNA and Anesthesiologist  Anesthesia Plan Comments: (Patient cannot take her dentures out .Marland Kitchen  She  was advised that we will not be responsible if they break and that is a risk.)       Anesthesia Quick Evaluation

## 2020-06-27 ENCOUNTER — Encounter: Payer: Self-pay | Admitting: General Surgery

## 2020-06-29 ENCOUNTER — Ambulatory Visit: Payer: MEDICARE | Admitting: General Surgery

## 2020-06-29 DIAGNOSIS — Z1231 Encounter for screening mammogram for malignant neoplasm of breast: Secondary | ICD-10-CM | POA: Diagnosis not present

## 2020-07-03 ENCOUNTER — Telehealth: Payer: Self-pay

## 2020-07-03 NOTE — Telephone Encounter (Signed)
Lm to remind patient of covid test prior to PFT  07/05/2020 [facility]

## 2020-07-04 ENCOUNTER — Ambulatory Visit (INDEPENDENT_AMBULATORY_CARE_PROVIDER_SITE_OTHER): Payer: MEDICARE | Admitting: General Surgery

## 2020-07-04 ENCOUNTER — Encounter: Payer: Self-pay | Admitting: General Surgery

## 2020-07-04 ENCOUNTER — Other Ambulatory Visit: Payer: Self-pay

## 2020-07-04 VITALS — BP 141/65 | HR 74 | Temp 98.2°F | Ht 66.0 in | Wt 159.0 lb

## 2020-07-04 DIAGNOSIS — K602 Anal fissure, unspecified: Secondary | ICD-10-CM

## 2020-07-04 NOTE — Patient Instructions (Signed)
Follow up here as needed. Call with any questions. Be sure to keep your stools soft and easy to move. You may take a fiber supplement to help with this. Be sure to drink plenty water.

## 2020-07-04 NOTE — Progress Notes (Signed)
Claudia Gutierrez is here for a postoperative visit.  She is a 70 year old woman who had a posterior anal fissure.  She reported continued symptoms after roughly 4 weeks of topical nifedipine/lidocaine ointment and desired more aggressive intervention.  She underwent Botox injection of the internal sphincter on June 26, 2020.  She states that she still notices some pressure and discomfort with bowel movements, but this is improving significantly from preop.  She has noticed a little bit of drainage and a little bit of incontinence to flatus, but has not experienced any stool leakage.  Today's Vitals   07/04/20 0907  BP: (!) 141/65  Pulse: 74  Temp: 98.2 F (36.8 C)  SpO2: 99%  Weight: 159 lb (72.1 kg)  Height: 5\' 6"  (1.676 m)   Body mass index is 25.66 kg/m. Focused examination demonstrates that the fissure has healed completely.  She does have a small internal hemorrhoid without stigmata of recent bleeding. Impression and plan: This is a 70 year old woman who had an anal fissure treated initially with nifedipine/lidocaine ointment, but failed to improve adequately on this regimen.  She underwent Botox injection of the internal anal sphincter roughly a week ago and is improving.  I anticipate that in 1 to 2 weeks, her symptoms will have resolved.  I think the small amount of bleeding she is noticing on her bowel movements is probably from the internal hemorrhoid which could potentially be treated with banding in the future if she so desires.  I will see her on an as-needed basis.

## 2020-07-04 NOTE — Telephone Encounter (Signed)
Lm x2 for patient.  Will close encounter per office protocol.   

## 2020-07-05 ENCOUNTER — Inpatient Hospital Stay: Admission: RE | Admit: 2020-07-05 | Payer: Medicare HMO | Source: Ambulatory Visit

## 2020-07-06 ENCOUNTER — Ambulatory Visit: Payer: Medicare HMO | Attending: Pulmonary Disease

## 2020-07-06 ENCOUNTER — Other Ambulatory Visit: Payer: Self-pay

## 2020-07-06 DIAGNOSIS — H02831 Dermatochalasis of right upper eyelid: Secondary | ICD-10-CM | POA: Insufficient documentation

## 2020-07-06 DIAGNOSIS — H57813 Brow ptosis, bilateral: Secondary | ICD-10-CM | POA: Insufficient documentation

## 2020-07-06 DIAGNOSIS — H02834 Dermatochalasis of left upper eyelid: Secondary | ICD-10-CM | POA: Insufficient documentation

## 2020-07-06 DIAGNOSIS — R0602 Shortness of breath: Secondary | ICD-10-CM

## 2020-07-10 ENCOUNTER — Telehealth: Payer: Self-pay | Admitting: General Surgery

## 2020-07-10 NOTE — Telephone Encounter (Signed)
Disregard note.

## 2020-07-13 ENCOUNTER — Encounter: Payer: Self-pay | Admitting: General Surgery

## 2020-07-13 ENCOUNTER — Telehealth: Payer: Self-pay | Admitting: General Surgery

## 2020-07-13 ENCOUNTER — Encounter: Payer: Self-pay | Admitting: Physician Assistant

## 2020-07-13 ENCOUNTER — Ambulatory Visit (INDEPENDENT_AMBULATORY_CARE_PROVIDER_SITE_OTHER): Payer: MEDICARE | Admitting: Physician Assistant

## 2020-07-13 ENCOUNTER — Other Ambulatory Visit: Payer: Self-pay

## 2020-07-13 VITALS — BP 143/75 | HR 77 | Temp 98.6°F | Ht 66.0 in | Wt 160.6 lb

## 2020-07-13 DIAGNOSIS — Z09 Encounter for follow-up examination after completed treatment for conditions other than malignant neoplasm: Secondary | ICD-10-CM

## 2020-07-13 DIAGNOSIS — K602 Anal fissure, unspecified: Secondary | ICD-10-CM

## 2020-07-13 NOTE — Patient Instructions (Signed)
Please call with any questions or concerns. Continue to clean after each bowel movements. Take sitz baths a few times a day.  How to Take a [employer] A sitz bath is a warm water bath that may be used to care for your rectum, genital area, or the area between your rectum and genitals (perineum). In a sitz bath, the water only comes up to your hips and covers your buttocks. A sitz bath may be done in a bathtub or with a portable sitz bath that fits over the toilet. Your health care provider may recommend a sitz bath to help:  Relieve pain and discomfort after delivering a baby.  Relieve pain and itching from hemorrhoids or anal fissures.  Relieve pain after certain surgeries.  Relax muscles that are sore or tight. How to take a sitz bath Take 3-4 sitz baths a day, or as many as told by your health care provider. Bathtub sitz bath To take a sitz bath in a bathtub: 1. Partially fill a bathtub with warm water. The water should be deep enough to cover your hips and buttocks when you are sitting in the tub. 2. Follow your health care provider's instructions if you are told to put medicine in the water. 3. Sit in the water. Open the tub drain a little, and leave it open during your bath. 4. Turn on the warm water again, enough to replace the water that is draining out. Keep the water running throughout your bath. This helps keep the water at the right level and temperature. 5. Soak in the water for 15-20 minutes, or as long as told by your health care provider. 6. When you are done, be careful when you stand up. You may feel dizzy. 7. After the sitz bath, pat yourself dry. Do not rub your skin to dry it.   Over-the-toilet sitz bath To take a sitz bath with an over-the-toilet basin: 1. Follow the manufacturer's instructions. 2. Fill the basin with warm water. 3. Follow your health care provider's instructions if you were told to put medicine in the water. 4. Sit on the seat. Make sure the water  covers your buttocks and perineum. 5. Soak in the water for 15-20 minutes, or as long as told by your health care provider. 6. After the sitz bath, pat yourself dry. Do not rub your skin to dry it. 7. Clean and dry the basin between uses. 8. Discard the basin if it cracks, or according to the manufacturer's instructions.   Contact a health care provider if:  Your pain or itching gets worse. Do not continue with sitz baths if your symptoms get worse.  You have new symptoms. Do not continue with sitz baths until you talk with your health care provider. Summary  A sitz bath is a warm water bath in which the water only comes up to your hips and covers your buttocks.  A sitz bath may help relieve pain and discomfort after delivering a baby. It also may help with pain and itching from hemorrhoids or anal fissures, or pain after certain surgeries. It can also help to relax muscles that are sore or tight.  Take 3-4 sitz baths a day, or as many as told by your health care provider. Soak in the water for 15-20 minutes.  Do not continue with sitz baths if your symptoms get worse. This information is not intended to replace advice given to you by your health care provider. Make sure you discuss any questions you  have with your health care provider. Document Revised: 12/02/2019 Document Reviewed: 12/02/2019 Elsevier Patient Education  2021 [employer].

## 2020-07-13 NOTE — Progress Notes (Signed)
[facility] POST-OP OFFICE VISIT  07/13/2020  HPI: Claudia Gutierrez is a 70 y.o. female 17 days s/p examination under anesthesia and botox injection into the internal anal sphincter muscle for anal fissure with Dr Celine Ahr  She reports that she continues to have intermittent pain with bowel movements, mostly with wiping. She does not get constipated and reports she does not have to strain much.  She had noticed some blood on the tissue, but this resolved. No other drainage reports No fever, chills She is worried about adequate hygiene after bowel movements, but feels she is doing a good job No other complaints  Vital signs: BP (!) 143/75   Pulse 77   Temp 98.6 F (37 C) (Oral)   Ht 5\' 6"  (1.676 m)   Wt 160 lb 9.6 oz (72.8 kg)   SpO2 96%   BMI 25.92 kg/m    Physical Exam: Constitutional: Well appearing female, NAD GU: Chaperone present in room. External exam revealed well healed anal fissure posteriorly, no external hemorrhoids, no evidence of bleeding. Internal examination was unremarkable, no internal hemorrhoids palpable.   Assessment/Plan: This is a 70 y.o. female 17 days s/p examination under anesthesia and botox injection into the internal anal sphincter muscle for anal fissure   - No obvious rectal/anal pathology to explain discomfort. Her anal fissure has healed, no visible or palpable hemorrhoids. I encouraged to continue to avoid constipation. Add sitz baths as needed for hygiene and comfort. She will monitor her symptoms. If she fails to resolve, she was encouraged to contact our office.   -- Edison Simon, PA-C [facility] 07/13/2020, 11:41 AM [phone] M-F: 7am - 4pm

## 2020-07-13 NOTE — Telephone Encounter (Signed)
Patient called said she was seen this morning, patient said she is having difficulty holding her bladder and is asking what can she do or see if she needs to be referred out for this. Please call patient and advise.

## 2020-07-13 NOTE — Telephone Encounter (Signed)
Called and left a message for the patient to call back. Need to know if this is new since surgery or was a previous problem. We can send a referral to Urology she may also do Kegel exercises.

## 2020-07-17 ENCOUNTER — Other Ambulatory Visit: Payer: Self-pay

## 2020-07-17 DIAGNOSIS — R197 Diarrhea, unspecified: Secondary | ICD-10-CM

## 2020-07-18 ENCOUNTER — Encounter: Payer: Self-pay | Admitting: *Deleted

## 2020-07-26 ENCOUNTER — Encounter: Payer: Self-pay | Admitting: Internal Medicine

## 2020-07-26 ENCOUNTER — Ambulatory Visit (INDEPENDENT_AMBULATORY_CARE_PROVIDER_SITE_OTHER): Payer: Medicare HMO | Admitting: Internal Medicine

## 2020-07-26 ENCOUNTER — Other Ambulatory Visit: Payer: Self-pay

## 2020-07-26 VITALS — BP 122/68 | HR 63 | Temp 97.8°F | Ht 66.0 in | Wt 162.0 lb

## 2020-07-26 DIAGNOSIS — R062 Wheezing: Secondary | ICD-10-CM | POA: Diagnosis not present

## 2020-07-26 DIAGNOSIS — F325 Major depressive disorder, single episode, in full remission: Secondary | ICD-10-CM | POA: Diagnosis not present

## 2020-07-26 DIAGNOSIS — I700 Atherosclerosis of aorta: Secondary | ICD-10-CM

## 2020-07-26 DIAGNOSIS — R69 Illness, unspecified: Secondary | ICD-10-CM | POA: Diagnosis not present

## 2020-07-26 MED ORDER — FLUTICASONE FUROATE-VILANTEROL 100-25 MCG/INH IN AEPB: 1.0000 | INHALATION_SPRAY | Freq: Every day | RESPIRATORY_TRACT | 11 refills | Status: DC

## 2020-07-26 NOTE — Progress Notes (Addendum)
Date:  07/26/2020   Name:  Claudia Gutierrez   DOB:  October 27, 1950   MRN:  [id number]   Chief Complaint: Hyperlipidemia (Was told to come in and get it checked because she may need to be on a statin medication and needs to determine baseline ) and Depression  Depression        This is a chronic problem.  The problem has been resolved since onset.  Associated symptoms include no fatigue and no headaches.  Past treatments include SSRIs - Selective serotonin reuptake inhibitors (and trazodone).  Compliance with treatment is good.  Previous treatment provided significant relief.   Pertinent negatives include no hypothyroidism. Hyperlipidemia She has no history of chronic renal disease, diabetes or hypothyroidism. Pertinent negatives include no chest pain or shortness of breath. Current antihyperlipidemic treatment includes diet change. Risk factors: aortic atherosclerosis.    Lab Results  Component Value Date   CREATININE 0.80 06/23/2020   BUN 14 06/23/2020   NA 140 06/23/2020   K 4.0 06/23/2020   CL 106 06/23/2020   CO2 26 06/23/2020   No results found for: CHOL, HDL, LDLCALC, LDLDIRECT, TRIG, CHOLHDL Lab Results  Component Value Date   TSH 3.440 02/07/2020   No results found for: HGBA1C Lab Results  Component Value Date   WBC 4.0 06/23/2020   HGB 12.4 06/23/2020   HCT 38.8 06/23/2020   MCV 94.2 06/23/2020   PLT 171 06/23/2020   Lab Results  Component Value Date   ALT 20 02/07/2020   AST 28 02/07/2020   ALKPHOS 60 02/07/2020   BILITOT 0.4 02/07/2020     Review of Systems  Constitutional: Negative for chills, fatigue and fever.  HENT: Positive for postnasal drip and sore throat (intermittent). Negative for trouble swallowing.   Respiratory: Positive for wheezing. Negative for cough, chest tightness and shortness of breath.   Cardiovascular: Negative for chest pain, palpitations and leg swelling.  Gastrointestinal: Negative for abdominal distention, abdominal pain and anal  bleeding (resolved after Botox).  Allergic/Immunologic: Positive for environmental allergies.  Neurological: Negative for dizziness and headaches.  Psychiatric/Behavioral: Positive for depression. Negative for dysphoric mood and sleep disturbance. The patient is not nervous/anxious.     Patient Active Problem List   Diagnosis Date Noted  . Dermatochalasis of both upper eyelids 07/06/2020  . Ptosis of both eyebrows 07/06/2020  . Anal fissure 05/23/2020  . Aortic atherosclerosis ([place]) 04/16/2020  . Sacro-iliac pain 04/14/2020  . Palpitation 04/14/2020  . History of colon polyps 10/05/2019  . Pancreatic cyst 10/05/2019  . GERD (gastroesophageal reflux disease) 10/12/2013  . Major depressive disorder, single episode, unspecified 10/12/2013    Allergies  Allergen Reactions  . Azithromycin Other (See Comments)    SWELLING/EDEMA  . Paroxetine Hcl Nausea Only and Other (See Comments)    GI Upset  . Ciprofloxacin Itching    Phlebitis   . Sodium Clavulanate Diarrhea  . Codeine Nausea And Vomiting    Past Surgical History:  Procedure Laterality Date  . ACHILLES TENDON REPAIR  2008  . BIOPSY  10/21/2019   Procedure: BIOPSY;  Surgeon: Milus Banister, MD;  Location: [facility];  Service: Endoscopy;;  . BOTOX INJECTION N/A 06/26/2020   Procedure: BOTOX INJECTION into internal anal sphincter muscles;  Surgeon: Fredirick Maudlin, MD;  Location: [facility];  Service: General;  Laterality: N/A;  Provider requesting 30 minutes for procedure  . BREAST CYST ASPIRATION Right 2010  . CATARACT EXTRACTION W/PHACO Right 12/21/2019   Procedure: CATARACT EXTRACTION  PHACO AND INTRAOCULAR LENS PLACEMENT (IOC) RIGHT 4.15 00:31.2;  Surgeon: Birder Robson, MD;  Location: [facility];  Service: Ophthalmology;  Laterality: Right;  . CATARACT EXTRACTION W/PHACO Left 01/11/2020   Procedure: CATARACT EXTRACTION PHACO AND INTRAOCULAR LENS PLACEMENT (IOC) LEFT 4.78 00:26.5;  Surgeon: Birder Robson,  MD;  Location: [facility];  Service: Ophthalmology;  Laterality: Left;  . COLONOSCOPY WITH PROPOFOL N/A 09/13/2019   Procedure: COLONOSCOPY WITH PROPOFOL;  Surgeon: Jonathon Bellows, MD;  Location: [facility];  Service: Gastroenterology;  Laterality: N/A;  . ESOPHAGOGASTRODUODENOSCOPY (EGD) WITH PROPOFOL N/A 10/21/2019   Procedure: ESOPHAGOGASTRODUODENOSCOPY (EGD) WITH PROPOFOL;  Surgeon: Milus Banister, MD;  Location: [facility];  Service: Endoscopy;  Laterality: N/A;  . EUS N/A 10/21/2019   Procedure: UPPER ENDOSCOPIC ULTRASOUND (EUS) RADIAL;  Surgeon: Milus Banister, MD;  Location: [facility];  Service: Endoscopy;  Laterality: N/A;  . FINE NEEDLE ASPIRATION N/A 10/21/2019   Procedure: FINE NEEDLE ASPIRATION (FNA) LINEAR;  Surgeon: Milus Banister, MD;  Location: [facility];  Service: Endoscopy;  Laterality: N/A;  . HEMORRHOID SURGERY N/A 06/26/2020   Procedure: HEMORRHOIDECTOMY;  Surgeon: Fredirick Maudlin, MD;  Location: [facility];  Service: General;  Laterality: N/A;    Social History   Tobacco Use  . Smoking status: Former Smoker    Packs/day: 0.50    Years: 10.00    Pack years: 5.00    Types: Cigarettes    Quit date: 2002    Years since quitting: 20.3  . Smokeless tobacco: Never Used  Vaping Use  . Vaping Use: Never used  Substance Use Topics  . Alcohol use: Yes    Comment: socially  . Drug use: Never     Medication list has been reviewed and updated.  Current Meds  Medication Sig  . BIOTIN PO Take 1,000 mg by mouth daily.  . Cholecalciferol (VITAMIN D-3) 125 MCG (5000 UT) TABS Take 125 mcg by mouth daily.  . citalopram (CELEXA) 20 MG tablet Take 1 tablet by mouth once daily (Patient taking differently: Take 20 mg by mouth daily.)  . fluticasone furoate-vilanterol (BREO ELLIPTA) 100-25 MCG/INH AEPB Inhale 1 puff into the lungs daily.  . folic acid (FOLVITE) 409 MCG tablet Take 800 mcg by mouth daily.  Marland Kitchen ibuprofen (ADVIL) 800 MG tablet Take 1 tablet (800 mg  total) by mouth every 8 (eight) hours as needed for moderate pain.  . Lactobacillus (ACIDOPHILUS) CAPS capsule Take 1 capsule by mouth daily.  . Omega-3 Fatty Acids (FISH OIL PO) Take 1 capsule by mouth daily.  Marland Kitchen omeprazole (PRILOSEC) 20 MG capsule Take 1 capsule (20 mg total) by mouth daily.  . traZODone (DESYREL) 100 MG tablet Take 1.5-2 tablets (150-200 mg total) by mouth at bedtime as needed. for sleep (Patient taking differently: Take 100 mg by mouth at bedtime as needed for sleep.)    PHQ 2/9 Scores 07/26/2020 05/03/2020 04/14/2020 02/07/2020  PHQ - 2 Score 0 4 0 0  PHQ- 9 Score 1 11 3 4     GAD 7 : Generalized Anxiety Score 07/26/2020 05/03/2020 04/14/2020 02/07/2020  Nervous, Anxious, on Edge 0 0 0 0  Control/stop worrying 0 0 1 0  Worry too much - different things 0 0 1 0  Trouble relaxing 0 0 0 0  Restless 0 0 0 0  Easily annoyed or irritable 0 0 0 0  Afraid - awful might happen 0 0 0 0  Total GAD 7 Score 0 0 2 0  Anxiety Difficulty -  Not difficult at all - Not difficult at all    BP Readings from Last 3 Encounters:  07/26/20 122/68  07/13/20 (!) 143/75  07/04/20 (!) 141/65    Physical Exam Vitals and nursing note reviewed.  Constitutional:      General: She is not in acute distress.    Appearance: Normal appearance. She is well-developed.  HENT:     Head: Normocephalic and atraumatic.  Neck:     Vascular: No carotid bruit.  Cardiovascular:     Rate and Rhythm: Normal rate and regular rhythm.     Pulses: Normal pulses.     Heart sounds: No murmur heard.   Pulmonary:     Effort: Pulmonary effort is normal. No respiratory distress.     Breath sounds: No wheezing or rhonchi.  Musculoskeletal:     Cervical back: Normal range of motion.     Right lower leg: No edema.     Left lower leg: No edema.  Lymphadenopathy:     Cervical: No cervical adenopathy.  Skin:    General: Skin is warm and dry.     Findings: No rash.  Neurological:     General: No focal deficit  present.     Mental Status: She is alert and oriented to person, place, and time.  Psychiatric:        Mood and Affect: Mood normal.        Behavior: Behavior normal.     Wt Readings from Last 3 Encounters:  07/26/20 162 lb (73.5 kg)  07/13/20 160 lb 9.6 oz (72.8 kg)  07/04/20 159 lb (72.1 kg)    BP 122/68   Pulse 63   Temp 97.8 F (36.6 C) (Oral)   Ht 5\' 6"  (1.676 m)   Wt 162 lb (73.5 kg)   SpO2 96%   BMI 26.15 kg/m   Assessment and Plan: 1. Major depressive disorder with single episode, in full remission ([place]) Clinically stable on current regimen with good control of symptoms, No SI or HI. Will continue current therapy.  2. Aortic atherosclerosis ([facility]) Seen on CT scan No hx of TIA, chest pain/angina, circulatory issues Remote smoking hx - will get lipid and then advise (pt is not inclined to take a statin unless strongly advised) - Lipid panel  3. Wheezing Did not get PFTs due to need for covid testing She has sx most days - suspect allergic wheezing vs mild COPD Will try Breo daily - pt instructed in use - fluticasone furoate-vilanterol (BREO ELLIPTA) 100-25 MCG/INH AEPB; Inhale 1 puff into the lungs daily.  Dispense: 1 each; Refill: 11   Partially dictated using Editor, commissioning. Any errors are unintentional.  Halina Maidens, MD [facility]  07/26/2020

## 2020-07-27 ENCOUNTER — Encounter: Payer: Self-pay | Admitting: Internal Medicine

## 2020-07-27 ENCOUNTER — Other Ambulatory Visit: Payer: Self-pay | Admitting: Internal Medicine

## 2020-07-27 DIAGNOSIS — I700 Atherosclerosis of aorta: Secondary | ICD-10-CM

## 2020-07-27 LAB — LIPID PANEL
Chol/HDL Ratio: 3.7 ratio (ref 0.0–4.4)
Cholesterol, Total: 297 mg/dL — ABNORMAL HIGH (ref 100–199)
HDL: 81 mg/dL (ref >39)
LDL Chol Calc (NIH): 192 mg/dL — ABNORMAL HIGH (ref 0–99)
Triglycerides: 135 mg/dL (ref 0–149)
VLDL Cholesterol Cal: 24 mg/dL (ref 5–40)

## 2020-07-27 MED ORDER — ATORVASTATIN CALCIUM 10 MG PO TABS: 10.0000 mg | ORAL_TABLET | Freq: Every day | ORAL | 1 refills | Status: DC

## 2020-07-31 ENCOUNTER — Telehealth: Payer: Self-pay

## 2020-07-31 NOTE — Telephone Encounter (Signed)
Left message on machine to call back  

## 2020-07-31 NOTE — Telephone Encounter (Signed)
-----   Message from Timothy Lasso, RN sent at 10/26/2019 10:35 AM EDT ----- repeat MRI of the pancreas in 07/2020 for surveillance

## 2020-08-01 NOTE — Telephone Encounter (Signed)
Left message on machine to call back  

## 2020-08-02 NOTE — Telephone Encounter (Signed)
Left message on machine to call back   Unable to reach the pt letter has been mailed.

## 2020-08-03 ENCOUNTER — Other Ambulatory Visit: Payer: Self-pay

## 2020-08-03 DIAGNOSIS — K862 Cyst of pancreas: Secondary | ICD-10-CM

## 2020-08-03 DIAGNOSIS — R935 Abnormal findings on diagnostic imaging of other abdominal regions, including retroperitoneum: Secondary | ICD-10-CM

## 2020-08-07 ENCOUNTER — Telehealth: Payer: Self-pay | Admitting: Internal Medicine

## 2020-08-07 NOTE — Telephone Encounter (Signed)
Copied from [place] [phone]. Topic: Medicare AWV >> Aug 07, 2020  1:38 PM Cher Nakai R wrote: Reason for CRM:  Left message for patient to call back and schedule Medicare Annual Wellness Visit (AWV) in office.   If unable to come into the office for AWV,  please offer to do virtually or by telephone.  No hx of AWV eligible as of 05/30/2020 awvi   Please schedule at anytime with [facility].      40 Minutes appointment   Any questions, please call me at [phone]

## 2020-08-13 ENCOUNTER — Ambulatory Visit
Admission: RE | Admit: 2020-08-13 | Discharge: 2020-08-13 | Disposition: A | Discharge status: Home / Self Care | Payer: Medicare HMO | Source: Ambulatory Visit | Attending: Gastroenterology | Admitting: Gastroenterology

## 2020-08-13 ENCOUNTER — Other Ambulatory Visit: Payer: Self-pay

## 2020-08-13 DIAGNOSIS — K760 Fatty (change of) liver, not elsewhere classified: Secondary | ICD-10-CM | POA: Diagnosis not present

## 2020-08-13 DIAGNOSIS — K862 Cyst of pancreas: Secondary | ICD-10-CM | POA: Diagnosis not present

## 2020-08-13 MED ORDER — GADOBUTROL 1 MMOL/ML IV SOLN
7.5000 mL | Freq: Once | INTRAVENOUS | Status: AC | PRN
  Administered 2020-08-13: 7.5 mL via INTRAVENOUS

## 2020-08-14 DIAGNOSIS — K219 Gastro-esophageal reflux disease without esophagitis: Secondary | ICD-10-CM | POA: Diagnosis not present

## 2020-08-14 DIAGNOSIS — H02831 Dermatochalasis of right upper eyelid: Secondary | ICD-10-CM | POA: Diagnosis not present

## 2020-08-14 DIAGNOSIS — R0683 Snoring: Secondary | ICD-10-CM | POA: Diagnosis not present

## 2020-08-14 DIAGNOSIS — Z79899 Other long term (current) drug therapy: Secondary | ICD-10-CM | POA: Diagnosis not present

## 2020-08-14 DIAGNOSIS — Z885 Allergy status to narcotic agent status: Secondary | ICD-10-CM | POA: Diagnosis not present

## 2020-08-14 DIAGNOSIS — H02834 Dermatochalasis of left upper eyelid: Secondary | ICD-10-CM | POA: Diagnosis not present

## 2020-08-14 DIAGNOSIS — H57813 Brow ptosis, bilateral: Secondary | ICD-10-CM | POA: Diagnosis not present

## 2020-08-14 DIAGNOSIS — R69 Illness, unspecified: Secondary | ICD-10-CM | POA: Diagnosis not present

## 2020-08-14 DIAGNOSIS — Z87891 Personal history of nicotine dependence: Secondary | ICD-10-CM | POA: Diagnosis not present

## 2020-10-03 ENCOUNTER — Telehealth: Payer: Self-pay | Admitting: Internal Medicine

## 2020-10-03 NOTE — Telephone Encounter (Signed)
Copied from [place] [phone]. Topic: Medicare AWV >> Oct 03, 2020 10:57 AM Cher Nakai R wrote: Reason for CRM:  Left message for patient to call back and schedule Medicare Annual Wellness Visit (AWV) in office.   If unable to come into the office for AWV,  please offer to do virtually or by telephone.  No hx of AWV eligible for AWVI as of 05/30/2020 per [facility]  Please schedule at anytime with [facility] Advisor.      40 Minutes appointment   Any questions, please call me at [phone]

## 2020-10-09 ENCOUNTER — Ambulatory Visit: Payer: Self-pay

## 2020-10-09 DIAGNOSIS — M2022 Hallux rigidus, left foot: Secondary | ICD-10-CM | POA: Diagnosis not present

## 2020-10-09 DIAGNOSIS — M79672 Pain in left foot: Secondary | ICD-10-CM | POA: Diagnosis not present

## 2020-10-09 NOTE — Telephone Encounter (Signed)
Pt c/o chronic pain from right mastoid bone down the right side to the base of right neck. Pain is constant. Began 6 months ago. Describes a burning sensation down the neck. Pt stated it hurts to wear glasses due to where the glasses fit over the ear. Pt denied redness but did state the mastoid bone felt swollen. No injuries prior to onset of pain.   Pt also c/o nosebleeds every other day. Pt stated her nose bleeds after she blows her nose. Pt stated that the nose bleeds have been chronic as well.  Denies any arm weakness or headaches. Denies stiff neck. Denies rash or fever.  Care advice given and due to no available appointments  in the next 2-3 days, called [facility]. Alonza Smoker was able to add pt on 10/11/20 at 1100. Pt thankful for appointment. Advised pt if she worsens (facial, arms, legs pain) or if worsens in any way to call back. Pt verbalized understanding.          Reason for Disposition  Neck pain is a chronic symptom (recurrent or ongoing AND present > 4 weeks)  [1] Bleeding recurs 3 or more times in 24 hours AND [2] direct pressure applied correctly  Answer Assessment - Initial Assessment Questions 1. ONSET: "When did the pain begin?"      6 months ago 2. LOCATION: "Where does it hurt?"      Right occipital bone down right of neck and ends nase of neck 3. PATTERN "Does the pain come and go, or has it been constant since it started?"      constant 4. SEVERITY: "How bad is the pain?"  (Scale 1-10; or mild, moderate, severe)   - NO PAIN (0): no pain or only slight stiffness    - MILD (1-3): doesn't interfere with normal activities    - MODERATE (4-7): interferes with normal activities or awakens from sleep    - SEVERE (8-10):  excruciating pain, unable to do any normal activities      7/10 burning 5. RADIATION: "Does the pain go anywhere else, shoot into your arms?"     Behind ear down the neck 6. CORD SYMPTOMS: "Any weakness or numbness of the arms or legs?"     no 7.  CAUSE: "What do you think is causing the neck pain?"     No idea  8. NECK OVERUSE: "Any recent activities that involved turning or twisting the neck?"     no 9. OTHER SYMPTOMS: "Do you have any other symptoms?" (e.g., headache, fever, chest pain, difficulty breathing, neck swelling)     Feels swollen to touch  Answer Assessment - Initial Assessment Questions 1. AMOUNT OF BLEEDING: "How bad is the bleeding?" "How much blood was lost?" "Has the bleeding stopped?"   - MILD: needed a couple tissues   - MODERATE: needed many tissues   - SEVERE: large blood clots, soaked many tissues, lasted more than 30 minutes      *No Answer* 2. ONSET: "When did the nosebleed start?"      Since the bone and neck pain started on the right side 6 months 3. FREQUENCY: "How many nosebleeds have you had in the last 24 hours?"      Every time she blows her nose 4. RECURRENT SYMPTOMS: "Have there been other recent nosebleeds?" If Yes, ask: "How long did it take you to stop the bleeding?" "What worked best?"      yes 5. CAUSE: "What do you think caused this nosebleed?"  Thinks might be associated with the pain behind right ear and down right side of neck  6. LOCAL FACTORS: "Do you have any cold symptoms?", "Have you been rubbing or picking at your nose?"     no 7. SYSTEMIC FACTORS: "Do you have high blood pressure or any bleeding problems?"     no 8. BLOOD THINNERS: "Do you take any blood thinners?" (e.g., aspirin, clopidogrel / Plavix, coumadin, heparin). Notes: Other strong blood thinners include: Arixtra (fondaparinux), Eliquis (apixaban), Pradaxa (dabigatran), and Xarelto (rivaroxaban).     no 9. OTHER SYMPTOMS: "Do you have any other symptoms?" (e.g., lightheadedness)     Weakness at times 10. PREGNANCY: "Is there any chance you are pregnant?" "When was your last menstrual period?"       N/a  Protocols used: Neck Pain or Stiffness-A-AH, Nosebleed-A-AH

## 2020-10-11 ENCOUNTER — Other Ambulatory Visit: Payer: Self-pay

## 2020-10-11 ENCOUNTER — Encounter: Payer: Self-pay | Admitting: Internal Medicine

## 2020-10-11 ENCOUNTER — Ambulatory Visit (INDEPENDENT_AMBULATORY_CARE_PROVIDER_SITE_OTHER): Payer: Medicare HMO | Admitting: Internal Medicine

## 2020-10-11 VITALS — BP 138/84 | HR 72 | Temp 98.3°F | Ht 66.0 in | Wt 161.0 lb

## 2020-10-11 DIAGNOSIS — R040 Epistaxis: Secondary | ICD-10-CM | POA: Diagnosis not present

## 2020-10-11 DIAGNOSIS — J019 Acute sinusitis, unspecified: Secondary | ICD-10-CM | POA: Diagnosis not present

## 2020-10-11 MED ORDER — CEFDINIR 300 MG PO CAPS: 300.0000 mg | ORAL_CAPSULE | Freq: Two times a day (BID) | ORAL | 0 refills | Status: AC

## 2020-10-11 NOTE — Progress Notes (Signed)
Date:  10/11/2020   Name:  Claudia Gutierrez   DOB:  1951-03-14   MRN:  [id number]   Chief Complaint: Ear Pain (X 3+ months, Right side, Back of ear going down to neck, constant burning pain )  Otalgia  There is pain in the right ear. This is a new problem. The current episode started more than 1 month ago. The problem occurs constantly. The problem has been unchanged. There has been no fever. Pertinent negatives include no coughing, diarrhea, ear discharge, headaches or sore throat. She has tried nothing for the symptoms.  Sinus Problem This is a new problem. The current episode started 1 to 4 weeks ago. There has been no fever. Associated symptoms include congestion, ear pain and sinus pressure. Pertinent negatives include no chills, coughing, headaches, hoarse voice, shortness of breath or sore throat. (Nose bleeds)   Lab Results  Component Value Date   CREATININE 0.80 06/23/2020   BUN 14 06/23/2020   NA 140 06/23/2020   K 4.0 06/23/2020   CL 106 06/23/2020   CO2 26 06/23/2020   Lab Results  Component Value Date   CHOL 297 (H) 07/26/2020   HDL 81 07/26/2020   LDLCALC 192 (H) 07/26/2020   TRIG 135 07/26/2020   CHOLHDL 3.7 07/26/2020   Lab Results  Component Value Date   TSH 3.440 02/07/2020   No results found for: HGBA1C Lab Results  Component Value Date   WBC 4.0 06/23/2020   HGB 12.4 06/23/2020   HCT 38.8 06/23/2020   MCV 94.2 06/23/2020   PLT 171 06/23/2020   Lab Results  Component Value Date   ALT 20 02/07/2020   AST 28 02/07/2020   ALKPHOS 60 02/07/2020   BILITOT 0.4 02/07/2020     Review of Systems  Constitutional:  Negative for chills, fatigue and fever.  HENT:  Positive for congestion, ear pain and sinus pressure. Negative for ear discharge, hoarse voice and sore throat.   Respiratory:  Negative for cough, chest tightness and shortness of breath.   Cardiovascular:  Negative for chest pain.  Gastrointestinal:  Negative for diarrhea.  Musculoskeletal:   Negative for arthralgias (no jaw pain).  Neurological:  Negative for dizziness, syncope, weakness, numbness and headaches.       Burning pain behind right ear   Patient Active Problem List   Diagnosis Date Noted   Dermatochalasis of both upper eyelids 07/06/2020   Ptosis of both eyebrows 07/06/2020   Anal fissure 05/23/2020   Aortic atherosclerosis ([place]) 04/16/2020   Sacro-iliac pain 04/14/2020   Palpitation 04/14/2020   History of colon polyps 10/05/2019   Pancreatic cyst 10/05/2019   GERD (gastroesophageal reflux disease) 10/12/2013   Major depressive disorder, single episode, unspecified 10/12/2013    Allergies  Allergen Reactions   Azithromycin Other (See Comments)    SWELLING/EDEMA   Paroxetine Hcl Nausea Only and Other (See Comments)    GI Upset   Ciprofloxacin Itching    Phlebitis    Sodium Clavulanate Diarrhea   Codeine Nausea And Vomiting    Past Surgical History:  Procedure Laterality Date   ACHILLES TENDON REPAIR  2008   BIOPSY  10/21/2019   Procedure: BIOPSY;  Surgeon: Milus Banister, MD;  Location: [facility];  Service: Endoscopy;;   BOTOX INJECTION N/A 06/26/2020   Procedure: BOTOX INJECTION into internal anal sphincter muscles;  Surgeon: Fredirick Maudlin, MD;  Location: [facility];  Service: General;  Laterality: N/A;  Provider requesting 30 minutes for procedure  BREAST CYST ASPIRATION Right 2010   CATARACT EXTRACTION W/PHACO Right 12/21/2019   Procedure: CATARACT EXTRACTION PHACO AND INTRAOCULAR LENS PLACEMENT (IOC) RIGHT 4.15 00:31.2;  Surgeon: Birder Robson, MD;  Location: [place];  Service: Ophthalmology;  Laterality: Right;   CATARACT EXTRACTION W/PHACO Left 01/11/2020   Procedure: CATARACT EXTRACTION PHACO AND INTRAOCULAR LENS PLACEMENT (IOC) LEFT 4.78 00:26.5;  Surgeon: Birder Robson, MD;  Location: [place];  Service: Ophthalmology;  Laterality: Left;   COLONOSCOPY WITH PROPOFOL N/A 09/13/2019   Procedure: COLONOSCOPY  WITH PROPOFOL;  Surgeon: Jonathon Bellows, MD;  Location: [facility];  Service: Gastroenterology;  Laterality: N/A;   ESOPHAGOGASTRODUODENOSCOPY (EGD) WITH PROPOFOL N/A 10/21/2019   Procedure: ESOPHAGOGASTRODUODENOSCOPY (EGD) WITH PROPOFOL;  Surgeon: Milus Banister, MD;  Location: [facility];  Service: Endoscopy;  Laterality: N/A;   EUS N/A 10/21/2019   Procedure: UPPER ENDOSCOPIC ULTRASOUND (EUS) RADIAL;  Surgeon: Milus Banister, MD;  Location: [facility];  Service: Endoscopy;  Laterality: N/A;   FINE NEEDLE ASPIRATION N/A 10/21/2019   Procedure: FINE NEEDLE ASPIRATION (FNA) LINEAR;  Surgeon: Milus Banister, MD;  Location: [facility];  Service: Endoscopy;  Laterality: N/A;   HEMORRHOID SURGERY N/A 06/26/2020   Procedure: HEMORRHOIDECTOMY;  Surgeon: Fredirick Maudlin, MD;  Location: [facility];  Service: General;  Laterality: N/A;    Social History   Tobacco Use   Smoking status: Former    Packs/day: 0.50    Years: 10.00    Pack years: 5.00    Types: Cigarettes    Quit date: 2002    Years since quitting: 20.5   Smokeless tobacco: Never  Vaping Use   Vaping Use: Never used  Substance Use Topics   Alcohol use: Yes    Comment: socially   Drug use: Never     Medication list has been reviewed and updated.  Current Meds  Medication Sig   atorvastatin (LIPITOR) 10 MG tablet Take 1 tablet (10 mg total) by mouth daily.   BIOTIN PO Take 1,000 mg by mouth daily.   cefdinir (OMNICEF) 300 MG capsule Take 1 capsule (300 mg total) by mouth 2 (two) times daily for 10 days.   Cholecalciferol (VITAMIN D-3) 125 MCG (5000 UT) TABS Take 125 mcg by mouth daily.   citalopram (CELEXA) 20 MG tablet Take 1 tablet by mouth once daily (Patient taking differently: Take 20 mg by mouth daily.)   fluticasone furoate-vilanterol (BREO ELLIPTA) 100-25 MCG/INH AEPB Inhale 1 puff into the lungs daily. (Patient taking differently: Inhale 1 puff into the lungs as needed.)   folic acid (FOLVITE) 846 MCG tablet  Take 800 mcg by mouth daily.   ibuprofen (ADVIL) 800 MG tablet Take 1 tablet (800 mg total) by mouth every 8 (eight) hours as needed for moderate pain.   Lactobacillus (ACIDOPHILUS) CAPS capsule Take 1 capsule by mouth daily.   Omega-3 Fatty Acids (FISH OIL PO) Take 1 capsule by mouth daily.   omeprazole (PRILOSEC) 20 MG capsule Take 1 capsule (20 mg total) by mouth daily.   traZODone (DESYREL) 100 MG tablet Take 1.5-2 tablets (150-200 mg total) by mouth at bedtime as needed. for sleep (Patient taking differently: Take 100 mg by mouth at bedtime as needed for sleep.)    PHQ 2/9 Scores 10/11/2020 10/11/2020 07/26/2020 05/03/2020  PHQ - 2 Score 0 0 0 4  PHQ- 9 Score 5 0 1 11    GAD 7 : Generalized Anxiety Score 10/11/2020 07/26/2020 05/03/2020 04/14/2020  Nervous, Anxious, on Edge 1 0 0  0  Control/stop worrying 0 0 0 1  Worry too much - different things 1 0 0 1  Trouble relaxing 1 0 0 0  Restless 0 0 0 0  Easily annoyed or irritable 1 0 0 0  Afraid - awful might happen 0 0 0 0  Total GAD 7 Score 4 0 0 2  Anxiety Difficulty Not difficult at all - Not difficult at all -    BP Readings from Last 3 Encounters:  10/11/20 138/84  07/26/20 122/68  07/13/20 (!) 143/75    Physical Exam Constitutional:      Appearance: Normal appearance. She is well-developed.  HENT:     Head:     Jaw: No trismus, tenderness or malocclusion.     Right Ear: Ear canal normal. There is mastoid tenderness (mild). Tympanic membrane is not erythematous or retracted.     Left Ear: Ear canal and external ear normal. No mastoid tenderness. Tympanic membrane is not erythematous or retracted.     Nose:     Right Sinus: Maxillary sinus tenderness present. No frontal sinus tenderness.     Left Sinus: Maxillary sinus tenderness present. No frontal sinus tenderness.     Mouth/Throat:     Mouth: No oral lesions.     Pharynx: Uvula midline. Posterior oropharyngeal erythema present. No oropharyngeal exudate.  Cardiovascular:      Rate and Rhythm: Normal rate and regular rhythm.     Pulses: Normal pulses.     Heart sounds: Normal heart sounds.  Pulmonary:     Breath sounds: Normal breath sounds. No wheezing or rales.  Musculoskeletal:     Cervical back: Normal range of motion.  Lymphadenopathy:     Cervical: No cervical adenopathy.  Neurological:     Mental Status: She is alert and oriented to person, place, and time.    Wt Readings from Last 3 Encounters:  10/11/20 161 lb (73 kg)  07/26/20 162 lb (73.5 kg)  07/13/20 160 lb 9.6 oz (72.8 kg)    BP 138/84 (BP Location: Right Arm, Patient Position: Sitting, Cuff Size: Normal)   Pulse 72   Temp 98.3 F (36.8 C) (Oral)   Ht 5\' 6"  (1.676 m)   Wt 161 lb (73 kg)   SpO2 95%   BMI 25.99 kg/m   Assessment and Plan: 1. Subacute sinusitis, unspecified location And possible mastoiditis causing burning pain which is mild and tolerable but persistent Skin is normal with no hx of rash  If sx persist after antibiotics, will refer to ENT - cefdinir (OMNICEF) 300 MG capsule; Take 1 capsule (300 mg total) by mouth 2 (two) times daily for 10 days.  Dispense: 20 capsule; Refill: 0  2. Epistaxis Avoid nasal sprays and dry air ENT evaluation if recurrent   Partially dictated using Dragon software. Any errors are unintentional.  Halina Maidens, MD [facility]  10/11/2020

## 2020-10-13 ENCOUNTER — Other Ambulatory Visit: Payer: Self-pay | Admitting: Internal Medicine

## 2020-10-13 MED ORDER — CITALOPRAM HYDROBROMIDE 20 MG PO TABS: 20.0000 mg | ORAL_TABLET | Freq: Every day | ORAL | 0 refills | Status: DC

## 2020-10-13 NOTE — Telephone Encounter (Signed)
Copied from [place] [phone]. Topic: Quick Communication - Rx Refill/Question >> Oct 13, 2020 11:11 AM Yvette Rack wrote: Medication: citalopram (CELEXA) 20 MG tablet  Has the patient contacted their pharmacy? Yes. Pt stated there is a problem with the pharmacy phone lines (Agent: If no, request that the patient contact the pharmacy for the refill.) (Agent: If yes, when and what did the pharmacy advise?)  Preferred Pharmacy (with phone number or street name): [place], [place] - [address] Phone: [phone]   Fax: [phone]  Agent: Please be advised that RX refills may take up to 3 business days. We ask that you follow-up with your pharmacy.

## 2020-10-14 ENCOUNTER — Other Ambulatory Visit: Payer: Self-pay | Admitting: Internal Medicine

## 2020-10-14 NOTE — Telephone Encounter (Signed)
Refilled 10/13/20

## 2020-10-16 ENCOUNTER — Encounter: Payer: Self-pay | Admitting: Podiatry

## 2020-10-16 ENCOUNTER — Other Ambulatory Visit: Payer: Self-pay

## 2020-10-17 ENCOUNTER — Other Ambulatory Visit: Payer: Self-pay | Admitting: Podiatry

## 2020-10-17 DIAGNOSIS — M79672 Pain in left foot: Secondary | ICD-10-CM | POA: Diagnosis not present

## 2020-10-17 DIAGNOSIS — M2022 Hallux rigidus, left foot: Secondary | ICD-10-CM | POA: Diagnosis not present

## 2020-10-17 NOTE — Discharge Instructions (Addendum)
Information for Discharge Teaching: EXPAREL (bupivacaine liposome injectable suspension)   Your surgeon or anesthesiologist gave you EXPAREL(bupivacaine) to help control your pain after surgery.  EXPAREL is a local anesthetic that provides pain relief by numbing the tissue around the surgical site. EXPAREL is designed to release pain medication over time and can control pain for up to 72 hours. Depending on how you respond to EXPAREL, you may require less pain medication during your recovery.  Possible side effects: Temporary loss of sensation or ability to move in the area where bupivacaine was injected. Nausea, vomiting, constipation Rarely, numbness and tingling in your mouth or lips, lightheadedness, or anxiety may occur. Call your doctor right away if you think you may be experiencing any of these sensations, or if you have other questions regarding possible side effects.  Follow all other discharge instructions given to you by your surgeon or nurse. Eat a healthy diet and drink plenty of water or other fluids.  If you return to the hospital for any reason within 96 hours following the administration of EXPAREL, it is important for health care providers to know that you have received this anesthetic. A teal colored band has been placed on your arm with the date, time and amount of EXPAREL you have received in order to alert and inform your health care providers. Please leave this armband in place for the full 96 hours following administration, and then you may remove the band.   [place]  POST OPERATIVE INSTRUCTIONS FOR DR. Vickki Muff AND DR. Thayer   Take your medication as prescribed.  Pain medication should be taken only as needed.  Keep the dressing clean, dry and intact.  Keep your foot elevated above the heart level for the first 48 hours.  Walking to the bathroom and brief periods of walking are  acceptable, unless we have instructed you to be non-weight bearing.  Always wear your post-op shoe when walking.  Always use your crutches if you are to be non-weight bearing.  Do not take a shower. Baths are permissible as long as the foot is kept out of the water.   Every hour you are awake:  Bend your knee 15 times. Flex foot 15 times Massage calf 15 times  Call [facility] [phone]) if any of the following problems occur: You develop a temperature or fever. The bandage becomes saturated with blood. Medication does not stop your pain. Injury of the foot occurs. Any symptoms of infection including redness, odor, or red streaks running from wound.

## 2020-10-18 ENCOUNTER — Ambulatory Visit: Payer: Medicare HMO | Admitting: Anesthesiology

## 2020-10-18 ENCOUNTER — Other Ambulatory Visit: Payer: Self-pay

## 2020-10-18 ENCOUNTER — Encounter
Admission: RE | Disposition: A | Discharge status: Home / Self Care | Payer: Self-pay | Source: Home / Self Care | Attending: Podiatry

## 2020-10-18 ENCOUNTER — Ambulatory Visit
Admission: RE | Admit: 2020-10-18 | Discharge: 2020-10-18 | Disposition: A | Discharge status: Home / Self Care | Payer: Medicare HMO | Attending: Podiatry | Admitting: Podiatry

## 2020-10-18 DIAGNOSIS — Z888 Allergy status to other drugs, medicaments and biological substances status: Secondary | ICD-10-CM | POA: Diagnosis not present

## 2020-10-18 DIAGNOSIS — Z7951 Long term (current) use of inhaled steroids: Secondary | ICD-10-CM | POA: Diagnosis not present

## 2020-10-18 DIAGNOSIS — Z87891 Personal history of nicotine dependence: Secondary | ICD-10-CM | POA: Diagnosis not present

## 2020-10-18 DIAGNOSIS — Z79899 Other long term (current) drug therapy: Secondary | ICD-10-CM | POA: Insufficient documentation

## 2020-10-18 DIAGNOSIS — Z885 Allergy status to narcotic agent status: Secondary | ICD-10-CM | POA: Insufficient documentation

## 2020-10-18 DIAGNOSIS — Z881 Allergy status to other antibiotic agents status: Secondary | ICD-10-CM | POA: Diagnosis not present

## 2020-10-18 DIAGNOSIS — M2022 Hallux rigidus, left foot: Secondary | ICD-10-CM | POA: Diagnosis not present

## 2020-10-18 HISTORY — PX: ARTHRODESIS METATARSALPHALANGEAL JOINT (MTPJ): SHX6566

## 2020-10-18 SURGERY — FUSION, JOINT, GREAT TOE
Anesthesia: General | Site: Toe | Laterality: Left

## 2020-10-18 MED ORDER — MIDAZOLAM HCL 5 MG/5ML IJ SOLN
INTRAMUSCULAR | Status: DC | PRN
  Administered 2020-10-18: 1 mg via INTRAVENOUS

## 2020-10-18 MED ORDER — ONDANSETRON HCL 4 MG/2ML IJ SOLN
INTRAMUSCULAR | Status: DC | PRN
  Administered 2020-10-18: 4 mg via INTRAVENOUS

## 2020-10-18 MED ORDER — SODIUM CHLORIDE 0.9 % IR SOLN
Status: DC | PRN
  Administered 2020-10-18: 500 mL

## 2020-10-18 MED ORDER — OXYCODONE HCL 5 MG/5ML PO SOLN
5.0000 mg | Freq: Once | ORAL | Status: DC | PRN
Start: 2020-10-18 — End: 2020-10-19

## 2020-10-18 MED ORDER — BUPIVACAINE LIPOSOME 1.3 % IJ SUSP
INTRAMUSCULAR | Status: DC | PRN
  Administered 2020-10-18 (×2): 10 mL

## 2020-10-18 MED ORDER — METOCLOPRAMIDE HCL 5 MG PO TABS: 5.0000 mg | ORAL_TABLET | Freq: Three times a day (TID) | ORAL | Status: DC | PRN

## 2020-10-18 MED ORDER — ONDANSETRON HCL 4 MG PO TABS: 4.0000 mg | ORAL_TABLET | Freq: Every day | ORAL | 1 refills | Status: DC | PRN

## 2020-10-18 MED ORDER — BUPIVACAINE HCL (PF) 0.25 % IJ SOLN
INTRAMUSCULAR | Status: DC | PRN
  Administered 2020-10-18: 10 mL

## 2020-10-18 MED ORDER — GLYCOPYRROLATE 0.2 MG/ML IJ SOLN
INTRAMUSCULAR | Status: DC | PRN
  Administered 2020-10-18: .1 mg via INTRAVENOUS

## 2020-10-18 MED ORDER — ONDANSETRON HCL 4 MG PO TABS: 4.0000 mg | ORAL_TABLET | Freq: Four times a day (QID) | ORAL | Status: DC | PRN

## 2020-10-18 MED ORDER — METOCLOPRAMIDE HCL 5 MG/ML IJ SOLN: 5.0000 mg | Freq: Three times a day (TID) | INTRAMUSCULAR | Status: DC | PRN

## 2020-10-18 MED ORDER — LACTATED RINGERS IV SOLN: INTRAVENOUS | Status: DC | PRN

## 2020-10-18 MED ORDER — CEFAZOLIN SODIUM-DEXTROSE 2-4 GM/100ML-% IV SOLN
2.0000 g | INTRAVENOUS | Status: AC
  Administered 2020-10-18: 2 g via INTRAVENOUS

## 2020-10-18 MED ORDER — OXYCODONE HCL 5 MG PO TABS: 5.0000 mg | ORAL_TABLET | Freq: Once | ORAL | Status: DC | PRN

## 2020-10-18 MED ORDER — ONDANSETRON HCL 4 MG/2ML IJ SOLN: 4.0000 mg | Freq: Four times a day (QID) | INTRAMUSCULAR | Status: DC | PRN

## 2020-10-18 MED ORDER — FENTANYL CITRATE (PF) 100 MCG/2ML IJ SOLN
INTRAMUSCULAR | Status: DC | PRN
  Administered 2020-10-18: 50 ug via INTRAVENOUS

## 2020-10-18 MED ORDER — PROPOFOL 10 MG/ML IV BOLUS
INTRAVENOUS | Status: DC | PRN
  Administered 2020-10-18: 120 mg via INTRAVENOUS

## 2020-10-18 MED ORDER — OXYCODONE-ACETAMINOPHEN 5-325 MG PO TABS: 1.0000 | ORAL_TABLET | Freq: Four times a day (QID) | ORAL | 0 refills | Status: DC | PRN

## 2020-10-18 MED ORDER — POVIDONE-IODINE 7.5 % EX SOLN: Freq: Once | CUTANEOUS | Status: DC

## 2020-10-18 MED ORDER — DEXAMETHASONE SODIUM PHOSPHATE 4 MG/ML IJ SOLN
INTRAMUSCULAR | Status: DC | PRN
  Administered 2020-10-18: 4 mg via INTRAVENOUS

## 2020-10-18 MED ORDER — FENTANYL CITRATE (PF) 100 MCG/2ML IJ SOLN: 25.0000 ug | INTRAMUSCULAR | Status: DC | PRN

## 2020-10-18 MED ORDER — ONDANSETRON HCL 4 MG/2ML IJ SOLN
4.0000 mg | Freq: Once | INTRAMUSCULAR | Status: AC | PRN
  Administered 2020-10-18: 4 mg via INTRAVENOUS

## 2020-10-18 SURGICAL SUPPLY — 46 items
APL SKNCLS STERI-STRIP NONHPOA (GAUZE/BANDAGES/DRESSINGS) ×1
BENZOIN TINCTURE PRP APPL 2/3 (GAUZE/BANDAGES/DRESSINGS) ×2 IMPLANT
BIT DRILL STRM CALB 2.0ST W/SL (BIT) ×1 IMPLANT
BNDG CMPR 75X41 PLY HI ABS (GAUZE/BANDAGES/DRESSINGS) ×1
BNDG COHESIVE 4X5 TAN STRL (GAUZE/BANDAGES/DRESSINGS) ×2 IMPLANT
BNDG ELASTIC 4X5.8 VLCR STR LF (GAUZE/BANDAGES/DRESSINGS) ×2 IMPLANT
BNDG ESMARK 4X12 TAN STRL LF (GAUZE/BANDAGES/DRESSINGS) ×2 IMPLANT
BNDG STRETCH 4X75 STRL LF (GAUZE/BANDAGES/DRESSINGS) ×2 IMPLANT
CANISTER SUCT 1200ML W/VALVE (MISCELLANEOUS) ×2 IMPLANT
COVER LIGHT HANDLE UNIVERSAL (MISCELLANEOUS) ×4 IMPLANT
CUFF TOURN SGL QUICK 18X4 (TOURNIQUET CUFF) ×2 IMPLANT
DRAPE FLUOR MINI C-ARM 54X84 (DRAPES) ×2 IMPLANT
DRILL STRM CALIB 2.0 ST W/SL (BIT) ×2
DURAPREP 26ML APPLICATOR (WOUND CARE) ×2 IMPLANT
ELECT REM PT RETURN 9FT ADLT (ELECTROSURGICAL) ×2
ELECTRODE REM PT RTRN 9FT ADLT (ELECTROSURGICAL) ×1 IMPLANT
GAUZE SPONGE 4X4 12PLY STRL (GAUZE/BANDAGES/DRESSINGS) ×2 IMPLANT
GAUZE XEROFORM 1X8 LF (GAUZE/BANDAGES/DRESSINGS) ×2 IMPLANT
GLOVE SRG 8 PF TXTR STRL LF DI (GLOVE) ×1 IMPLANT
GLOVE SURG ENC MOIS LTX SZ7.5 (GLOVE) ×2 IMPLANT
GLOVE SURG UNDER POLY LF SZ8 (GLOVE) ×2
GOWN STRL REUS W/ TWL LRG LVL3 (GOWN DISPOSABLE) ×2 IMPLANT
GOWN STRL REUS W/TWL LRG LVL3 (GOWN DISPOSABLE) ×4
K-WIRE ACE 1.6X6 (WIRE) ×6
KIT INSTRUMENT MPJ STRATUM (KITS) ×2 IMPLANT
KIT TURNOVER KIT A (KITS) ×2 IMPLANT
KWIRE ACE 1.6X6 (WIRE) ×3 IMPLANT
NS IRRIG 500ML POUR BTL (IV SOLUTION) ×2 IMPLANT
PACK EXTREMITY ARMC (MISCELLANEOUS) ×2 IMPLANT
PENCIL SMOKE EVACUATOR (MISCELLANEOUS) ×2 IMPLANT
PLATE MPJ 1ST STRM SM 0D LT (Plate) ×2 IMPLANT
SCREW LOCK ST STRM 2.7X10 (Screw) ×2 IMPLANT
SCREW LOCK ST STRM 2.7X14 (Screw) ×2 IMPLANT
SCREW LOCK ST STRM 2.7X16 (Screw) ×4 IMPLANT
SCREW NLOCK LP ST STRM 2.7X14 (Screw) ×2 IMPLANT
SCREW STRM LOCK 2.7X12 ST (Screw) ×2 IMPLANT
SCREW STRM NL LP 2.7X12 ST (Screw) ×2 IMPLANT
STOCKINETTE IMPERVIOUS LG (DRAPES) ×2 IMPLANT
STRIP CLOSURE SKIN 1/4X4 (GAUZE/BANDAGES/DRESSINGS) ×2 IMPLANT
SUT MNCRL+ 5-0 UNDYED PC-3 (SUTURE) ×2 IMPLANT
SUT MONOCRYL 5-0 (SUTURE) ×2
SUT VIC AB 3-0 SH 27 (SUTURE) ×2
SUT VIC AB 3-0 SH 27X BRD (SUTURE) ×1 IMPLANT
SUT VIC AB 4-0 FS2 27 (SUTURE) ×2 IMPLANT
SUT VIC AB 4-0 SH 27 (SUTURE) ×2
SUT VIC AB 4-0 SH 27XANBCTRL (SUTURE) ×1 IMPLANT

## 2020-10-18 NOTE — Anesthesia Procedure Notes (Signed)
Procedure Name: LMA Insertion Date/Time: 10/18/2020 2:29 PM Performed by: Mayme Genta, CRNA Pre-anesthesia Checklist: Patient identified, Emergency Drugs available, Suction available, Timeout performed and Patient being monitored Patient Re-evaluated:Patient Re-evaluated prior to induction Oxygen Delivery Method: Circle system utilized Preoxygenation: Pre-oxygenation with 100% oxygen Induction Type: IV induction LMA: LMA inserted LMA Size: 3.0 Number of attempts: 1 Placement Confirmation: positive ETCO2 and breath sounds checked- equal and bilateral Tube secured with: Tape

## 2020-10-18 NOTE — Transfer of Care (Signed)
Immediate Anesthesia Transfer of Care Note  Patient: Claudia Gutierrez  Procedure(s) Performed: ARTHRODESIS METATARSALPHALANGEAL JOINT (MTPJ) (Left: Toe)  Patient Location: PACU  Anesthesia Type: General  Level of Consciousness: awake, alert  and patient cooperative  Airway and Oxygen Therapy: Patient Spontanous Breathing and Patient connected to supplemental oxygen  Post-op Assessment: Post-op Vital signs reviewed, Patient's Cardiovascular Status Stable, Respiratory Function Stable, Patent Airway and No signs of Nausea or vomiting  Post-op Vital Signs: Reviewed and stable  Complications: No notable events documented.

## 2020-10-18 NOTE — H&P (Signed)
HISTORY AND PHYSICAL INTERVAL NOTE:  10/18/2020  2:04 PM  Claudia Gutierrez  has presented today for surgery, with the diagnosis of M20.22- Hallux rigidus of left foot.  The various methods of treatment have been discussed with the patient.  No guarantees were given.  After consideration of risks, benefits and other options for treatment, the patient has consented to surgery.  I have reviewed the patients' chart and labs.     A history and physical examination was performed in my office.  The patient was reexamined.  There have been no changes to this history and physical examination.  Samara Deist A

## 2020-10-18 NOTE — Op Note (Signed)
Operative note   Surgeon:Novie Maggio Lawyer: None    Preop diagnosis: Left first MTPJ hallux rigidus    Postop diagnosis: Same    Procedure: Arthrodesis left first MTPJ    EBL: Minimal    Anesthesia:local and general.  Local consisted of a total of 10 cc of 0.25% bupivacaine and 20 cc of Exparel long-acting anesthetic    Hemostasis: Mid calf tourniquet inflated to 200 mmHg for approximately 75 minutes    Specimen: None    Complications: None    Operative indications:Claudia Gutierrez is an 70 y.o. that presents today for surgical intervention.  The risks/benefits/alternatives/complications have been discussed and consent has been given.    Procedure:  Patient was brought into the OR and placed on the operating table in thesupine position. After anesthesia was obtained theleft lower extremity was prepped and draped in usual sterile fashion.  Attention was directed to the dorsomedial first MTPJ where longitudinal incision was performed.  Sharp and blunt dissection carried down to the capsule.  A longitudinal capsulotomy was then performed.  The joint was then exposed.  There was noted to be a large amount of para-articular spurring and complete loss of cartilage on the metatarsal head with minimal cartilage on the base of the proximal phalanx.  At this point all articular cartilage was then removed.  Next using a cup and cone reamer I was able to remove down to the subchondral bone plate.  The wound was flushed with copious amounts of irrigation.  The joint was then prepared with a 2.0 mm drill bit.  It was stabilized with a K wire.  A small left compression locking plate from the [employer] set was then used to the dorsal first MTPJ.  Locking screws were applied distal and proximal with 1 nonlocking screw at the most proximal aspect.  Compression was noted and stability noted.  Good alignment was noted in all planes.  Wounds were flushed with copious amounts of irrigation.  Layered  closure was performed with a 3-0 Vicryl the deeper tissue, 4-0 Vicryl for the subcutaneous tissue and a 5-0 Monocryl undyed.  A bulky sterile dressing was applied and the patient was then placed in a equalizer walker boot for nonweightbearing.    Patient tolerated the procedure and anesthesia well.  Was transported from the OR to the PACU with all vital signs stable and vascular status intact. To be discharged per routine protocol.  Will follow up in approximately 1 week in the outpatient clinic.

## 2020-10-18 NOTE — Anesthesia Postprocedure Evaluation (Signed)
Anesthesia Post Note  Patient: Claudia Gutierrez  Procedure(s) Performed: ARTHRODESIS METATARSALPHALANGEAL JOINT (MTPJ) (Left: Toe)     Patient location during evaluation: PACU Anesthesia Type: General Level of consciousness: awake and alert Pain management: pain level controlled Vital Signs Assessment: post-procedure vital signs reviewed and stable Respiratory status: spontaneous breathing, nonlabored ventilation, respiratory function stable and patient connected to nasal cannula oxygen Cardiovascular status: blood pressure returned to baseline and stable Postop Assessment: no apparent nausea or vomiting Anesthetic complications: no   No notable events documented.  Adele Barthel Bryannah Boston

## 2020-10-18 NOTE — Anesthesia Preprocedure Evaluation (Signed)
Anesthesia Evaluation  Patient identified by MRN, date of birth, ID band Patient awake    History of Anesthesia Complications Negative for: history of anesthetic complications  Airway Mallampati: II  TM Distance: >3 FB Neck ROM: Full    Dental  (+) Upper Dentures   Pulmonary neg pulmonary ROS, former smoker,    Pulmonary exam normal        Cardiovascular Exercise Tolerance: Good negative cardio ROS Normal cardiovascular exam     Neuro/Psych negative neurological ROS     GI/Hepatic Neg liver ROS, GERD  ,  Endo/Other  negative endocrine ROS  Renal/GU negative Renal ROS     Musculoskeletal   Abdominal   Peds  Hematology negative hematology ROS (+)   Anesthesia Other Findings   Reproductive/Obstetrics                             Anesthesia Physical Anesthesia Plan  ASA: 2  Anesthesia Plan: General   Post-op Pain Management:    Induction: Intravenous  PONV Risk Score and Plan: 3 and Midazolam, Treatment may vary due to age or medical condition, Ondansetron and Dexamethasone  Airway Management Planned: LMA  Additional Equipment: None  Intra-op Plan:   Post-operative Plan: Extubation in OR  Informed Consent: I have reviewed the patients History and Physical, chart, labs and discussed the procedure including the risks, benefits and alternatives for the proposed anesthesia with the patient or authorized representative who has indicated his/her understanding and acceptance.       Plan Discussed with: CRNA  Anesthesia Plan Comments:         Anesthesia Quick Evaluation

## 2020-10-19 ENCOUNTER — Other Ambulatory Visit: Payer: Self-pay | Admitting: Internal Medicine

## 2020-10-19 ENCOUNTER — Encounter: Payer: Self-pay | Admitting: Podiatry

## 2020-10-25 DIAGNOSIS — M2022 Hallux rigidus, left foot: Secondary | ICD-10-CM | POA: Diagnosis not present

## 2020-11-08 DIAGNOSIS — M2022 Hallux rigidus, left foot: Secondary | ICD-10-CM | POA: Diagnosis not present

## 2020-11-10 ENCOUNTER — Ambulatory Visit: Payer: Self-pay | Admitting: *Deleted

## 2020-11-10 ENCOUNTER — Telehealth: Payer: Self-pay

## 2020-11-10 NOTE — Telephone Encounter (Signed)
Pt called in c/o Covid symptoms that started on 11/06/2020.   Her boyfriend that lives with her is also having the same symptoms.  No high risk factors.   I went over the care advice for suspected Covid because she has not been tested or done a home test for Covid.  Neither has her boyfriend.   I let her know about the testing options but since she was sick with the typical symptoms she needed to quarantine and went over the protocol for isolating.   I suggested she have someone buy home testing kits and leave them at her door because she did not need to go out in public while sick.   She was agreeable to this or making an appt on line with the local drug store to be tested which I told her would be fine too.  I answered her questions and let her know I would pass this information on to Dr. Army Melia so she would be aware of her symptoms and that she was going to test for Covid.   She was agreeable to this plan.  I sent my notes to [facility].

## 2020-11-10 NOTE — Telephone Encounter (Signed)
Copied from [place] [id number]. Topic: General - Other >> Nov 10, 2020 11:01 AM Celene Kras wrote: Reason for CRM: Pt called to follow up with nurse call this morning. She states that she did test positive for covid and is requesting to have the medication sent in for her. Please advise.   [place], [place] [address] Phone: [phone] Fax: [phone] Hours: Not open 24 hours

## 2020-11-10 NOTE — Telephone Encounter (Signed)
Called pt let her know that her risk of complications is a 1 so she would not get the antiviral medication. Told pt to drink plenty of water, get plenty of rest and alternate ibuprofen and tylenol. Told pt to call us if her symptoms get worse.Pt verbalized understanding.  KP

## 2020-11-10 NOTE — Telephone Encounter (Signed)
Pt informed to take a covid test due to symptoms.  KP

## 2020-11-10 NOTE — Telephone Encounter (Signed)
Reason for Disposition  [1] COVID-19 infection suspected by caller or triager AND [2] mild symptoms (cough, fever, or others) AND [3] has not gotten tested yet  Answer Assessment - Initial Assessment Questions 1. RESPIRATORY STATUS: "Describe your breathing?" (e.g., wheezing, shortness of breath, unable to speak, severe coughing)      I'm having shortness of breath, fever, no taste/smell, headache, body aches, hacking dry cough, sore throat and very congested.    Has not done a Covid test.   Her boyfriend is having the same symptoms he lives with her.     2. ONSET: "When did this breathing problem begin?"      Symptoms started on 11/06/2020.   I had my stitches removed on the 8th.   I had foot surgery on July 24.    3. PATTERN "Does the difficult breathing come and go, or has it been constant since it started?"      I just don't feel good.    I feel a little short of breath.  4. SEVERITY: "How bad is your breathing?" (e.g., mild, moderate, severe)    - MILD: No SOB at rest, mild SOB with walking, speaks normally in sentences, can lie down, no retractions, pulse < 100.    - MODERATE: SOB at rest, SOB with minimal exertion and prefers to sit, cannot lie down flat, speaks in phrases, mild retractions, audible wheezing, pulse 100-120.    - SEVERE: Very SOB at rest, speaks in single words, struggling to breathe, sitting hunched forward, retractions, pulse > 120      No lungs problems or heart, diabetes.    5. RECURRENT SYMPTOM: "Have you had difficulty breathing before?" If Yes, ask: "When was the last time?" and "What happened that time?"      No    6. CARDIAC HISTORY: "Do you have any history of heart disease?" (e.g., heart attack, angina, bypass surgery, angioplasty)      No 7. LUNG HISTORY: "Do you have any history of lung disease?"  (e.g., pulmonary embolus, asthma, emphysema)     No 8. CAUSE: "What do you think is causing the breathing problem?"      Covid 9. OTHER SYMPTOMS: "Do you have any  other symptoms? (e.g., dizziness, runny nose, cough, chest pain, fever)    No   see above 10. O2 SATURATION MONITOR:  "Do you use an oxygen saturation monitor (pulse oximeter) at home?" If Yes, "What is your reading (oxygen level) today?" "What is your usual oxygen saturation reading?" (e.g., 95%)       No 11. PREGNANCY: "Is there any chance you are pregnant?" "When was your last menstrual period?"       N/A due age 26. TRAVEL: "Have you traveled out of the country in the last month?" (e.g., travel history, exposures)       No  Answer Assessment - Initial Assessment Questions 1. COVID-19 DIAGNOSIS: "Who made your COVID-19 diagnosis?" "Was it confirmed by a positive lab test or self-test?" If not diagnosed by a doctor (or NP/PA), ask "Are there lots of cases (community spread) where you live?" Note: See public health department website, if unsure.     Having symptoms of Covid   Not been tested 2. COVID-19 EXPOSURE: "Was there any known exposure to COVID before the symptoms began?" CDC Definition of close contact: within 6 feet (2 meters) for a total of 15 minutes or more over a 24-hour period.      Not sure 3. ONSET: "When did  the COVID-19 symptoms start?"      See protocol for shortness of breath 4. WORST SYMPTOM: "What is your worst symptom?" (e.g., cough, fever, shortness of breath, muscle aches)     *No Answer* 5. COUGH: "Do you have a cough?" If Yes, ask: "How bad is the cough?"       Yes dry hacking 6. FEVER: "Do you have a fever?" If Yes, ask: "What is your temperature, how was it measured, and when did it start?"     Yes low grade 7. RESPIRATORY STATUS: "Describe your breathing?" (e.g., shortness of breath, wheezing, unable to speak)      Shortness of breath with exertion but not while sleeping or sitting still. 8. BETTER-SAME-WORSE: "Are you getting better, staying the same or getting worse compared to yesterday?"  If getting worse, ask, "In what way?"     Not asked 9. HIGH RISK  DISEASE: "Do you have any chronic medical problems?" (e.g., asthma, heart or lung disease, weak immune system, obesity, etc.)     Denies any high risk illnesses. 10. VACCINE: "Have you had the COVID-19 vaccine?" If Yes, ask: "Which one, how many shots, when did you get it?"       Not asked 11. BOOSTER: "Have you received your COVID-19 booster?" If Yes, ask: "Which one and when did you get it?"       Not asked 12. PREGNANCY: "Is there any chance you are pregnant?" "When was your last menstrual period?"       N/A due to age 50. OTHER SYMPTOMS: "Do you have any other symptoms?"  (e.g., chills, fatigue, headache, loss of smell or taste, muscle pain, sore throat)       Yes body aches, headache, no taste/smell, nasal congestion, sore throat, fatigue, coughing a dry cough, some shortness of breath 14. O2 SATURATION MONITOR:  "Do you use an oxygen saturation monitor (pulse oximeter) at home?" If Yes, ask "What is your reading (oxygen level) today?" "What is your usual oxygen saturation reading?" (e.g., 95%)       No  Protocols used: Breathing Difficulty-A-AH, Coronavirus (COVID-19) Diagnosed or Suspected-A-AH

## 2020-11-11 ENCOUNTER — Emergency Department
Admission: EM | Admit: 2020-11-11 | Discharge: 2020-11-11 | Disposition: A | Discharge status: Home / Self Care | Payer: Medicare HMO | Attending: Emergency Medicine | Admitting: Emergency Medicine

## 2020-11-11 ENCOUNTER — Other Ambulatory Visit: Payer: Self-pay

## 2020-11-11 ENCOUNTER — Emergency Department: Payer: Medicare HMO

## 2020-11-11 DIAGNOSIS — Z87891 Personal history of nicotine dependence: Secondary | ICD-10-CM | POA: Diagnosis not present

## 2020-11-11 DIAGNOSIS — J111 Influenza due to unidentified influenza virus with other respiratory manifestations: Secondary | ICD-10-CM | POA: Insufficient documentation

## 2020-11-11 DIAGNOSIS — U071 COVID-19: Secondary | ICD-10-CM

## 2020-11-11 DIAGNOSIS — R059 Cough, unspecified: Secondary | ICD-10-CM | POA: Diagnosis present

## 2020-11-11 DIAGNOSIS — R0602 Shortness of breath: Secondary | ICD-10-CM | POA: Diagnosis not present

## 2020-11-11 LAB — CBC
HCT: 37.4 % (ref 36.0–46.0)
Hemoglobin: 12 g/dL (ref 12.0–15.0)
MCH: 31.4 pg (ref 26.0–34.0)
MCHC: 32.1 g/dL (ref 30.0–36.0)
MCV: 97.9 fL (ref 80.0–100.0)
Platelets: 119 10*3/uL — ABNORMAL LOW (ref 150–400)
RBC: 3.82 MIL/uL — ABNORMAL LOW (ref 3.87–5.11)
RDW: 13.5 % (ref 11.5–15.5)
WBC: 4.3 10*3/uL (ref 4.0–10.5)
nRBC: 0 % (ref 0.0–0.2)

## 2020-11-11 LAB — BASIC METABOLIC PANEL
Anion gap: 8 (ref 5–15)
BUN: 9 mg/dL (ref 8–23)
CO2: 26 mmol/L (ref 22–32)
Calcium: 8.7 mg/dL — ABNORMAL LOW (ref 8.9–10.3)
Chloride: 107 mmol/L (ref 98–111)
Creatinine, Ser: 0.8 mg/dL (ref 0.44–1.00)
GFR, Estimated: >60 mL/min (ref >60)
Glucose, Bld: 107 mg/dL — ABNORMAL HIGH (ref 70–99)
Potassium: 3.8 mmol/L (ref 3.5–5.1)
Sodium: 141 mmol/L (ref 135–145)

## 2020-11-11 LAB — TROPONIN I (HIGH SENSITIVITY)
Troponin I (High Sensitivity): 10 ng/L (ref <18)
Troponin I (High Sensitivity): 12 ng/L (ref <18)

## 2020-11-11 MED ORDER — LIDOCAINE VISCOUS HCL 2 % MT SOLN
15.0000 mL | Freq: Once | OROMUCOSAL | Status: AC
  Administered 2020-11-11: 15 mL via OROMUCOSAL
  Filled 2020-11-11: qty 15

## 2020-11-11 MED ORDER — KETOROLAC TROMETHAMINE 60 MG/2ML IM SOLN
15.0000 mg | Freq: Once | INTRAMUSCULAR | Status: AC
  Administered 2020-11-11: 15 mg via INTRAMUSCULAR
  Filled 2020-11-11: qty 2

## 2020-11-11 MED ORDER — MENTHOL 3 MG MT LOZG
1.0000 | LOZENGE | OROMUCOSAL | Status: DC | PRN
  Administered 2020-11-11: 3 mg via ORAL
  Filled 2020-11-11: qty 9

## 2020-11-11 MED ORDER — NAPROXEN 500 MG PO TABS
500.0000 mg | ORAL_TABLET | Freq: Two times a day (BID) | ORAL | 0 refills | Status: DC
Start: 2020-11-11 — End: 2021-01-24

## 2020-11-11 MED ORDER — ONDANSETRON 4 MG PO TBDP
8.0000 mg | ORAL_TABLET | Freq: Once | ORAL | Status: AC
  Administered 2020-11-11: 8 mg via ORAL
  Filled 2020-11-11: qty 2

## 2020-11-11 MED ORDER — PAXLOVID 20 X 150 MG & 10 X 100MG PO TBPK: 3.0000 | ORAL_TABLET | Freq: Two times a day (BID) | ORAL | 0 refills | Status: AC

## 2020-11-11 MED ORDER — ONDANSETRON 4 MG PO TBDP: 4.0000 mg | ORAL_TABLET | Freq: Three times a day (TID) | ORAL | 0 refills | Status: DC | PRN

## 2020-11-11 MED ORDER — LIDOCAINE VISCOUS HCL 2 % MT SOLN: 20.0000 mL | OROMUCOSAL | 0 refills | Status: DC | PRN

## 2020-11-11 MED ORDER — BENZONATATE 100 MG PO CAPS: 100.0000 mg | ORAL_CAPSULE | Freq: Three times a day (TID) | ORAL | 0 refills | Status: DC | PRN

## 2020-11-11 NOTE — Discharge Instructions (Addendum)
Do not take your Lipitor cholesterol medicine for the next week while taking Paxlovid due to potential medication interaction.

## 2020-11-11 NOTE — ED Provider Notes (Signed)
[facility] Emergency Department Provider Note  ____________________________________________  Time seen: Approximately 10:44 PM  I have reviewed the triage vital signs and the nursing notes.   HISTORY  Chief Complaint Sore Throat    HPI Claudia Gutierrez is a 70 y.o. female with a past history of GERD, depression who comes ED complaining of cough, congestion, sore throat, body aches, fatigue, chills that started yesterday.  Took a home COVID test which was positive.  Complains of decreased oral intake due to pain in the throat with swallowing.  Also feels mildly short of breath.  No chest pain.  No syncope vomiting or diarrhea.  Symptoms are constant, no aggravating or alleviating factors.  Nonradiating.  Past Medical History:  Diagnosis Date   Acid reflux    Anxiety    Complication of anesthesia    ponv   History of kidney stones    PONV (postoperative nausea and vomiting)    2002 colonoscopy   Wears dentures    Full upper     Patient Active Problem List   Diagnosis Date Noted   Dermatochalasis of both upper eyelids 07/06/2020   Ptosis of both eyebrows 07/06/2020   Anal fissure 05/23/2020   Aortic atherosclerosis ([place]) 04/16/2020   Sacro-iliac pain 04/14/2020   Palpitation 04/14/2020   History of colon polyps 10/05/2019   Pancreatic cyst 10/05/2019   GERD (gastroesophageal reflux disease) 10/12/2013   Major depressive disorder, single episode, unspecified 10/12/2013     Past Surgical History:  Procedure Laterality Date   ACHILLES TENDON REPAIR  2008   ARTHRODESIS METATARSALPHALANGEAL JOINT (MTPJ) Left 10/18/2020   Procedure: ARTHRODESIS METATARSALPHALANGEAL JOINT (MTPJ);  Surgeon: Samara Deist, DPM;  Location: [place];  Service: Podiatry;  Laterality: Left;  Anesthesia- General with local   BIOPSY  10/21/2019   Procedure: BIOPSY;  Surgeon: Milus Banister, MD;  Location: [facility];  Service: Endoscopy;;   BOTOX INJECTION  N/A 06/26/2020   Procedure: BOTOX INJECTION into internal anal sphincter muscles;  Surgeon: Fredirick Maudlin, MD;  Location: [facility];  Service: General;  Laterality: N/A;  Provider requesting 30 minutes for procedure   BREAST CYST ASPIRATION Right 2010   CATARACT EXTRACTION W/PHACO Right 12/21/2019   Procedure: CATARACT EXTRACTION PHACO AND INTRAOCULAR LENS PLACEMENT ([place]) RIGHT 4.15 00:31.2;  Surgeon: Birder Robson, MD;  Location: [place];  Service: Ophthalmology;  Laterality: Right;   CATARACT EXTRACTION W/PHACO Left 01/11/2020   Procedure: CATARACT EXTRACTION PHACO AND INTRAOCULAR LENS PLACEMENT ([facility]) LEFT 4.78 00:26.5;  Surgeon: Birder Robson, MD;  Location: [place];  Service: Ophthalmology;  Laterality: Left;   COLONOSCOPY WITH PROPOFOL N/A 09/13/2019   Procedure: COLONOSCOPY WITH PROPOFOL;  Surgeon: Jonathon Bellows, MD;  Location: [facility];  Service: Gastroenterology;  Laterality: N/A;   ESOPHAGOGASTRODUODENOSCOPY (EGD) WITH PROPOFOL N/A 10/21/2019   Procedure: ESOPHAGOGASTRODUODENOSCOPY (EGD) WITH PROPOFOL;  Surgeon: Milus Banister, MD;  Location: [facility];  Service: Endoscopy;  Laterality: N/A;   EUS N/A 10/21/2019   Procedure: UPPER ENDOSCOPIC ULTRASOUND (EUS) RADIAL;  Surgeon: Milus Banister, MD;  Location: [facility];  Service: Endoscopy;  Laterality: N/A;   FINE NEEDLE ASPIRATION N/A 10/21/2019   Procedure: FINE NEEDLE ASPIRATION (FNA) LINEAR;  Surgeon: Milus Banister, MD;  Location: [facility];  Service: Endoscopy;  Laterality: N/A;   HEMORRHOID SURGERY N/A 06/26/2020   Procedure: HEMORRHOIDECTOMY;  Surgeon: Fredirick Maudlin, MD;  Location: [facility];  Service: General;  Laterality: N/A;     Prior to Admission medications  Medication Sig Start Date End Date Taking? Authorizing Provider  benzonatate (TESSALON PERLES) 100 MG capsule Take 1 capsule (100 mg total) by mouth 3 (three) times daily as needed for cough. 11/11/20 11/11/21 Yes Carrie Mew, MD  lidocaine (XYLOCAINE) 2 % solution Use as directed 20 mLs in the mouth or throat every 2 (two) hours as needed for mouth pain. Gargle and spit out 11/11/20  Yes Carrie Mew, MD  naproxen (NAPROSYN) 500 MG tablet Take 1 tablet (500 mg total) by mouth 2 (two) times daily with a meal. 11/11/20  Yes Carrie Mew, MD  Nirmatrelvir & Ritonavir (PAXLOVID) 20 x 150 MG & 10 x '100MG'$  TBPK Take 3 tablets by mouth in the morning and at bedtime for 5 days. Take according to package instruction 11/11/20 11/16/20 Yes Carrie Mew, MD  ondansetron (ZOFRAN ODT) 4 MG disintegrating tablet Take 1 tablet (4 mg total) by mouth every 8 (eight) hours as needed for nausea or vomiting. 11/11/20  Yes Carrie Mew, MD  atorvastatin (LIPITOR) 10 MG tablet Take 1 tablet (10 mg total) by mouth daily. 07/27/20   Glean Hess, MD  BIOTIN PO Take 1,000 mg by mouth daily.    [provider]  Cholecalciferol (VITAMIN D-3) 125 MCG (5000 UT) TABS Take 125 mcg by mouth daily.    [provider]  citalopram (CELEXA) 20 MG tablet Take 1 tablet by mouth once daily 10/19/20   Glean Hess, MD  fluticasone furoate-vilanterol (BREO ELLIPTA) 100-25 MCG/INH AEPB Inhale 1 puff into the lungs daily. Patient taking differently: Inhale 1 puff into the lungs as needed. 07/26/20   Glean Hess, MD  folic acid (FOLVITE) [id number] MCG tablet Take 800 mcg by mouth daily.    [provider]  ibuprofen (ADVIL) 800 MG tablet Take 1 tablet (800 mg total) by mouth every 8 (eight) hours as needed for moderate pain. 06/26/20   Fredirick Maudlin, MD  Lactobacillus (ACIDOPHILUS) CAPS capsule Take 1 capsule by mouth daily.    [provider]  Omega-3 Fatty Acids (FISH OIL PO) Take 1 capsule by mouth daily.    [provider]  omeprazole (PRILOSEC) 20 MG capsule Take 1 capsule (20 mg total) by mouth daily. 01/14/20   Glean Hess, MD  ondansetron (ZOFRAN) 4 MG tablet Take 1 tablet (4  mg total) by mouth daily as needed for nausea or vomiting. 10/18/20 10/18/21  Samara Deist, DPM  oxyCODONE-acetaminophen (PERCOCET) 5-325 MG tablet Take 1-2 tablets by mouth every 6 (six) hours as needed for severe pain. Max 6 tabs per day 10/18/20   Samara Deist, DPM  traZODone (DESYREL) 100 MG tablet Take 1.5-2 tablets (150-200 mg total) by mouth at bedtime as needed. for sleep Patient taking differently: Take 100 mg by mouth at bedtime as needed for sleep. 03/10/20   Glean Hess, MD     Allergies Azithromycin, Paroxetine hcl, Ciprofloxacin, Sodium clavulanate, and Codeine   Family History  Problem Relation Age of Onset   Breast cancer Sister 71       and stomach ca   Diabetes Sister    Colon cancer Sister 11   Lung cancer Father    Diabetes Father    Heart disease Father    Hypertension Father    Stroke Paternal Grandmother     Social History Social History   Tobacco Use   Smoking status: Former    Packs/day: 0.50    Years: 10.00    Pack years: 5.00  Types: Cigarettes    Quit date: 2002    Years since quitting: 20.6   Smokeless tobacco: Never  Vaping Use   Vaping Use: Never used  Substance Use Topics   Alcohol use: Yes    Comment: socially   Drug use: Never    Review of Systems  Constitutional:   No fever positive chills.  ENT:   Positive sore throat.  Positive rhinorrhea. Cardiovascular:   No chest pain or syncope. Respiratory:   Positive dyspnea positive cough. Gastrointestinal:   Negative for abdominal pain, vomiting and diarrhea.  Musculoskeletal:   Negative for focal pain or swelling All other systems reviewed and are negative except as documented above in ROS and HPI.  ____________________________________________   PHYSICAL EXAM:  VITAL SIGNS: ED Triage Vitals  Enc Vitals Group     BP 11/11/20 1614 (!) 151/68     Pulse Rate 11/11/20 1611 91     Resp 11/11/20 1611 20     Temp 11/11/20 1611 98.8 F (37.1 C)     Temp Source 11/11/20  1611 Oral     SpO2 11/11/20 1611 96 %     Weight --      Height 11/11/20 1612 '5\' 6"'$  (1.676 m)     Head Circumference --      Peak Flow --      Pain Score 11/11/20 1612 5     Pain Loc --      Pain Edu? --      Excl. in [place]? --     Vital signs reviewed, nursing assessments reviewed.   Constitutional:   Alert and oriented. Non-toxic appearance. Eyes:   Conjunctivae are normal. EOMI. PERRL. ENT      Head:   Normocephalic and atraumatic.      Nose:   Normal      Mouth/Throat:   Oropharyngeal erythema.  No mass.  Moist mucosa.      Neck:   No meningismus. Full ROM. Hematological/Lymphatic/Immunilogical:   No cervical lymphadenopathy. Cardiovascular:   RRR. Symmetric bilateral radial and DP pulses.  No murmurs. Cap refill less than 2 seconds. Respiratory:   Normal respiratory effort without tachypnea/retractions. Breath sounds are clear and equal bilaterally. No wheezes/rales/rhonchi. Gastrointestinal:   Soft and nontender. Non distended. There is no CVA tenderness.  No rebound, rigidity, or guarding. Genitourinary:   deferred Musculoskeletal:   Normal range of motion in all extremities. No joint effusions.  No lower extremity tenderness.  No edema. Neurologic:   Normal speech and language.  Motor grossly intact. No acute focal neurologic deficits are appreciated.  Skin:    Skin is warm, dry and intact. No rash noted.  No petechiae, purpura, or bullae.  ____________________________________________    LABS (pertinent positives/negatives) (all labs ordered are listed, but only abnormal results are displayed) Labs Reviewed  BASIC METABOLIC PANEL - Abnormal; Notable for the following components:      Result Value   Glucose, Bld 107 (*)    Calcium 8.7 (*)    All other components within normal limits  CBC - Abnormal; Notable for the following components:   RBC 3.82 (*)    Platelets 119 (*)    All other components within normal limits  TROPONIN I (HIGH SENSITIVITY)  TROPONIN I  (HIGH SENSITIVITY)   ____________________________________________   EKG  Interpreted by me Normal sinus rhythm rate of 70, normal axis and intervals.  Normal QRS ST segments and T waves.  No ischemic changes.  ____________________________________________    [id number]  DG Chest 2 View  Result Date: 11/11/2020 CLINICAL DATA:  Shortness of breath EXAM: CHEST - 2 VIEW COMPARISON:  Chest x-ray dated 04/14/2020 FINDINGS: Heart size and mediastinal contours are within normal limits. Lungs are clear. No pleural effusion or pneumothorax is seen. Mild degenerative spondylosis of the thoracic spine. Osseous structures about the chest are otherwise unremarkable. IMPRESSION: No active cardiopulmonary disease. No evidence of pneumonia or pulmonary edema. Electronically Signed   By: Franki Cabot M.D.   On: 11/11/2020 16:59    ____________________________________________   PROCEDURES Procedures  ____________________________________________    CLINICAL IMPRESSION / ASSESSMENT AND PLAN / ED COURSE  Medications ordered in the ED: Medications  menthol-cetylpyridinium (CEPACOL) lozenge 3 mg (3 mg Oral Given 11/11/20 2004)  lidocaine (XYLOCAINE) 2 % viscous mouth solution 15 mL (15 mLs Mouth/Throat Given 11/11/20 1725)  ondansetron (ZOFRAN-ODT) disintegrating tablet 8 mg (8 mg Oral Given 11/11/20 1726)  ketorolac (TORADOL) injection 15 mg (15 mg Intramuscular Given 11/11/20 1726)    Pertinent labs & imaging results that were available during my care of the patient were reviewed by me and considered in my medical decision making (see chart for details).  Claudia Gutierrez was evaluated in Emergency Department on 11/11/2020 for the symptoms described in the history of present illness. She was evaluated in the context of the global COVID-19 pandemic, which necessitated consideration that the patient might be at risk for infection with the SARS-CoV-2 virus that causes COVID-19. Institutional protocols and  algorithms that pertain to the evaluation of patients at risk for COVID-19 are in a state of rapid change based on information released by regulatory bodies including the CDC and federal and state organizations. These policies and algorithms were followed during the patient's care in the ED.   Patient presents with influenza-like illness in the setting of known COVID infection diagnosed yesterday by home test.  Vital signs are normal, she is not hypoxic, not seriously dehydrated.  Labs are reassuring.  Chest x-ray unremarkable.  She has had 2 mRNA COVID vaccines in the past, by age she needs criteria for Paxlovid.  Will advise her to discontinue statin for the next 5 days while taking the Paxlovid, follow-up with PCP.      ____________________________________________   FINAL CLINICAL IMPRESSION(S) / ED DIAGNOSES    Final diagnoses:  COVID-19 virus infection  Influenza-like illness     ED Discharge Orders          Ordered    Nirmatrelvir & Ritonavir (PAXLOVID) 20 x 150 MG & 10 x '100MG'$  TBPK  2 times daily        11/11/20 2244    naproxen (NAPROSYN) 500 MG tablet  2 times daily with meals        11/11/20 2244    ondansetron (ZOFRAN ODT) 4 MG disintegrating tablet  Every 8 hours PRN        11/11/20 2244    benzonatate (TESSALON PERLES) 100 MG capsule  3 times daily PRN        11/11/20 2244    lidocaine (XYLOCAINE) 2 % solution  Every 2 hours PRN        11/11/20 2244            Portions of this note were generated with dragon dictation software. Dictation errors may occur despite best attempts at proofreading.    Carrie Mew, MD 11/11/20 [phone]

## 2020-11-11 NOTE — ED Notes (Signed)
Pt able to tolerate ice chips.

## 2020-11-11 NOTE — ED Triage Notes (Addendum)
Pt to ER via [facility] with complaints of cough/ congestion/ sore throat that stared yesterday. Patient reports taking an at home covid test yesterday and tested positive. Also reports having difficulty swallowing due to pain in throat. Also reports generalized body aches and shortness of breath. Productive cough. Denies chest pain.

## 2020-11-21 ENCOUNTER — Telehealth: Payer: Self-pay | Admitting: Internal Medicine

## 2020-11-21 NOTE — Telephone Encounter (Signed)
Copied from [place] [id number]. Topic: Medicare AWV >> Nov 21, 2020  1:17 PM Cher Nakai R wrote: Reason for CRM:  Left message for patient to call back and schedule Medicare Annual Wellness Visit (AWV) in office.   If unable to come into the office for AWV,  please offer to do virtually or by telephone.  No hx of AWV eligible for AWVI as of 05/30/2020  Please schedule at anytime with [facility] Health Advisor.      40 Minutes appointment   Any questions, please call me at [phone]

## 2020-11-29 DIAGNOSIS — M2022 Hallux rigidus, left foot: Secondary | ICD-10-CM | POA: Diagnosis not present

## 2020-12-06 ENCOUNTER — Ambulatory Visit: Payer: Medicare HMO

## 2020-12-07 ENCOUNTER — Telehealth: Payer: Self-pay | Admitting: Internal Medicine

## 2020-12-07 NOTE — Telephone Encounter (Signed)
Copied from [place] [id number]. Topic: Medicare AWV >> Dec 07, 2020 11:18 AM Cher Nakai R wrote: Reason for CRM:  No answer unable to leave a message for patient to call back and schedule Medicare Annual Wellness Visit (AWV) in office.   If unable to come into the office for AWV,  please offer to do virtually or by telephone.  No hx of AWV eligible for AWVI as of 05/30/2020  Please schedule at anytime with [facility] Health Advisor.      40 Minutes appointment   Any questions, please call me at [phone]

## 2020-12-18 ENCOUNTER — Other Ambulatory Visit: Payer: Self-pay | Admitting: Internal Medicine

## 2020-12-18 DIAGNOSIS — F325 Major depressive disorder, single episode, in full remission: Secondary | ICD-10-CM

## 2020-12-18 NOTE — Telephone Encounter (Signed)
Requested medication (s) are due for refill today: yes  Requested medication (s) are on the active medication list: yes but SIG is not the same. Pt taking '100mg'$  at night and on med recordis:  9 months ago (03/10/2020)  traZODone (DESYREL) 100 MG tablet  Take 1.5-2 tablets (150-200 mg total) by mouth at bedtime as needed. for sleep  Patient taking differently: Take 100 mg by mouth at bedtime as needed for sleep.  Dispense: 180 tablet     Refills: 1     Start: 03/10/2020      By:  Glean Hess, MD      Encounter   Last refill: yes  Future visit scheduled:yes  Notes to clinic:  please look at [facility] and rewrite rx if needed.   Requested Prescriptions  Pending Prescriptions Disp Refills   traZODone (DESYREL) 100 MG tablet [Pharmacy Med Name: traZODone HCl 100 MG Oral Tablet] 30 tablet 0    Sig: TAKE 1 TABLET BY MOUTH AT BEDTIME AS NEEDED FOR SLEEP     Psychiatry: Antidepressants - Serotonin Modulator Passed - 12/18/2020  9:32 AM      Passed - Completed PHQ-2 or PHQ-9 in the last 360 days      Passed - Valid encounter within last 6 months    Recent Outpatient Visits           2 months ago Subacute sinusitis, unspecified location   [facility] Glean Hess, MD   4 months ago Major depressive disorder with single episode, in full remission [facility])   [facility] Glean Hess, MD   7 months ago Suspected COVID-19 virus infection   [facility] Glean Hess, MD   8 months ago Hitchcock, MD   10 months ago Gastroesophageal reflux disease, unspecified whether esophagitis present   [facility] Glean Hess, MD       Future Appointments             In 1 month Army Melia, Jesse Sans, MD [facility], [facility]

## 2021-01-09 DIAGNOSIS — M545 Low back pain, unspecified: Secondary | ICD-10-CM | POA: Diagnosis not present

## 2021-01-16 ENCOUNTER — Encounter: Payer: Self-pay | Admitting: General Surgery

## 2021-01-24 ENCOUNTER — Other Ambulatory Visit: Payer: Self-pay

## 2021-01-24 MED ORDER — CITALOPRAM HYDROBROMIDE 20 MG PO TABS: 20.0000 mg | ORAL_TABLET | Freq: Every day | ORAL | 0 refills | Status: DC

## 2021-01-29 DIAGNOSIS — M2022 Hallux rigidus, left foot: Secondary | ICD-10-CM | POA: Diagnosis not present

## 2021-02-02 ENCOUNTER — Other Ambulatory Visit: Payer: Self-pay | Admitting: Internal Medicine

## 2021-02-02 NOTE — Telephone Encounter (Signed)
Medication Refill - Medication: omeprazole (PRILOSEC) 20 MG capsule   Has the patient contacted their pharmacy? Yes.   (Agent: If no, request that the patient contact the pharmacy for the refill. If patient does not wish to contact the pharmacy document the reason why and proceed with request.) (Agent: If yes, when and what did the pharmacy advise?)  Preferred Pharmacy (with phone number or street name):  [place], [place] [address]  Phone: [phone] Fax: [phone]   Has the patient been seen for an appointment in the last year OR does the patient have an upcoming appointment? Yes.    Agent: Please be advised that RX refills may take up to 3 business days. We ask that you follow-up with your pharmacy.

## 2021-02-03 MED ORDER — OMEPRAZOLE 20 MG PO CPDR: 20.0000 mg | DELAYED_RELEASE_CAPSULE | Freq: Every day | ORAL | 0 refills | Status: DC

## 2021-02-03 NOTE — Telephone Encounter (Signed)
Requested Prescriptions  Pending Prescriptions Disp Refills  . omeprazole (PRILOSEC) 20 MG capsule 90 capsule 0    Sig: Take 1 capsule (20 mg total) by mouth daily.     Gastroenterology: Proton Pump Inhibitors Passed - 02/02/2021  4:04 PM      Passed - Valid encounter within last 12 months    Recent Outpatient Visits          3 months ago Subacute sinusitis, unspecified location   [facility] Glean Hess, MD   6 months ago Major depressive disorder with single episode, in full remission [facility])   [facility] Glean Hess, MD   9 months ago Suspected COVID-19 virus infection   [facility] Glean Hess, MD   9 months ago [place], [place]   12 months ago Gastroesophageal reflux disease, unspecified whether esophagitis present   [facility] Glean Hess, MD      Future Appointments            In 5 days Glean Hess, MD [facility], [facility]

## 2021-02-08 ENCOUNTER — Encounter: Payer: Medicare Other | Admitting: Internal Medicine

## 2021-02-16 ENCOUNTER — Other Ambulatory Visit: Payer: Self-pay | Admitting: Internal Medicine

## 2021-02-16 NOTE — Telephone Encounter (Signed)
Requested medications are due for refill today yes  Requested medications are on the active medication list yes  Last refill 01/24/21  Last visit 07/26/20  Future visit scheduled 04/25/21  Notes to clinic Has already had a curtesy refill but upcoming appointment not until 04/25/21, please assess. Requested Prescriptions  Pending Prescriptions Disp Refills   citalopram (CELEXA) 20 MG tablet [Pharmacy Med Name: Citalopram Hydrobromide 20 MG Oral Tablet] 30 tablet 0    Sig: Take 1 tablet by mouth once daily     Psychiatry:  Antidepressants - SSRI Passed - 02/16/2021  2:12 PM      Passed - Completed PHQ-2 or PHQ-9 in the last 360 days      Passed - Valid encounter within last 6 months    Recent Outpatient Visits           4 months ago Subacute sinusitis, unspecified location   [facility] Glean Hess, MD   6 months ago Major depressive disorder with single episode, in full remission [facility])   [facility] Glean Hess, MD   9 months ago Suspected COVID-19 virus infection   [facility] Glean Hess, MD   10 months ago [facility] Glean Hess, MD   1 year ago Gastroesophageal reflux disease, unspecified whether esophagitis present   [facility] Glean Hess, MD       Future Appointments             In 2 months Army Melia Jesse Sans, MD [facility], [facility]

## 2021-02-19 ENCOUNTER — Other Ambulatory Visit: Payer: Self-pay | Admitting: Internal Medicine

## 2021-02-19 NOTE — Telephone Encounter (Signed)
Pt is requesting to have this refilled as well, please advise  atorvastatin (LIPITOR) 10 MG tablet

## 2021-02-19 NOTE — Telephone Encounter (Signed)
Pharmacy called to request refill for Atorvastatin 10Mg  / stated they couldn't send electronically due to being a different [facility] location/ please advise

## 2021-02-19 NOTE — Telephone Encounter (Signed)
Requested medication (s) are due for refill today:   Yes  Requested medication (s) are on the active medication list:   Yes  Future visit scheduled:   Yes for 04/25/2021 with Dr. Army Melia   Last ordered: 07/27/2020 #90, 1 refill  There is a note attached to this request for Celexa that a refill is needed for atorvastatin 10 mg however it can't be sent electronically because of it being a [facility] from a different location per the note attached to the request.   Wasn't sure how to handle this request for the atorvastatin.   Thanks   Requested Prescriptions  Pending Prescriptions Disp Refills   citalopram (CELEXA) 20 MG tablet [Pharmacy Med Name: Citalopram Hydrobromide 20 MG Oral Tablet] 30 tablet 0    Sig: Take 1 tablet by mouth once daily     Psychiatry:  Antidepressants - SSRI Passed - 02/19/2021 11:34 AM      Passed - Completed PHQ-2 or PHQ-9 in the last 360 days      Passed - Valid encounter within last 6 months    Recent Outpatient Visits           4 months ago Subacute sinusitis, unspecified location   [facility] Glean Hess, MD   6 months ago Major depressive disorder with single episode, in full remission [facility])   [facility] Glean Hess, MD   9 months ago Suspected COVID-19 virus infection   [facility] Glean Hess, MD   10 months ago [facility] Glean Hess, MD   1 year ago Gastroesophageal reflux disease, unspecified whether esophagitis present   [facility] Glean Hess, MD       Future Appointments             In 2 months Army Melia Jesse Sans, MD [facility], [facility]

## 2021-02-20 ENCOUNTER — Other Ambulatory Visit: Payer: Self-pay | Admitting: Internal Medicine

## 2021-02-20 DIAGNOSIS — I700 Atherosclerosis of aorta: Secondary | ICD-10-CM

## 2021-02-20 MED ORDER — ATORVASTATIN CALCIUM 10 MG PO TABS: 10.0000 mg | ORAL_TABLET | Freq: Every day | ORAL | 1 refills | Status: DC

## 2021-02-20 NOTE — Telephone Encounter (Signed)
Medication Refill - Medication: Atorvastatin   Has the patient contacted their pharmacy? Yes.   Pharmacy did not include this medication in their request yesterday. Please advise.  (Agent: If no, request that the patient contact the pharmacy for the refill. If patient does not wish to contact the pharmacy document the reason why and proceed with request.) (Agent: If yes, when and what did the pharmacy advise?)  Preferred Pharmacy (with phone number or street name):  [address] [place] [address], [place] [address]  Phone: [phone] Fax: [phone]  Hours: Not open 24 hours    Has the patient been seen for an appointment in the last year OR does the patient have an upcoming appointment? Yes.    Agent: Please be advised that RX refills may take up to 3 business days. We ask that you follow-up with your pharmacy.

## 2021-02-20 NOTE — Telephone Encounter (Signed)
Requested Prescriptions  Pending Prescriptions Disp Refills  . atorvastatin (LIPITOR) 10 MG tablet 90 tablet 1    Sig: Take 1 tablet (10 mg total) by mouth daily.     Cardiovascular:  Antilipid - Statins Failed - 02/20/2021 11:22 AM      Failed - Total Cholesterol in normal range and within 360 days    Cholesterol, Total  Date Value Ref Range Status  07/26/2020 297 (H) 100 - 199 mg/dL Final         Failed - LDL in normal range and within 360 days    LDL Chol Calc (NIH)  Date Value Ref Range Status  07/26/2020 192 (H) 0 - 99 mg/dL Final         Passed - HDL in normal range and within 360 days    HDL  Date Value Ref Range Status  07/26/2020 81 >39 mg/dL Final         Passed - Triglycerides in normal range and within 360 days    Triglycerides  Date Value Ref Range Status  07/26/2020 135 0 - 149 mg/dL Final         Passed - Patient is not pregnant      Passed - Valid encounter within last 12 months    Recent Outpatient Visits          4 months ago Subacute sinusitis, unspecified location   [facility] Glean Hess, MD   6 months ago Major depressive disorder with single episode, in full remission [facility])   [facility] Glean Hess, MD   9 months ago Suspected COVID-19 virus infection   [facility] Glean [facility], MD   10 months ago [facility] Glean Hess, MD   1 year ago Gastroesophageal reflux disease, unspecified whether esophagitis present   [facility] Glean Hess, MD      Future Appointments            In 2 months Army Melia Jesse Sans, MD [facility], [facility]

## 2021-02-21 ENCOUNTER — Encounter: Payer: Self-pay | Admitting: Internal Medicine

## 2021-03-27 ENCOUNTER — Other Ambulatory Visit: Payer: Self-pay | Admitting: Internal Medicine

## 2021-03-27 DIAGNOSIS — F325 Major depressive disorder, single episode, in full remission: Secondary | ICD-10-CM

## 2021-03-27 MED ORDER — CITALOPRAM HYDROBROMIDE 20 MG PO TABS: 20.0000 mg | ORAL_TABLET | Freq: Every day | ORAL | 1 refills | Status: DC

## 2021-03-27 MED ORDER — TRAZODONE HCL 100 MG PO TABS: 100.0000 mg | ORAL_TABLET | Freq: Every evening | ORAL | 1 refills | Status: DC | PRN

## 2021-03-27 NOTE — Telephone Encounter (Signed)
Please review. Last office visit 10/11/2020.  KP

## 2021-03-28 NOTE — Telephone Encounter (Signed)
Requested Prescriptions  Pending Prescriptions Disp Refills   traZODone (DESYREL) 100 MG tablet [Pharmacy Med Name: traZODone HCl 100 MG Oral Tablet] 90 tablet 0    Sig: TAKE 1 TABLET BY MOUTH AT BEDTIME AS NEEDED FOR SLEEP     Psychiatry: Antidepressants - Serotonin Modulator Passed - 03/27/2021  9:04 AM      Passed - Completed PHQ-2 or PHQ-9 in the last 360 days      Passed - Valid encounter within last 6 months    Recent Outpatient Visits          5 months ago Subacute sinusitis, unspecified location   [facility] Glean Hess, MD   8 months ago Major depressive disorder with single episode, in full remission [facility])   [facility] Glean Hess, MD   10 months ago Suspected COVID-19 virus infection   [facility] Glean Hess, MD   11 months ago [facility] Glean Hess, MD   1 year ago Gastroesophageal reflux disease, unspecified whether esophagitis present   [facility] Glean Hess, MD      Future Appointments            In 4 weeks Army Melia Jesse Sans, MD [facility], [facility]

## 2021-04-25 ENCOUNTER — Other Ambulatory Visit: Payer: Self-pay

## 2021-04-25 ENCOUNTER — Encounter: Payer: Self-pay | Admitting: Internal Medicine

## 2021-04-25 ENCOUNTER — Ambulatory Visit (INDEPENDENT_AMBULATORY_CARE_PROVIDER_SITE_OTHER): Payer: Medicare HMO | Admitting: Internal Medicine

## 2021-04-25 VITALS — BP 122/60 | HR 64 | Ht 66.0 in | Wt 165.6 lb

## 2021-04-25 DIAGNOSIS — K219 Gastro-esophageal reflux disease without esophagitis: Secondary | ICD-10-CM

## 2021-04-25 DIAGNOSIS — Z0000 Encounter for general adult medical examination without abnormal findings: Secondary | ICD-10-CM | POA: Diagnosis not present

## 2021-04-25 DIAGNOSIS — I700 Atherosclerosis of aorta: Secondary | ICD-10-CM

## 2021-04-25 DIAGNOSIS — Z13820 Encounter for screening for osteoporosis: Secondary | ICD-10-CM

## 2021-04-25 DIAGNOSIS — R0602 Shortness of breath: Secondary | ICD-10-CM | POA: Diagnosis not present

## 2021-04-25 DIAGNOSIS — Z1231 Encounter for screening mammogram for malignant neoplasm of breast: Secondary | ICD-10-CM | POA: Diagnosis not present

## 2021-04-25 DIAGNOSIS — F325 Major depressive disorder, single episode, in full remission: Secondary | ICD-10-CM | POA: Diagnosis not present

## 2021-04-25 DIAGNOSIS — Z1159 Encounter for screening for other viral diseases: Secondary | ICD-10-CM | POA: Diagnosis not present

## 2021-04-25 DIAGNOSIS — R69 Illness, unspecified: Secondary | ICD-10-CM | POA: Diagnosis not present

## 2021-04-25 DIAGNOSIS — Z23 Encounter for immunization: Secondary | ICD-10-CM | POA: Diagnosis not present

## 2021-04-25 MED ORDER — FLUTICASONE FUROATE-VILANTEROL 100-25 MCG/ACT IN AEPB: 1.0000 | INHALATION_SPRAY | Freq: Every day | RESPIRATORY_TRACT | 11 refills | Status: DC

## 2021-04-25 MED ORDER — OMEPRAZOLE 20 MG PO CPDR: 20.0000 mg | DELAYED_RELEASE_CAPSULE | Freq: Two times a day (BID) | ORAL | 1 refills | Status: DC

## 2021-04-25 NOTE — Progress Notes (Signed)
Date:  04/25/2021   Name:  Claudia Gutierrez   DOB:  December 23, 1950   MRN:  [id number]   Chief Complaint: Annual Exam Claudia Gutierrez is a 71 y.o. female who presents today for her Complete Annual Exam. She feels fairly well. She reports exercising - not at this time. She reports she is sleeping fairly well. Breast complaints - Rt Breast Pain.  Mammogram: 05/2020 [facility] DEXA: none Pap smear: discontinued Colonoscopy: 08/2019 repeat 10 yrs  Immunization History  Administered Date(s) Administered   Fluad Quad(high Dose 65+) 02/07/2020   Influenza, Seasonal, Injecte, Preservative Fre 01/13/2007, 01/08/2010, 01/13/2013   Influenza,inj,Quad PF,6+ Mos 02/28/2017   Influenza-Unspecified 12/30/2013   Moderna Sars-Covid-2 Vaccination 05/15/2019, 06/11/2019   Pneumococcal Conjugate-13 10/05/2019    Gastroesophageal Reflux She complains of abdominal pain, heartburn and wheezing. She reports no chest pain or no coughing. This is a recurrent problem. The problem occurs rarely. Pertinent negatives include no fatigue. She has tried a PPI for the symptoms. The treatment provided significant relief.  Hyperlipidemia This is a chronic problem. The problem is controlled. Associated symptoms include shortness of breath. Pertinent negatives include no chest pain. Current antihyperlipidemic treatment includes statins.  Depression        This is a chronic problem.  The problem has been resolved since onset.  Associated symptoms include no fatigue and no headaches.  Past treatments include SSRIs - Selective serotonin reuptake inhibitors.  Compliance with treatment is good.  Previous treatment provided significant relief. Shortness of Breath This is a chronic problem. The problem has been unchanged. Associated symptoms include abdominal pain and wheezing. Pertinent negatives include no chest pain, fever, headaches, leg swelling, rash or vomiting. The symptoms are aggravated by exercise and lying flat. She has  tried beta agonist inhalers and steroid inhalers for the symptoms. The treatment provided moderate relief. There is no history of asthma, chronic lung disease or COPD.   Lab Results  Component Value Date   NA 141 11/11/2020   K 3.8 11/11/2020   CO2 26 11/11/2020   GLUCOSE 107 (H) 11/11/2020   BUN 9 11/11/2020   CREATININE 0.80 11/11/2020   CALCIUM 8.7 (L) 11/11/2020   GFRNONAA >60 11/11/2020   Lab Results  Component Value Date   CHOL 297 (H) 07/26/2020   HDL 81 07/26/2020   LDLCALC 192 (H) 07/26/2020   TRIG 135 07/26/2020   CHOLHDL 3.7 07/26/2020   Lab Results  Component Value Date   TSH 3.440 02/07/2020   No results found for: HGBA1C Lab Results  Component Value Date   WBC 4.3 11/11/2020   HGB 12.0 11/11/2020   HCT 37.4 11/11/2020   MCV 97.9 11/11/2020   PLT 119 (L) 11/11/2020   Lab Results  Component Value Date   ALT 20 02/07/2020   AST 28 02/07/2020   ALKPHOS 60 02/07/2020   BILITOT 0.4 02/07/2020   No results found for: 25OHVITD2, 25OHVITD3, VD25OH   Review of Systems  Constitutional:  Negative for chills, fatigue and fever.  HENT:  Negative for congestion, hearing loss, tinnitus, trouble swallowing and voice change.   Eyes:  Negative for visual disturbance.  Respiratory:  Positive for shortness of breath and wheezing. Negative for cough and chest tightness.   Cardiovascular:  Negative for chest pain, palpitations and leg swelling.  Gastrointestinal:  Positive for abdominal pain and heartburn. Negative for constipation, diarrhea and vomiting.  Endocrine: Negative for polydipsia and polyuria.  Genitourinary:  Negative for dysuria, frequency, genital sores, vaginal  bleeding and vaginal discharge.  Musculoskeletal:  Negative for arthralgias, gait problem and joint swelling.  Skin:  Negative for color change and rash.  Neurological:  Negative for dizziness, tremors, light-headedness and headaches.  Hematological:  Negative for adenopathy. Does not bruise/bleed  easily.  Psychiatric/Behavioral:  Positive for depression. Negative for dysphoric mood and sleep disturbance. The patient is not nervous/anxious.    Patient Active Problem List   Diagnosis Date Noted   Dermatochalasis of both upper eyelids 07/06/2020   Ptosis of both eyebrows 07/06/2020   Anal fissure 05/23/2020   Aortic atherosclerosis ([place]) 04/16/2020   Sacro-iliac pain 04/14/2020   Palpitation 04/14/2020   History of colon polyps 10/05/2019   Pancreatic cyst 10/05/2019   GERD (gastroesophageal reflux disease) 10/12/2013   Major depressive disorder, single episode, unspecified 10/12/2013    Allergies  Allergen Reactions   Azithromycin Other (See Comments)    SWELLING/EDEMA   Paroxetine Hcl Nausea Only and Other (See Comments)    GI Upset   Ciprofloxacin Itching    Phlebitis    Sodium Clavulanate Diarrhea   Codeine Nausea And Vomiting    Past Surgical History:  Procedure Laterality Date   ACHILLES TENDON REPAIR  2008   ARTHRODESIS METATARSALPHALANGEAL JOINT (MTPJ) Left 10/18/2020   Procedure: ARTHRODESIS METATARSALPHALANGEAL JOINT (MTPJ);  Surgeon: Samara Deist, DPM;  Location: [place];  Service: Podiatry;  Laterality: Left;  Anesthesia- General with local   BIOPSY  10/21/2019   Procedure: BIOPSY;  Surgeon: Milus Banister, MD;  Location: [facility];  Service: Endoscopy;;   BOTOX INJECTION N/A 06/26/2020   Procedure: BOTOX INJECTION into internal anal sphincter muscles;  Surgeon: Fredirick Maudlin, MD;  Location: [facility];  Service: General;  Laterality: N/A;  Provider requesting 30 minutes for procedure   BREAST CYST ASPIRATION Right 2010   CATARACT EXTRACTION W/PHACO Right 12/21/2019   Procedure: CATARACT EXTRACTION PHACO AND INTRAOCULAR LENS PLACEMENT ([place]) RIGHT 4.15 00:31.2;  Surgeon: Birder Robson, MD;  Location: [place];  Service: Ophthalmology;  Laterality: Right;   CATARACT EXTRACTION W/PHACO Left 01/11/2020   Procedure: CATARACT  EXTRACTION PHACO AND INTRAOCULAR LENS PLACEMENT ([facility]) LEFT 4.78 00:26.5;  Surgeon: Birder Robson, MD;  Location: [place];  Service: Ophthalmology;  Laterality: Left;   COLONOSCOPY WITH PROPOFOL N/A 09/13/2019   Procedure: COLONOSCOPY WITH PROPOFOL;  Surgeon: Jonathon Bellows, MD;  Location: [facility];  Service: Gastroenterology;  Laterality: N/A;   ESOPHAGOGASTRODUODENOSCOPY (EGD) WITH PROPOFOL N/A 10/21/2019   Procedure: ESOPHAGOGASTRODUODENOSCOPY (EGD) WITH PROPOFOL;  Surgeon: Milus Banister, MD;  Location: [facility];  Service: Endoscopy;  Laterality: N/A;   EUS N/A 10/21/2019   Procedure: UPPER ENDOSCOPIC ULTRASOUND (EUS) RADIAL;  Surgeon: Milus Banister, MD;  Location: [facility];  Service: Endoscopy;  Laterality: N/A;   FINE NEEDLE ASPIRATION N/A 10/21/2019   Procedure: FINE NEEDLE ASPIRATION (FNA) LINEAR;  Surgeon: Milus Banister, MD;  Location[facility];  Service: Endoscopy;  Laterality: N/A;   HEMORRHOID SURGERY N/A 06/26/2020   Procedure: HEMORRHOIDECTOMY;  Surgeon: Fredirick Maudlin, MD;  Location: [facility];  Service: General;  Laterality: N/A;    Social History   Tobacco Use   Smoking status: Former    Packs/day: 0.50    Years: 10.00    Pack years: 5.00    Types: Cigarettes    Quit date: 2002    Years since quitting: 21.0   Smokeless tobacco: Never  Vaping Use   Vaping Use: Never used  Substance Use Topics   Alcohol use: Yes  Comment: socially   Drug use: Never     Medication list has been reviewed and updated.  Current Meds  Medication Sig   atorvastatin (LIPITOR) 10 MG tablet Take 1 tablet (10 mg total) by mouth daily.   BIOTIN PO Take 1,000 mg by mouth daily.   Cholecalciferol (VITAMIN D-3) 125 MCG (5000 UT) TABS Take 125 mcg by mouth daily.   citalopram (CELEXA) 20 MG tablet Take 1 tablet (20 mg total) by mouth daily.   folic acid (FOLVITE) 026 MCG tablet Take 800 mcg by mouth daily.   Lactobacillus (ACIDOPHILUS) CAPS capsule Take 1  capsule by mouth daily.   omeprazole (PRILOSEC) 20 MG capsule Take 1 capsule (20 mg total) by mouth daily.   traZODone (DESYREL) 100 MG tablet Take 1 tablet (100 mg total) by mouth at bedtime as needed. for sleep   [DISCONTINUED] fluticasone furoate-vilanterol (BREO ELLIPTA) 100-25 MCG/INH AEPB Inhale 1 puff into the lungs daily. (Patient taking differently: Inhale 1 puff into the lungs as needed.)    PHQ 2/9 Scores 04/25/2021 10/11/2020 10/11/2020 07/26/2020  PHQ - 2 Score 0 0 0 0  PHQ- 9 Score 2 5 0 1    GAD 7 : Generalized Anxiety Score 04/25/2021 10/11/2020 07/26/2020 05/03/2020  Nervous, Anxious, on Edge 0 1 0 0  Control/stop worrying 0 0 0 0  Worry too much - different things 0 1 0 0  Trouble relaxing 0 1 0 0  Restless 0 0 0 0  Easily annoyed or irritable 0 1 0 0  Afraid - awful might happen 0 0 0 0  Total GAD 7 Score 0 4 0 0  Anxiety Difficulty Not difficult at all Not difficult at all - Not difficult at all    BP Readings from Last 3 Encounters:  04/25/21 122/60  11/11/20 122/70  10/18/20 (!) 146/65    Physical Exam Vitals and nursing note reviewed.  Constitutional:      General: She is not in acute distress.    Appearance: She is well-developed.  HENT:     Head: Normocephalic and atraumatic.     Right Ear: Tympanic membrane and ear canal normal.     Left Ear: Tympanic membrane and ear canal normal.     Nose:     Right Sinus: No maxillary sinus tenderness.     Left Sinus: No maxillary sinus tenderness.  Eyes:     General: No scleral icterus.       Right eye: No discharge.        Left eye: No discharge.     Conjunctiva/sclera: Conjunctivae normal.  Neck:     Thyroid: No thyromegaly.     Vascular: No carotid bruit.  Cardiovascular:     Rate and Rhythm: Normal rate and regular rhythm.     Pulses: Normal pulses.     Heart sounds: Normal heart sounds.  Pulmonary:     Effort: Pulmonary effort is normal. No respiratory distress.     Breath sounds: Normal breath sounds  and air entry. No wheezing.  Chest:     Chest wall: No mass, swelling or tenderness.  Breasts:    Right: No mass, nipple discharge, skin change or tenderness.     Left: No mass, nipple discharge, skin change or tenderness.  Abdominal:     General: Bowel sounds are normal.     Palpations: Abdomen is soft.     Tenderness: There is no abdominal tenderness.  Musculoskeletal:     Cervical back: Normal range  of motion. No erythema.     Right lower leg: No edema.     Left lower leg: No edema.  Lymphadenopathy:     Cervical: No cervical adenopathy.  Skin:    General: Skin is warm and dry.     Capillary Refill: Capillary refill takes less than 2 seconds.     Findings: No rash.  Neurological:     General: No focal deficit present.     Mental Status: She is alert and oriented to person, place, and time.     Cranial Nerves: No cranial nerve deficit.     Sensory: No sensory deficit.     Deep Tendon Reflexes: Reflexes are normal and symmetric.  Psychiatric:        Attention and Perception: Attention normal.        Mood and Affect: Mood normal.    Wt Readings from Last 3 Encounters:  04/25/21 165 lb 9.6 oz (75.1 kg)  10/18/20 159 lb (72.1 kg)  10/11/20 161 lb (73 kg)    BP 122/60    Pulse 64    Ht 5\' 6"  (1.676 m)    Wt 165 lb 9.6 oz (75.1 kg)    SpO2 95%    BMI 26.73 kg/m   Assessment and Plan: 1. Annual physical exam Normal exam. Vaccines updated today.  2. Encounter for screening mammogram for breast cancer Schedule at [facility]  3. Encounter for screening for osteoporosis Schedule at [facility]  4. Need for hepatitis C screening test - Hepatitis C antibody  5. Gastroesophageal reflux disease, unspecified whether esophagitis present Likely contributing to epigastric and right chest discomfort. No red flag s/s. Will increase PPI to bid - if no improvement, refer back to GI. - CBC with Differential/Platelet - omeprazole (PRILOSEC) 20 MG capsule; Take 1 capsule (20  mg total) by mouth 2 (two) times daily before a meal.  Dispense: 180 capsule; Refill: 1  6. Major depressive disorder with single episode, in full remission ([place]) Clinically stable on current regimen with good control of symptoms, No SI or HI. Will continue current therapy. - TSH  7. Aortic atherosclerosis (HCC) On statin therapy without known side effects. - Comprehensive metabolic panel - Lipid panel  8. Need for vaccination for pneumococcus - Pneumococcal conjugate vaccine 20-valent  9. Need for influenza vaccination - Flu Vaccine QUAD High Dose(Fluad)  10. Shortness of breath Continue BREO Refer to Pulmonary for evaluation of etiology - fluticasone furoate-vilanterol (BREO ELLIPTA) 100-25 MCG/ACT AEPB; Inhale 1 puff into the lungs daily.  Dispense: 1 each; Refill: 11 - Ambulatory referral to Pulmonology   Partially dictated using Dragon software. Any errors are unintentional.  Halina Maidens, MD [facility]  04/25/2021

## 2021-04-26 LAB — CBC WITH DIFFERENTIAL/PLATELET
Basophils Absolute: 0.1 10*3/uL (ref 0.0–0.2)
Basos: 1 %
EOS (ABSOLUTE): 0.1 10*3/uL (ref 0.0–0.4)
Eos: 2 %
Hematocrit: 40.4 % (ref 34.0–46.6)
Hemoglobin: 13 g/dL (ref 11.1–15.9)
Immature Grans (Abs): 0 10*3/uL (ref 0.0–0.1)
Immature Granulocytes: 0 %
Lymphocytes Absolute: 1.4 10*3/uL (ref 0.7–3.1)
Lymphs: 26 %
MCH: 29.2 pg (ref 26.6–33.0)
MCHC: 32.2 g/dL (ref 31.5–35.7)
MCV: 91 fL (ref 79–97)
Monocytes Absolute: 0.5 10*3/uL (ref 0.1–0.9)
Monocytes: 10 %
Neutrophils Absolute: 3.3 10*3/uL (ref 1.4–7.0)
Neutrophils: 61 %
Platelets: 184 10*3/uL (ref 150–450)
RBC: 4.45 x10E6/uL (ref 3.77–5.28)
RDW: 13.3 % (ref 11.7–15.4)
WBC: 5.3 10*3/uL (ref 3.4–10.8)

## 2021-04-26 LAB — COMPREHENSIVE METABOLIC PANEL
ALT: 14 IU/L (ref 0–32)
AST: 25 IU/L (ref 0–40)
Albumin/Globulin Ratio: 1.6 (ref 1.2–2.2)
Albumin: 4.6 g/dL (ref 3.8–4.8)
Alkaline Phosphatase: 83 IU/L (ref 44–121)
BUN/Creatinine Ratio: 15 (ref 12–28)
BUN: 14 mg/dL (ref 8–27)
Bilirubin Total: 0.5 mg/dL (ref 0.0–1.2)
CO2: 24 mmol/L (ref 20–29)
Calcium: 9.7 mg/dL (ref 8.7–10.3)
Chloride: 103 mmol/L (ref 96–106)
Creatinine, Ser: 0.94 mg/dL (ref 0.57–1.00)
Globulin, Total: 2.8 g/dL (ref 1.5–4.5)
Glucose: 95 mg/dL (ref 70–99)
Potassium: 4.6 mmol/L (ref 3.5–5.2)
Sodium: 140 mmol/L (ref 134–144)
Total Protein: 7.4 g/dL (ref 6.0–8.5)
eGFR: 65 mL/min/{1.73_m2} (ref >59)

## 2021-04-26 LAB — TSH: TSH: 4.9 u[IU]/mL — ABNORMAL HIGH (ref 0.450–4.500)

## 2021-04-26 LAB — LIPID PANEL
Chol/HDL Ratio: 1.6 ratio (ref 0.0–4.4)
Cholesterol, Total: 209 mg/dL — ABNORMAL HIGH (ref 100–199)
HDL: 131 mg/dL (ref >39)
LDL Chol Calc (NIH): 63 mg/dL (ref 0–99)
Triglycerides: 91 mg/dL (ref 0–149)
VLDL Cholesterol Cal: 15 mg/dL (ref 5–40)

## 2021-04-26 LAB — HEPATITIS C ANTIBODY: Hep C Virus Ab: <0.1 s/co ratio (ref 0.0–0.9)

## 2021-05-22 ENCOUNTER — Encounter: Payer: Self-pay | Admitting: Surgery

## 2021-05-22 ENCOUNTER — Other Ambulatory Visit: Payer: Self-pay

## 2021-05-22 ENCOUNTER — Ambulatory Visit (INDEPENDENT_AMBULATORY_CARE_PROVIDER_SITE_OTHER): Payer: MEDICARE | Admitting: Surgery

## 2021-05-22 VITALS — BP 143/84 | HR 75 | Temp 98.2°F | Ht 66.0 in | Wt 167.2 lb

## 2021-05-22 DIAGNOSIS — N816 Rectocele: Secondary | ICD-10-CM | POA: Diagnosis not present

## 2021-05-22 DIAGNOSIS — K5900 Constipation, unspecified: Secondary | ICD-10-CM | POA: Diagnosis not present

## 2021-05-22 NOTE — Progress Notes (Signed)
Patient ID: Claudia Gutierrez, female   DOB: Dec 10, 1950, 71 y.o.   MRN: [id number]  Chief Complaint: Some bleeding with bowel movements  History of Present Illness Claudia Gutierrez is a 71 y.o. female with a year since her Botox sphincterotomy for anal fissure.  She reports she continues to have hard stools, and takes MiraLAX for relief.  She reports some bleeding during bowel movements with some pain, moving typically once daily.  She denies utilizing any routine or proactive bowel management, she admits having taken some fiber in the past but never stuck with it.  She denies any bleeding between bowel movements.  But has pain with the hard stools.  Past Medical History Past Medical History:  Diagnosis Date   Acid reflux    Anxiety    Complication of anesthesia    ponv   History of kidney stones    PONV (postoperative nausea and vomiting)    2002 colonoscopy   Wears dentures    Full upper      Past Surgical History:  Procedure Laterality Date   ACHILLES TENDON REPAIR  2008   ARTHRODESIS METATARSALPHALANGEAL JOINT (MTPJ) Left 10/18/2020   Procedure: ARTHRODESIS METATARSALPHALANGEAL JOINT (MTPJ);  Surgeon: Samara Deist, DPM;  Location: [facility];  Service: Podiatry;  Laterality: Left;  Anesthesia- General with local   BIOPSY  10/21/2019   Procedure: BIOPSY;  Surgeon: Milus Banister, MD;  Location: [facility];  Service: Endoscopy;;   BOTOX INJECTION N/A 06/26/2020   Procedure: BOTOX INJECTION into internal anal sphincter muscles;  Surgeon: Fredirick Maudlin, MD;  Location: [facility];  Service: General;  Laterality: N/A;  Provider requesting 30 minutes for procedure   BREAST CYST ASPIRATION Right 2010   CATARACT EXTRACTION W/PHACO Right 12/21/2019   Procedure: CATARACT EXTRACTION PHACO AND INTRAOCULAR LENS PLACEMENT ([place]) RIGHT 4.15 00:31.2;  Surgeon: Birder Robson, MD;  Location: [facility];  Service: Ophthalmology;  Laterality: Right;   CATARACT EXTRACTION W/PHACO  Left 01/11/2020   Procedure: CATARACT EXTRACTION PHACO AND INTRAOCULAR LENS PLACEMENT ([facility]) LEFT 4.78 00:26.5;  Surgeon: Birder Robson, MD;  Location: [facility];  Service: Ophthalmology;  Laterality: Left;   COLONOSCOPY WITH PROPOFOL N/A 09/13/2019   Procedure: COLONOSCOPY WITH PROPOFOL;  Surgeon: Jonathon Bellows, MD;  Location: [facility];  Service: Gastroenterology;  Laterality: N/A;   ESOPHAGOGASTRODUODENOSCOPY (EGD) WITH PROPOFOL N/A 10/21/2019   Procedure: ESOPHAGOGASTRODUODENOSCOPY (EGD) WITH PROPOFOL;  Surgeon: Milus Banister, MD;  Location: [facility];  Service: Endoscopy;  Laterality: N/A;   EUS N/A 10/21/2019   Procedure: UPPER ENDOSCOPIC ULTRASOUND (EUS) RADIAL;  Surgeon: Milus Banister, MD;  Location: [facility];  Service: Endoscopy;  Laterality: N/A;   FINE NEEDLE ASPIRATION N/A 10/21/2019   Procedure: FINE NEEDLE ASPIRATION (FNA) LINEAR;  Surgeon: Milus Banister, MD;  Location: [facility];  Service: Endoscopy;  Laterality: N/A;   HEMORRHOID SURGERY N/A 06/26/2020   Procedure: HEMORRHOIDECTOMY;  Surgeon: Fredirick Maudlin, MD;  Location: [facility];  Service: General;  Laterality: N/A;    Allergies  Allergen Reactions   Azithromycin Other (See Comments)    SWELLING/EDEMA   Paroxetine Hcl Nausea Only and Other (See Comments)    GI Upset   Ciprofloxacin Itching    Phlebitis    Sodium Clavulanate Diarrhea   Codeine Nausea And Vomiting    Current Outpatient Medications  Medication Sig Dispense Refill   atorvastatin (LIPITOR) 10 MG tablet Take 1 tablet (10 mg total) by mouth daily. 90 tablet 1   BIOTIN PO Take  1,000 mg by mouth daily.     Cholecalciferol (VITAMIN D-3) 125 MCG (5000 UT) TABS Take 125 mcg by mouth daily.     citalopram (CELEXA) 20 MG tablet Take 1 tablet (20 mg total) by mouth daily. 90 tablet 1   fluticasone furoate-vilanterol (BREO ELLIPTA) 100-25 MCG/ACT AEPB Inhale 1 puff into the lungs daily. 1 each 11   folic acid (FOLVITE) 950 MCG  tablet Take 800 mcg by mouth daily.     Lactobacillus (ACIDOPHILUS) CAPS capsule Take 1 capsule by mouth daily.     Omega-3 Fatty Acids (FISH OIL PO) Take 1 capsule by mouth daily.     omeprazole (PRILOSEC) 20 MG capsule Take 1 capsule (20 mg total) by mouth 2 (two) times daily before a meal. 180 capsule 1   traZODone (DESYREL) 100 MG tablet Take 1 tablet (100 mg total) by mouth at bedtime as needed. for sleep 90 tablet 1   No current facility-administered medications for this visit.    Family History Family History  Problem Relation Age of Onset   Breast cancer Sister 4       and stomach ca   Diabetes Sister    Colon cancer Sister 59   Lung cancer Father    Diabetes Father    Heart disease Father    Hypertension Father    Stroke Paternal Grandmother       Social History Social History   Tobacco Use   Smoking status: Former    Packs/day: 0.50    Years: 10.00    Pack years: 5.00    Types: Cigarettes    Quit date: 2002    Years since quitting: 21.1   Smokeless tobacco: Never  Vaping Use   Vaping Use: Never used  Substance Use Topics   Alcohol use: Yes    Comment: socially   Drug use: Never        Review of Systems  Constitutional: Negative.   HENT: Negative.    Eyes:  Positive for blurred vision.  Respiratory:  Positive for wheezing.   Cardiovascular: Negative.   Gastrointestinal:  Positive for abdominal pain and blood in stool.  Genitourinary: Negative.   Skin: Negative.   Neurological: Negative.   Psychiatric/Behavioral: Negative.       Physical Exam Blood pressure (!) 143/84, pulse 75, temperature 98.2 F (36.8 C), temperature source Oral, height 5\' 6"  (1.676 m), weight 167 lb 3.2 oz (75.8 kg), SpO2 96 %. Last Weight  Most recent update: 05/22/2021 10:57 AM    Weight  75.8 kg (167 lb 3.2 oz)             CONSTITUTIONAL: Well developed, and nourished, appropriately responsive and aware without distress.   EYES: Sclera non-icteric.   EARS, NOSE,  MOUTH AND THROAT: Mask worn.    Hearing is intact to voice.  NECK: Trachea is midline, and there is no jugular venous distension.  LYMPH NODES:  Lymph nodes in the neck are not enlarged. RESPIRATORY:  Lungs are clear, and breath sounds are equal bilaterally. Normal respiratory effort without pathologic use of accessory muscles. CARDIOVASCULAR: Heart is regular in rate and rhythm. GI: The abdomen is  soft, nontender, and nondistended. There were no palpable masses.  GU: Freda Munro is present as chaperone.  DRE: No remarkable sentinel tag, no appreciable fissure on exam with minimal evidence of discomfort.  She has a readily palpable step-off anteriorly consistent with a rectocele.  No remarkable hemorrhoidal disease, and no appreciable bright red blood, or fissure  present. MUSCULOSKELETAL:  Symmetrical muscle tone appreciated in all four extremities.    SKIN: Skin turgor is normal. No pathologic skin lesions appreciated.  NEUROLOGIC:  Motor and sensation appear grossly normal.  Cranial nerves are grossly without defect. PSYCH:  Alert and oriented to person, place and time. Affect is appropriate for situation.  Data Reviewed I have personally reviewed what is currently available of the patient's imaging, recent labs and medical records.   Labs:  CBC Latest Ref Rng & Units 04/25/2021 11/11/2020 06/23/2020  WBC 3.4 - 10.8 x10E3/uL 5.3 4.3 4.0  Hemoglobin 11.1 - 15.9 g/dL 13.0 12.0 12.4  Hematocrit 34.0 - 46.6 % 40.4 37.4 38.8  Platelets 150 - 450 x10E3/uL 184 119(L) 171   CMP Latest Ref Rng & Units 04/25/2021 11/11/2020 06/23/2020  Glucose 70 - 99 mg/dL 95 107(H) 110(H)  BUN 8 - 27 mg/dL 14 9 14   Creatinine 0.57 - 1.00 mg/dL 0.94 0.80 0.80  Sodium 134 - 144 mmol/L 140 141 140  Potassium 3.5 - 5.2 mmol/L 4.6 3.8 4.0  Chloride 96 - 106 mmol/L 103 107 106  CO2 20 - 29 mmol/L 24 26 26   Calcium 8.7 - 10.3 mg/dL 9.7 8.7(L) 9.3  Total Protein 6.0 - 8.5 g/dL 7.4 - -  Total Bilirubin 0.0 - 1.2 mg/dL 0.5 - -   Alkaline Phos 44 - 121 IU/L 83 - -  AST 0 - 40 IU/L 25 - -  ALT 0 - 32 IU/L 14 - -      Imaging:  Within last 24 hrs: No results found.  Assessment    Hard stools/constipation. Rectocele likely making hard stool with movement more challenging and painful. Patient Active Problem List   Diagnosis Date Noted   Dermatochalasis of both upper eyelids 07/06/2020   Ptosis of both eyebrows 07/06/2020   Anal fissure 05/23/2020   Aortic atherosclerosis ([place]) 04/16/2020   Sacro-iliac pain 04/14/2020   Palpitation 04/14/2020   History of colon polyps 10/05/2019   Pancreatic cyst 10/05/2019   GERD (gastroesophageal reflux disease) 10/12/2013   Major depressive disorder, single episode, unspecified 10/12/2013    Plan    Advised to pursue a goal of 25 to 30 g of fiber daily.  Made aware that the majority of this may be through natural sources, but advised to be aware of actual consumption and to ensure minimal consumption by daily supplementation.  Various forms of supplements discussed.  Recommended Psyllium husk, that mixes well with applesauce, or the powder which goes down well shaken with chocolate milk.  Strongly advised to consume more fluids to ensure adequate hydration, instructed to watch color of urine to determine adequacy of hydration.  Clarity is pursued in urine output, and bowel activity that correlates to significant meal intake.   We need to avoid deferring having bowel movements, advised to take the time at the first sign of sensation, typically following meals, and in the morning.   Subsequent utilization of MiraLAX may be needed ensure at least daily movement, ideally twice daily bowel movements.  If multiple doses of MiraLAX are necessary utilize them. Never skip a day...  To be regular, we must do the above EVERY day.    Face-to-face time spent with the patient and accompanying care providers(if present) was 30 minutes, with more than 50% of the time spent counseling,  educating, and coordinating care of the patient.    These notes generated with voice recognition software. I apologize for typographical errors.  Ronny Bacon M.D., FACS 05/22/2021,  11:03 AM

## 2021-05-22 NOTE — Patient Instructions (Addendum)
Please call our office if you have any questions or concerns.   Advised to pursue a goal of 25 to 30 g of fiber daily.  Made aware that the majority of this may be through natural sources, but advised to be aware of actual consumption and to ensure minimal consumption by daily supplementation.  Various forms of supplements discussed.  Recommended Psyllium husk, that mixes well with applesauce, or the powder which goes down well shaken with chocolate milk.  Strongly advised to consume more fluids to ensure adequate hydration, instructed to watch color of urine to determine adequacy of hydration.  Clarity is pursued in urine output, and bowel activity that correlates to significant meal intake.   We need to avoid deferring having bowel movements, advised to take the time at the first sign of sensation, typically following meals, and in the morning.   Subsequent utilization of MiraLAX may be needed ensure at least daily movement, ideally twice daily bowel movements.  If multiple doses of MiraLAX are necessary utilize them. Never skip a day...  To be regular, we must do the above EVERY day.   Anal Fissure, Adult An anal fissure is a small tear or crack in the tissue of the anus. Bleeding from a fissure usually stops on its own within a few minutes. However, bleeding will often occur again with each bowel movement until the fissure heals. What are the causes? This condition is usually caused by passing a large or hard stool (feces). Other causes include: Constipation. Frequent diarrhea. Inflammatory bowel disease (Crohn's disease or ulcerative colitis). Childbirth. Infections. Anal sex. What are the signs or symptoms? Symptoms of this condition include: Bleeding from the rectum. Small amounts of blood seen on your stool, on the toilet paper, or in the toilet after a bowel movement. The blood coats the outside of the stool and is not mixed with the stool. Painful bowel movements. Itching or  irritation around the anus. How is this diagnosed? A health care provider may diagnose this condition by closely examining the anal area. An anal fissure can usually be seen with careful inspection. In some cases, a rectal exam may be performed, or a short tube (anoscope) may be used to examine the anal canal. How is this treated? Initial treatment for this condition may include: Taking steps to avoid constipation. This may include making changes to your diet, such as increasing your intake of fiber or fluid. Taking fiber supplements. These supplements can soften your stool to help make bowel movements easier. Your health care provider may also prescribe a stool softener if your stool is hard. Taking sitz baths. This may help to heal the tear. Using medicated creams or ointments. These may be prescribed to lessen discomfort. Treatments that are sometimes used if initial treatments do not work well or if the condition is more severe may include: Botulinum injection. Surgery to repair the fissure. Follow these instructions at home: Eating and drinking  Avoid foods that may cause constipation, such as bananas, milk, and other dairy products. Eat all fruits, except bananas. Drink enough fluid to keep your urine pale yellow. Eat foods that are high in fiber, such as beans, whole grains, and fresh fruits and vegetables. General instructions  Take over-the-counter and prescription medicines only as told by your health care provider. Use creams or ointments only as told by your health care provider. Keep the anal area clean and dry. Take sitz baths as told by your health care provider. Do not use soap in  the sitz baths. Keep all follow-up visits as told by your health care provider. This is important. Contact a health care provider if you have: More bleeding. A fever. Diarrhea that is mixed with blood. Pain that continues. Ongoing problems that are getting worse rather than  better. Summary An anal fissure is a small tear or crack in the tissue of the anus. This condition is usually caused by passing a large or hard stool (feces). Other causes include constipation and frequent diarrhea. Initial treatment for this condition may include taking steps to avoid constipation, such as increasing your intake of fiber or fluid. Follow instructions for care as told by your health care provider. Contact your health care provider if you have more bleeding or your problem is getting worse rather than better. Keep all follow-up visits as told by your health care provider. This is important. This information is not intended to replace advice given to you by your health care provider. Make sure you discuss any questions you have with your health care provider. Document Revised: 10/12/2020 Document Reviewed: 10/12/2020 Elsevier Patient Education  [place].

## 2021-06-04 DIAGNOSIS — Z961 Presence of intraocular lens: Secondary | ICD-10-CM | POA: Diagnosis not present

## 2021-06-18 ENCOUNTER — Ambulatory Visit
Admission: RE | Admit: 2021-06-18 | Discharge: 2021-06-18 | Disposition: A | Discharge status: Home / Self Care | Payer: Medicare HMO | Source: Ambulatory Visit | Attending: Adult Health | Admitting: Adult Health

## 2021-06-18 ENCOUNTER — Ambulatory Visit
Admission: RE | Admit: 2021-06-18 | Discharge: 2021-06-18 | Disposition: A | Discharge status: Home / Self Care | Payer: Medicare HMO | Attending: Adult Health | Admitting: Adult Health

## 2021-06-18 ENCOUNTER — Encounter: Payer: Self-pay | Admitting: Adult Health

## 2021-06-18 ENCOUNTER — Ambulatory Visit (INDEPENDENT_AMBULATORY_CARE_PROVIDER_SITE_OTHER): Payer: Medicare HMO | Admitting: Adult Health

## 2021-06-18 ENCOUNTER — Other Ambulatory Visit: Payer: Self-pay

## 2021-06-18 VITALS — BP 120/82 | HR 68 | Temp 97.1°F | Ht 66.0 in | Wt 166.6 lb

## 2021-06-18 DIAGNOSIS — G4719 Other hypersomnia: Secondary | ICD-10-CM

## 2021-06-18 DIAGNOSIS — R0602 Shortness of breath: Secondary | ICD-10-CM

## 2021-06-18 DIAGNOSIS — I272 Pulmonary hypertension, unspecified: Secondary | ICD-10-CM | POA: Diagnosis not present

## 2021-06-18 MED ORDER — ALBUTEROL SULFATE HFA 108 (90 BASE) MCG/ACT IN AERS: 1.0000 | INHALATION_SPRAY | Freq: Four times a day (QID) | RESPIRATORY_TRACT | 2 refills | Status: DC | PRN

## 2021-06-18 NOTE — Progress Notes (Signed)
Agree with the details of the visit as noted by Tammy Parrett, NP.  C. Laura Jashayla Glatfelter, MD [facility] 

## 2021-06-18 NOTE — Progress Notes (Signed)
? ?'@Patient'$  ID: Claudia Gutierrez, female    DOB: Oct 15, 1950, 71 y.o.   MRN: [id number] ? ?Chief Complaint  ?Patient presents with  ? Follow-up  ? ? ?Referring provider: ?Glean Hess, MD ? ?HPI: ?71 yo female former smoker seen for pulmonary consult May 29, 2020 for shortness of breath and wheezing for 2 years. ? ?TEST/EVENTS :  ?2D echo June 19, 2020 EF at 55 to 60%, right ventricular systolic function normal, RV size normal.  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 ?06/18/2021 Follow up: Wheezing/shortness of breath  ?Patient returns for a follow-up visit.  Patient was seen for pulmonary consult May 29, 2020 for shortness of breath and wheezing x2 years.  Patient was set up for pulmonary function testing that was not completed.  She was also set up for 2D echo that showed normal EF and RV size.  Elevated pulmonary artery pressures at 42 mmHg.  And mild to moderate tricuspid valve regurg.  Unfortunately patient did not follow-up.  Patient says she is unclear why she did not follow-up.  She says she continues to have ongoing shortness of breath and wheezing.  She was recently put on Breo inhaler which has helped some but continues to have wheezing which is worse at nighttime.  She gets short of breath with most any activity is unable to exercise due to this.  She is able to do her housework shopping and drives.  But gets winded easily.  Intermittent dry cough. No drainage. She has some intermittent chest tightness with activities.  This has been going on for more than 3 years now.  She denies any palpitations, syncope, hemoptysis, postnasal drainage.  Patient does have reflux despite taking Prilosec daily.  She was recently placed on Prilosec twice a day.  Unsure if this is helping yet or not.  Patient has a strong family history of coronary artery disease and A-fib. ?Her medical history significant for hyperlipidemia.  She is a former smoker.   No previous diagnosis of asthma COPD or emphysema previous chest x-ray August 2022 showed clear lungs ?Patient does have daytime sleepiness, fatigue low energy feels that she is tired all the time no matter what she does,.  Patient does have snoring.  Drinks rare caffeine.  Has to take trazodone to help with her insomnia. ?Denies calf pain, swelling, hemoptysis.  O2 saturations are 97% on room air. ? ? ? ? ?Allergies  ?Allergen Reactions  ? Azithromycin Other (See Comments)  ?  SWELLING/EDEMA  ? Paroxetine Hcl Nausea Only and Other (See Comments)  ?  GI Upset  ? Ciprofloxacin Itching  ?  Phlebitis   ? Sodium Clavulanate Diarrhea  ? Codeine Nausea And Vomiting  ? ? ?Immunization History  ?Administered Date(s) Administered  ? Fluad Quad(high Dose 65+) 02/07/2020  ? Influenza, Seasonal, Injecte, Preservative Fre 01/13/2007, 01/08/2010, 01/13/2013  ? Influenza,inj,Quad PF,6+ Mos 02/28/2017, 04/25/2021  ? Influenza-Unspecified 12/30/2013  ? Moderna Sars-Covid-2 Vaccination 05/15/2019, 06/11/2019  ? PNEUMOCOCCAL CONJUGATE-20 04/25/2021  ? Pneumococcal Conjugate-13 10/05/2019  ? ? ?Past Medical History:  ?Diagnosis Date  ? Acid reflux   ? Anxiety   ? Complication of anesthesia   ? ponv  ? History of kidney stones   ? PONV (postoperative nausea and vomiting)   ? 2002 colonoscopy  ? Wears dentures   ? Full upper  ? ? ?Tobacco History: ?Social History  ? ?Tobacco Use  ?Smoking Status Former  ? Packs/day:  0.50  ? Years: 10.00  ? Pack years: 5.00  ? Types: Cigarettes  ? Quit date: 2002  ? Years since quitting: 21.2  ?Smokeless Tobacco Never  ? ?Counseling given: Not Answered ? ? ?Outpatient Medications Prior to Visit  ?Medication Sig Dispense Refill  ? atorvastatin (LIPITOR) 10 MG tablet Take 1 tablet (10 mg total) by mouth daily. 90 tablet 1  ? BIOTIN PO Take 1,000 mg by mouth daily.    ? Cholecalciferol (VITAMIN D-3) 125 MCG (5000 UT) TABS Take 125 mcg by mouth daily.    ? citalopram (CELEXA) 20 MG tablet Take 1 tablet (20 mg  total) by mouth daily. 90 tablet 1  ? fluticasone furoate-vilanterol (BREO ELLIPTA) 100-25 MCG/ACT AEPB Inhale 1 puff into the lungs daily. 1 each 11  ? folic acid (FOLVITE) 203 MCG tablet Take 800 mcg by mouth daily.    ? Lactobacillus (ACIDOPHILUS) CAPS capsule Take 1 capsule by mouth daily.    ? Omega-3 Fatty Acids (FISH OIL PO) Take 1 capsule by mouth daily.    ? omeprazole (PRILOSEC) 20 MG capsule Take 1 capsule (20 mg total) by mouth 2 (two) times daily before a meal. 180 capsule 1  ? traZODone (DESYREL) 100 MG tablet Take 1 tablet (100 mg total) by mouth at bedtime as needed. for sleep 90 tablet 1  ? ?No facility-administered medications prior to visit.  ? ? ? ?Review of Systems:  ? ?Constitutional:   No  weight loss, night sweats,  Fevers, chills,  ?+fatigue, or  lassitude. ? ?HEENT:   No headaches,  Difficulty swallowing,  Tooth/dental problems, or  Sore throat,  ?              No sneezing, itching, ear ache, nasal congestion, post nasal drip,  ? ?CV:  No chest pain,  Orthopnea, PND, swelling in lower extremities, anasarca, dizziness, palpitations, syncope.  ? ?GI  No heartburn, indigestion, abdominal pain, nausea, vomiting, diarrhea, change in bowel habits, loss of appetite, bloody stools.  ? ?Resp:   No chest wall deformity ? ?Skin: no rash or lesions. ? ?GU: no dysuria, change in color of urine, no urgency or frequency.  No flank pain, no hematuria  ? ?MS:  No joint pain or swelling.  No decreased range of motion.  No back pain. ? ? ? ?Physical Exam ? ?BP 120/82 (BP Location: Left Arm, Patient Position: Sitting, Cuff Size: Normal)   Pulse 68   Temp (!) 97.1 ?F (36.2 ?C) (Oral)   Ht '5\' 6"'$  (1.676 m)   Wt 166 lb 9.6 oz (75.6 kg)   SpO2 97%   BMI 26.89 kg/m?  ? ?GEN: A/Ox3; pleasant , NAD, well nourished  ?  ?HEENT:  [place]/AT,   NOSE-clear, THROAT-clear, no lesions, no postnasal drip or exudate noted.  ?Class 2 MP airway  ? ?NECK:  Supple w/ fair ROM; no JVD; normal carotid impulses w/o bruits; no  thyromegaly or nodules palpated; no lymphadenopathy.   ? ?RESP  Clear  P & A; w/o, wheezes/ rales/ or rhonchi. no accessory muscle use, no dullness to percussion ? ?CARD:  RRR, no m/r/g, no peripheral edema, pulses intact, no cyanosis or clubbing. ? ?GI:   Soft & nt; nml bowel sounds; no organomegaly or masses detected.  ? ?Musco: Warm bil, no deformities or joint swelling noted.  ? ?Neuro: alert, no focal deficits noted.   ? ?Skin: Warm, no lesions or rashes ? ? ? ?Lab Results: ? ?CBC ?   ?Component  Value Date/Time  ? WBC 5.3 04/25/2021 1003  ? WBC 4.3 11/11/2020 1615  ? RBC 4.45 04/25/2021 1003  ? RBC 3.82 (L) 11/11/2020 1615  ? HGB 13.0 04/25/2021 1003  ? HCT 40.4 04/25/2021 1003  ? PLT 184 04/25/2021 1003  ? MCV 91 04/25/2021 1003  ? MCV 92 01/13/2014 1630  ? MCH 29.2 04/25/2021 1003  ? MCH 31.4 11/11/2020 1615  ? MCHC 32.2 04/25/2021 1003  ? MCHC 32.1 11/11/2020 1615  ? RDW 13.3 04/25/2021 1003  ? RDW 13.6 01/13/2014 1630  ? LYMPHSABS 1.4 04/25/2021 1003  ? EOSABS 0.1 04/25/2021 1003  ? BASOSABS 0.1 04/25/2021 1003  ? ? ?BMET ?   ?Component Value Date/Time  ? NA 140 04/25/2021 1003  ? NA 143 01/13/2014 1630  ? K 4.6 04/25/2021 1003  ? K 4.4 01/13/2014 1630  ? CL 103 04/25/2021 1003  ? CL 111 (H) 01/13/2014 1630  ? CO2 24 04/25/2021 1003  ? CO2 25 01/13/2014 1630  ? GLUCOSE 95 04/25/2021 1003  ? GLUCOSE 107 (H) 11/11/2020 1615  ? GLUCOSE 88 01/13/2014 1630  ? BUN 14 04/25/2021 1003  ? BUN 15 01/13/2014 1630  ? CREATININE 0.94 04/25/2021 1003  ? CREATININE 0.87 01/13/2014 1630  ? CALCIUM 9.7 04/25/2021 1003  ? CALCIUM 8.1 (L) 01/13/2014 1630  ? GFRNONAA >60 11/11/2020 1615  ? GFRNONAA >60 01/13/2014 1630  ? GFRAA 71 02/07/2020 1017  ? GFRAA >60 01/13/2014 1630  ? ? ?BNP ?No results found for: BNP ? ?ProBNP ?No results found for: PROBNP ? ?Imaging: ?No results found. ? ? ? ?No flowsheet data found. ? ?No results found for: NITRICOXIDE ? ? ? ? ? ?Assessment & Plan:  ? ?Dyspnea ?Chronic shortness of breath and  wheezing over the last 3 years questionable etiology.  We will check chest x-ray today.  Set up for pulmonary function testing. ?May have a component of underlying asthma.  We will continue on Breo.  We will add an a

## 2021-06-18 NOTE — Patient Instructions (Addendum)
Continue on BREO 1 puff daily , rinse after use.  ?Albuterol inhaler 1-2 puffs every 6hrs as needed  ?Chest xray today  ?Set up for PFTs  ? ?Home sleep study .  ?Healthy sleep regimen  ? ?Refer to Cardiology .  ? ?Follow up in 6 weeks and As needed   ?Please contact office for sooner follow up if symptoms do not improve or worsen or seek emergency care  ? ? ? ? ? ? ?

## 2021-06-18 NOTE — Assessment & Plan Note (Signed)
2D echo shows mildly elevated pulmonary hypertension.  RV size is normal.  She has no history of autoimmune or connective tissue disease or family history of autoimmune. ?She does have daytime sleepiness fatigue and snoring could have some underlying sleep apnea.  We will set up for a home sleep study.  No history of congestive heart failure or stroke. ? ?Plan  ?Patient Instructions  ?Continue on BREO 1 puff daily , rinse after use.  ?Albuterol inhaler 1-2 puffs every 6hrs as needed  ?Chest xray today  ?Set up for PFTs  ? ?Home sleep study .  ?Healthy sleep regimen  ? ?Refer to Cardiology .  ? ?Follow up in 6 weeks and As needed   ?Please contact office for sooner follow up if symptoms do not improve or worsen or seek emergency care  ? ? ? ? ? ? ?  ? ?

## 2021-06-18 NOTE — Assessment & Plan Note (Addendum)
Chronic shortness of breath and wheezing over the last 3 years questionable etiology.  We will check chest x-ray today.  Set up for pulmonary function testing. ?May have a component of underlying asthma.  We will continue on Breo.  We will add an albuterol. ?Previous echo showed underlying pulmonary hypertension and tricuspid valve regurg.  This could be contributing to her underlying shortness of breath.  She also has a strong family history of heart disease and A-fib. ?We will refer to cardiology for further evaluation. ?Low suspicion for PE as this has been going on for greater than 3 years.  O2 saturations are good.  RV size on echo is normal.  If symptoms persist could consider going forward with a CT chest for further evaluation of lung parenchyma.  ? ?Plan  ?Patient Instructions  ?Continue on BREO 1 puff daily , rinse after use.  ?Albuterol inhaler 1-2 puffs every 6hrs as needed  ?Chest xray today  ?Set up for PFTs  ? ?Home sleep study .  ?Healthy sleep regimen  ? ?Refer to Cardiology .  ? ?Follow up in 6 weeks and As needed   ?Please contact office for sooner follow up if symptoms do not improve or worsen or seek emergency care  ? ? ? ? ? ? ?  ? ?

## 2021-06-22 NOTE — Progress Notes (Signed)
LMOM for patient to call office back. Call back information provided.

## 2021-07-02 DIAGNOSIS — Z1231 Encounter for screening mammogram for malignant neoplasm of breast: Secondary | ICD-10-CM | POA: Diagnosis not present

## 2021-07-10 ENCOUNTER — Encounter: Payer: Self-pay | Admitting: Internal Medicine

## 2021-07-16 ENCOUNTER — Ambulatory Visit: Payer: Medicare HMO

## 2021-07-30 ENCOUNTER — Telehealth: Payer: Self-pay

## 2021-07-30 NOTE — Telephone Encounter (Signed)
Patient is scheduled for OV 07/31/2021 to discuss sleep study and PFT. These test have not been completed.  ? ?Lm for patient to reschedule OV unless she is having any new or worsened sx.  ? ?

## 2021-07-31 ENCOUNTER — Ambulatory Visit: Payer: Medicare HMO | Admitting: Adult Health

## 2021-07-31 NOTE — Telephone Encounter (Signed)
ATC patient x2--unable to leave vm due to mailbox being full. Will close encounter per office protocol.  

## 2021-08-23 ENCOUNTER — Other Ambulatory Visit: Payer: Self-pay | Admitting: Internal Medicine

## 2021-08-23 DIAGNOSIS — I700 Atherosclerosis of aorta: Secondary | ICD-10-CM

## 2021-08-24 ENCOUNTER — Other Ambulatory Visit: Payer: Self-pay | Admitting: Internal Medicine

## 2021-08-24 DIAGNOSIS — F325 Major depressive disorder, single episode, in full remission: Secondary | ICD-10-CM

## 2021-08-24 NOTE — Telephone Encounter (Signed)
Pt is calling back requesting follow up on medication refill.   Pt requesting a call back.

## 2021-08-28 NOTE — Telephone Encounter (Signed)
Requested Prescriptions  Pending Prescriptions Disp Refills  . atorvastatin (LIPITOR) 10 MG tablet [Pharmacy Med Name: Atorvastatin Calcium 10 MG Oral Tablet] 90 tablet 2    Sig: Take 1 tablet by mouth daily     Cardiovascular:  Antilipid - Statins Failed - 08/24/2021  1:49 PM      Failed - Lipid Panel in normal range within the last 12 months    Cholesterol, Total  Date Value Ref Range Status  04/25/2021 209 (H) 100 - 199 mg/dL Final   LDL Chol Calc (NIH)  Date Value Ref Range Status  04/25/2021 63 0 - 99 mg/dL Final   HDL  Date Value Ref Range Status  04/25/2021 131 >39 mg/dL Final   Triglycerides  Date Value Ref Range Status  04/25/2021 91 0 - 149 mg/dL Final         Passed - Patient is not pregnant      Passed - Valid encounter within last 12 months    Recent Outpatient Visits          4 months ago Annual physical exam   [facility] Glean Hess, MD   10 months ago Subacute sinusitis, unspecified location   [facility] Glean Hess, MD   1 year ago Major depressive disorder with single episode, in full remission [facility])   [facility] Glean Hess, MD   1 year ago Suspected COVID-19 virus infection   [facility] Glean Hess, MD   1 year ago [facility] Glean Hess, MD      Future Appointments            In 8 months Army Melia Jesse Sans, MD [facility], [facility]

## 2021-08-28 NOTE — Telephone Encounter (Signed)
Rx 03/27/21 #90 1RF- 6 month supply- too soon Requested Prescriptions  Pending Prescriptions Disp Refills  . traZODone (DESYREL) 100 MG tablet [Pharmacy Med Name: traZODone HCl 100 MG Oral Tablet] 90 tablet 0    Sig: TAKE 1 TABLET BY MOUTH AT BEDTIME AS NEEDED FOR SLEEP     Psychiatry: Antidepressants - Serotonin Modulator Passed - 08/24/2021  1:50 PM      Passed - Completed PHQ-2 or PHQ-9 in the last 360 days      Passed - Valid encounter within last 6 months    Recent Outpatient Visits          4 months ago Annual physical exam   [facility] Glean Hess, MD   10 months ago Subacute sinusitis, unspecified location   [facility] Glean Hess, MD   1 year ago Major depressive disorder with single episode, in full remission [facility])   [facility] Glean Hess, MD   1 year ago Suspected COVID-19 virus infection   [facility] Glean Hess, MD   1 year ago [facility] Glean Hess, MD      Future Appointments            In 8 months Army Melia Jesse Sans, MD [facility], [facility]

## 2021-08-29 ENCOUNTER — Other Ambulatory Visit: Payer: Self-pay | Admitting: Internal Medicine

## 2021-08-29 ENCOUNTER — Other Ambulatory Visit: Payer: Self-pay

## 2021-08-29 DIAGNOSIS — F325 Major depressive disorder, single episode, in full remission: Secondary | ICD-10-CM

## 2021-08-29 MED ORDER — TRAZODONE HCL 100 MG PO TABS: 100.0000 mg | ORAL_TABLET | Freq: Every evening | ORAL | 1 refills | Status: DC | PRN

## 2021-08-30 NOTE — Telephone Encounter (Signed)
Duplicate request. Requested Prescriptions  Pending Prescriptions Disp Refills  . traZODone (DESYREL) 100 MG tablet [Pharmacy Med Name: traZODone HCl 100 MG Oral Tablet] 90 tablet 0    Sig: TAKE 1 TABLET BY MOUTH AT BEDTIME AS NEEDED FOR SLEEP     Psychiatry: Antidepressants - Serotonin Modulator Passed - 08/29/2021  9:59 AM      Passed - Completed PHQ-2 or PHQ-9 in the last 360 days      Passed - Valid encounter within last 6 months    Recent Outpatient Visits          4 months ago Annual physical exam   [facility] Glean Hess, MD   10 months ago Subacute sinusitis, unspecified location   [facility] Glean Hess, MD   1 year ago Major depressive disorder with single episode, in full remission [facility])   [facility] Glean Hess, MD   1 year ago Suspected COVID-19 virus infection   [facility] Glean Hess, MD   1 year ago [facility] Glean Hess, MD      Future Appointments            In 8 months Army Melia Jesse Sans, MD [facility], [facility]

## 2021-09-04 ENCOUNTER — Telehealth: Payer: Self-pay | Admitting: Internal Medicine

## 2021-09-04 NOTE — Telephone Encounter (Signed)
Copied from [place]. Topic: Medicare AWV >> Sep 04, 2021 12:15 PM Cher Nakai R wrote: Reason for CRM:  Left message for patient to call back and schedule Medicare Annual Wellness Visit (AWV) in office.   If unable to come into the office for AWV,  please offer to do virtually or by telephone.  No hx of AWV eligible for AWVI per [employer] as of  05/30/2020  Please schedule at anytime with [facility] Health Advisor.      45 minute appointment   Any questions, please call me at [phone]

## 2021-10-10 ENCOUNTER — Other Ambulatory Visit: Payer: Self-pay | Admitting: Internal Medicine

## 2021-10-10 DIAGNOSIS — K219 Gastro-esophageal reflux disease without esophagitis: Secondary | ICD-10-CM

## 2021-10-10 IMAGING — US US ABDOMEN LIMITED
1 series · 14 of 25 positions shown · non-contrast
Comparison: CT from same day

CLINICAL DATA: Right upper quadrant abdominal pain x1 week

EXAM:
ULTRASOUND ABDOMEN LIMITED RIGHT UPPER QUADRANT

[Series 1: us abdomen limited ruq · 14 of 37 slices shown]
[im 1/37]
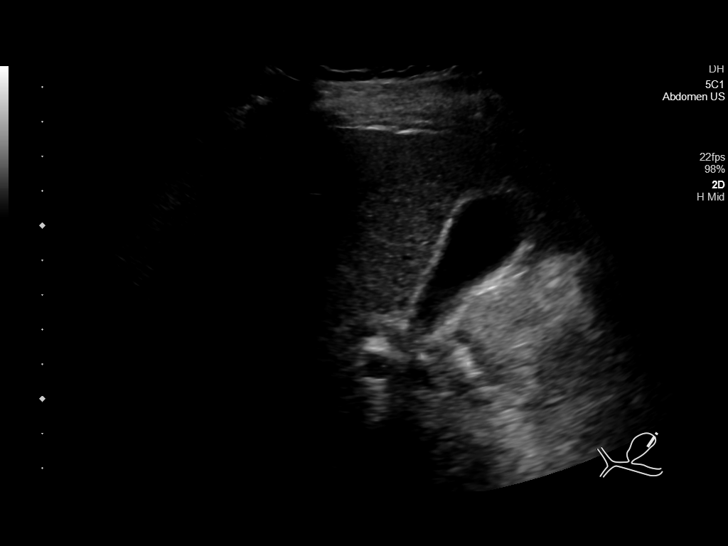
[im 4/37]
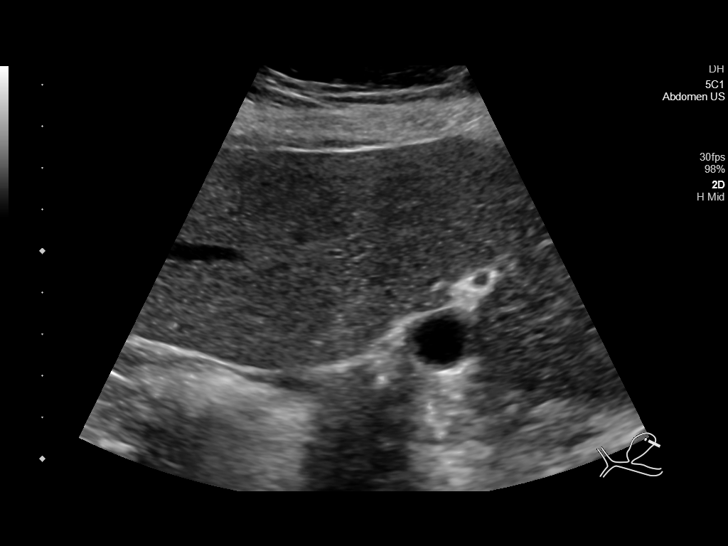
[im 7/37]
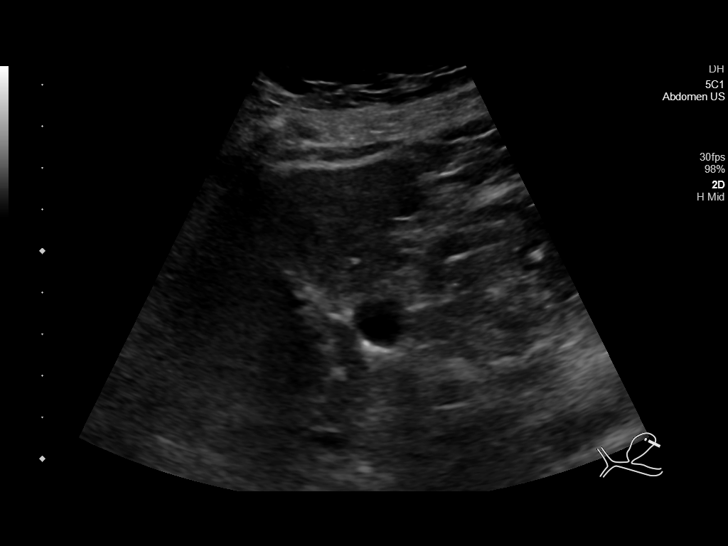
[im 10/37]
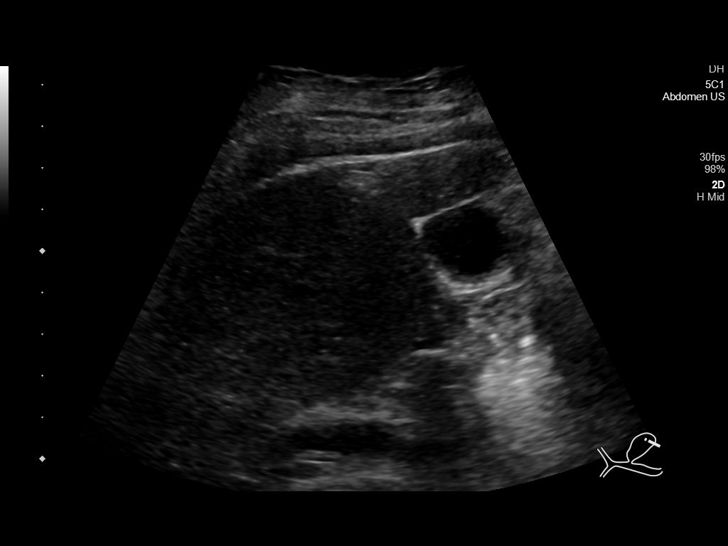
[im 13/37]
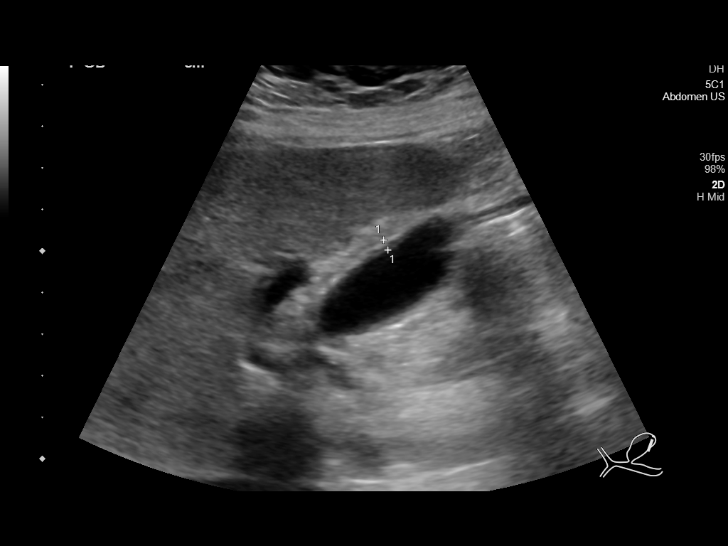
[im 14/37]
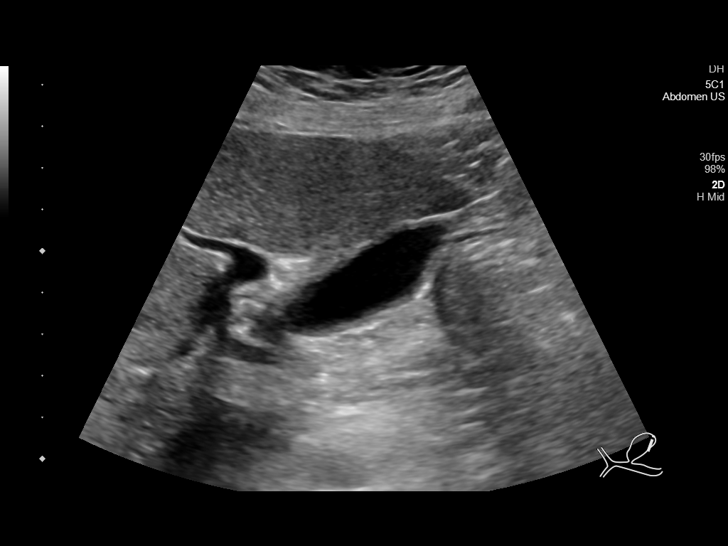
[im 17/37]
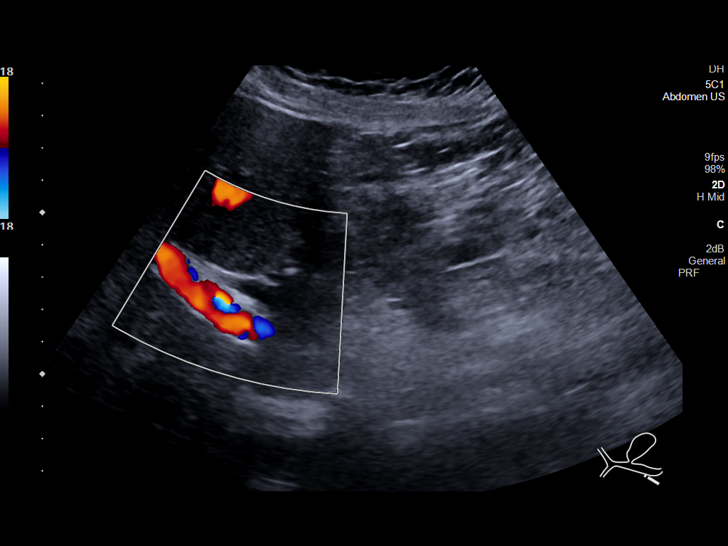
[im 20/37]
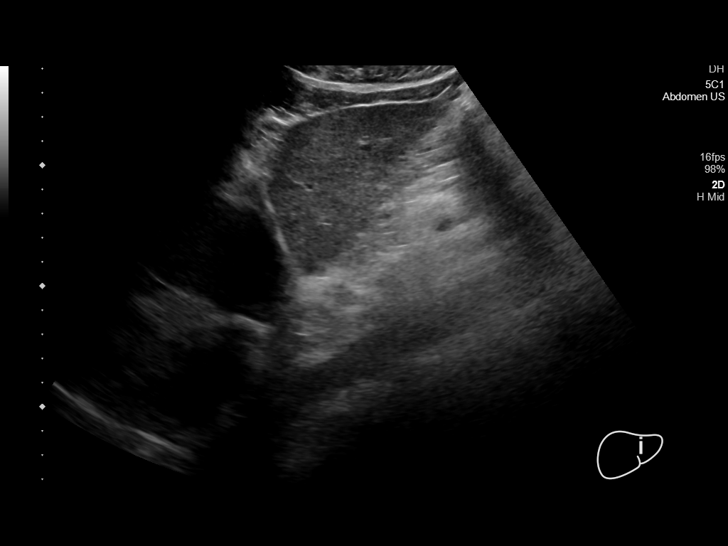
[im 23/37]
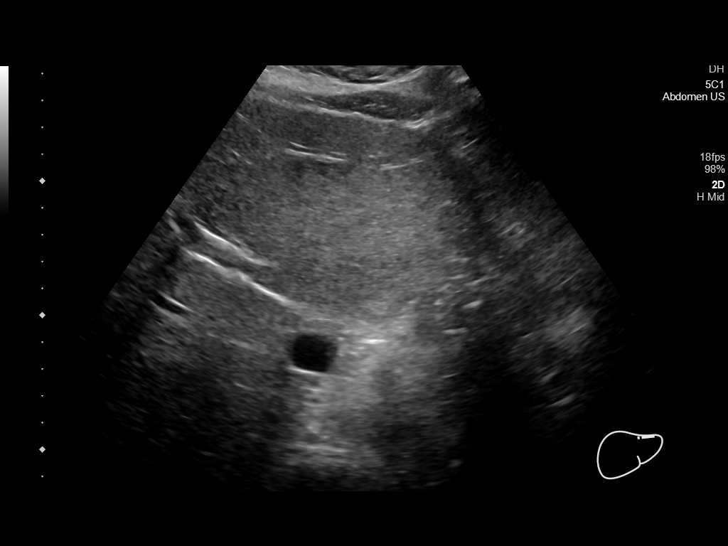
[im 25/37]
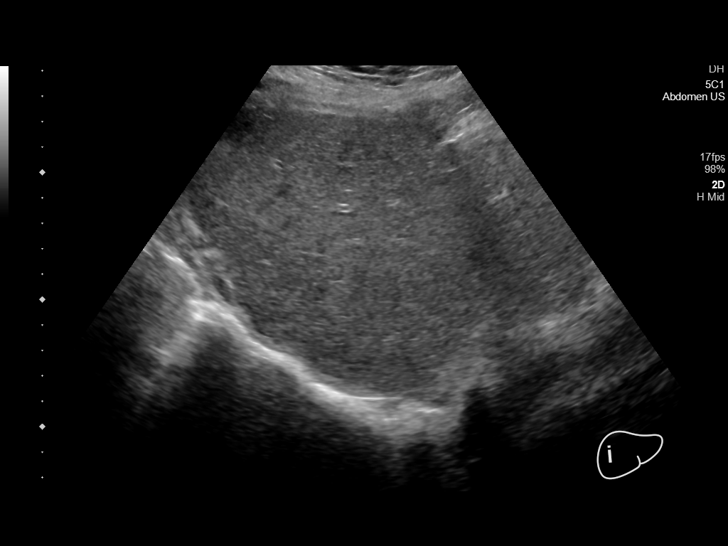
[im 28/37]
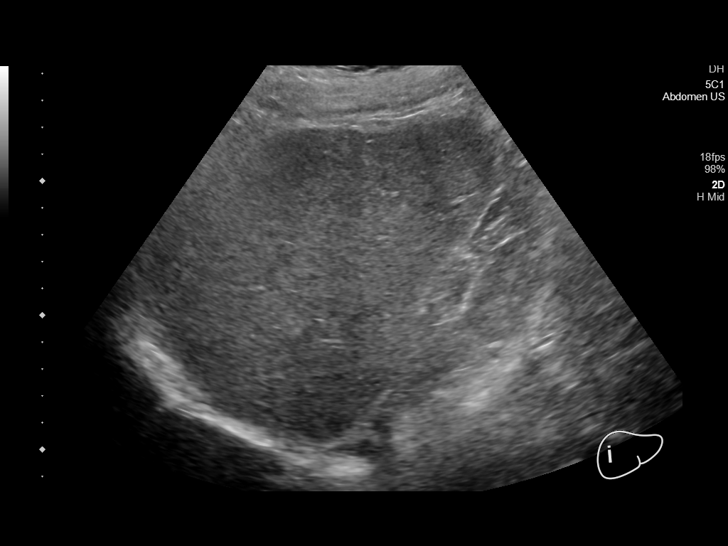
[im 31/37]
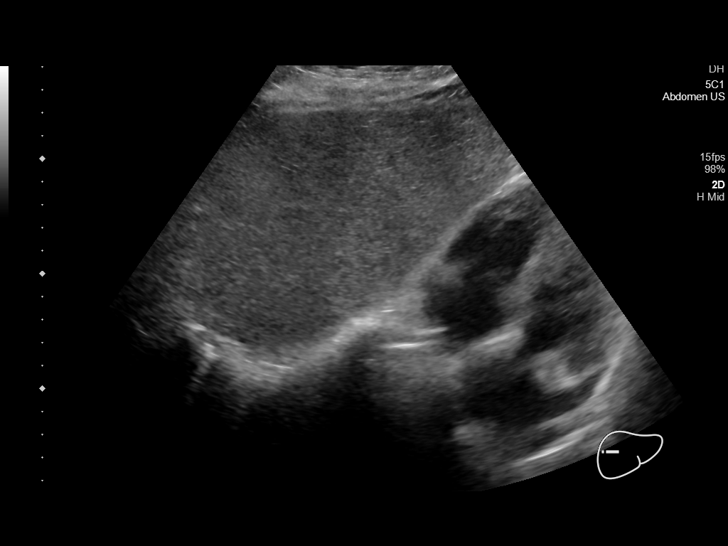
[im 34/37]
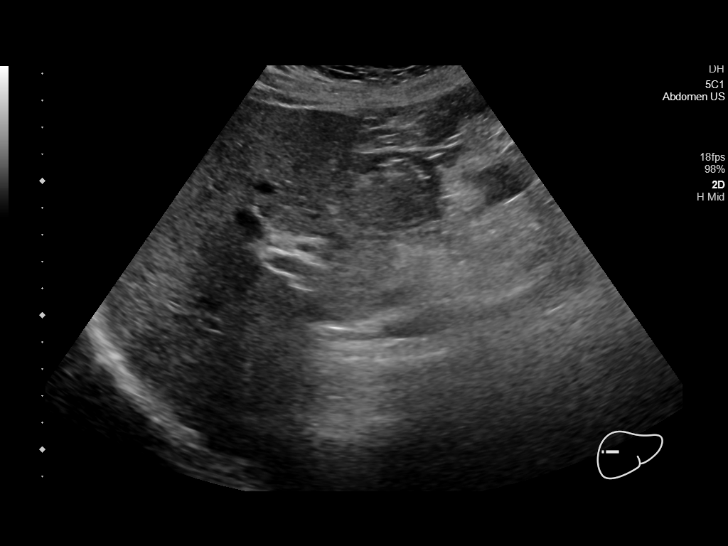
[im 37/37]
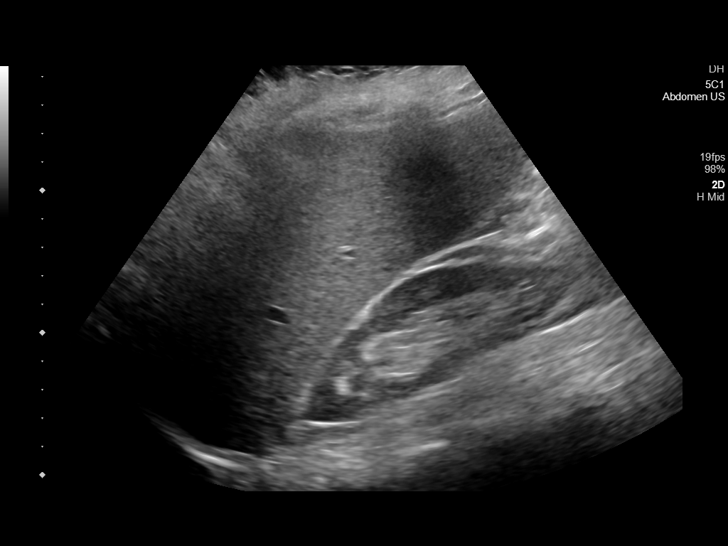

[14 of 25 positions shown; findings below may reference images not displayed]

FINDINGS: Gallbladder:

No gallstones or wall thickening visualized. No sonographic Murphy
sign noted by sonographer.

Common bile duct:

Diameter: 5.6 mm

Liver:

No focal lesion identified. Within normal limits in parenchymal
echogenicity. Portal vein is patent on color Doppler imaging with
normal direction of blood flow towards the liver.

Other: None.
IMPRESSION: Normal study. If there is clinical concern for an obstructing
process based off the patient's recent CT and laboratory studies,
consider further evaluation with MRCP.

## 2021-10-10 IMAGING — CT CT ABD-PELV W/ CM
2 of 5 series · 16 of 46 positions shown, 18 images · IV contrast (APPLIED)
Comparison: None.

CLINICAL DATA: Right lower quadrant abdominal pain

EXAM:
CT ABDOMEN AND PELVIS WITH CONTRAST
TECHNIQUE: Multidetector CT imaging of the abdomen and pelvis was performed
using the standard protocol following bolus administration of
intravenous contrast.
CONTRAST:  100mL OMNIPAQUE IOHEXOL 300 MG/ML  SOLN

[Series 2: axial st · axial · 0.66mm/px · z∈[-1197,-832]mm · 13 of 83 slices shown, 15 images]
[im 5/83  soft-tissue]
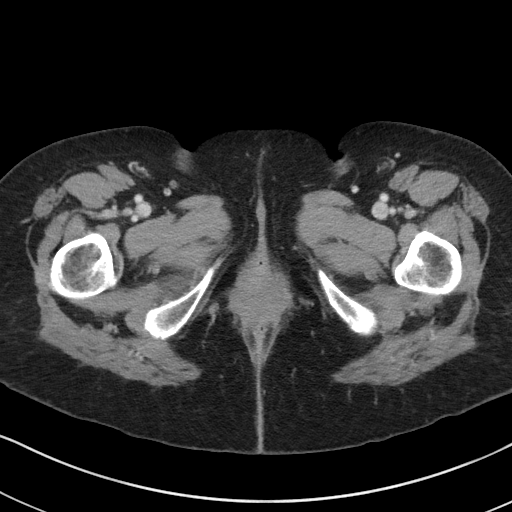
[im 5/83  bone]
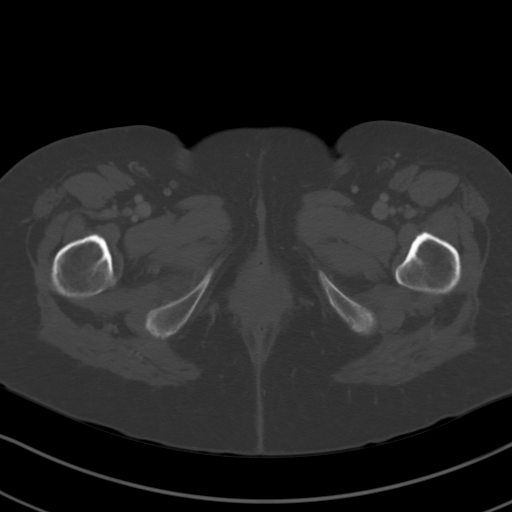
[im 10/83  soft-tissue]
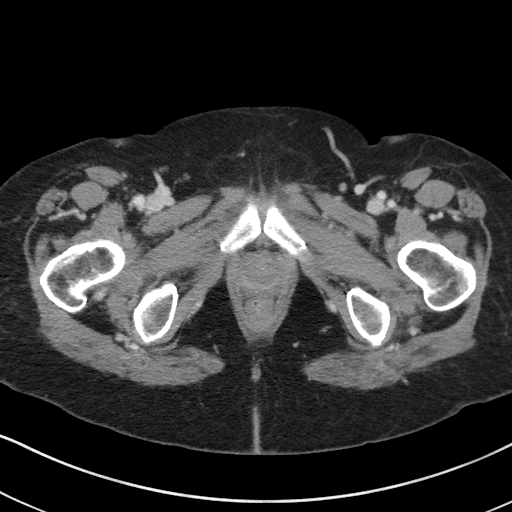
[im 19/83  soft-tissue]
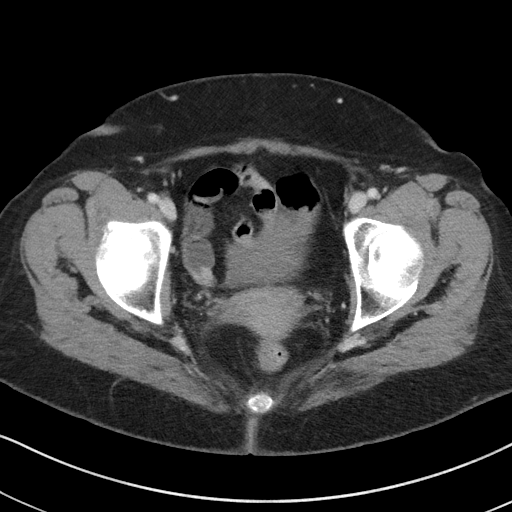
[im 23/83  soft-tissue]
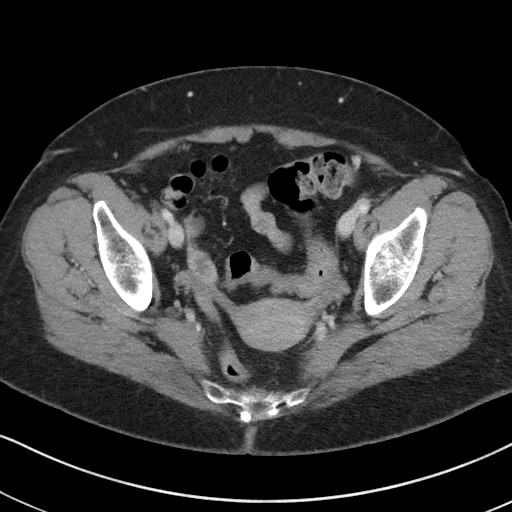
[im 28/83  soft-tissue]
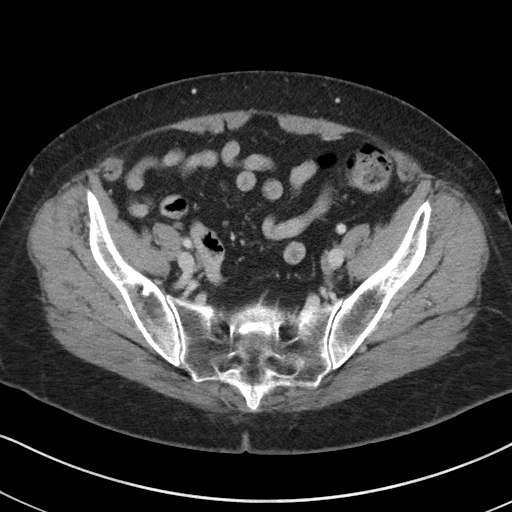
[im 37/83  soft-tissue]
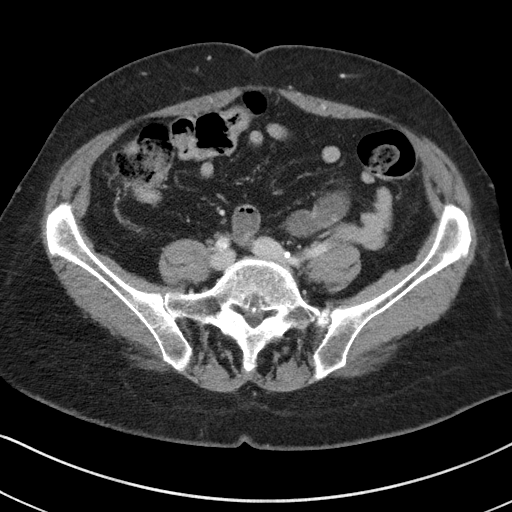
[im 42/83  soft-tissue]
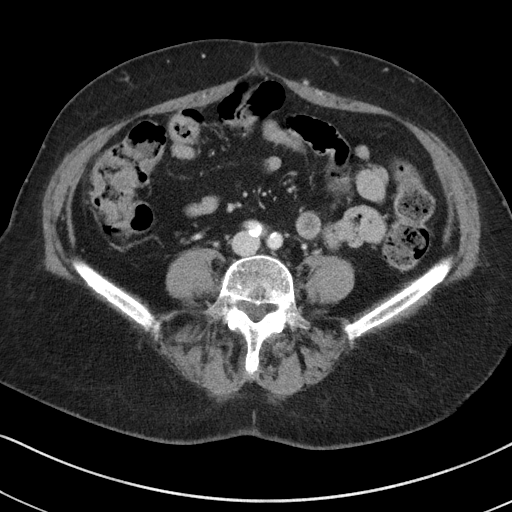
[im 46/83  soft-tissue]
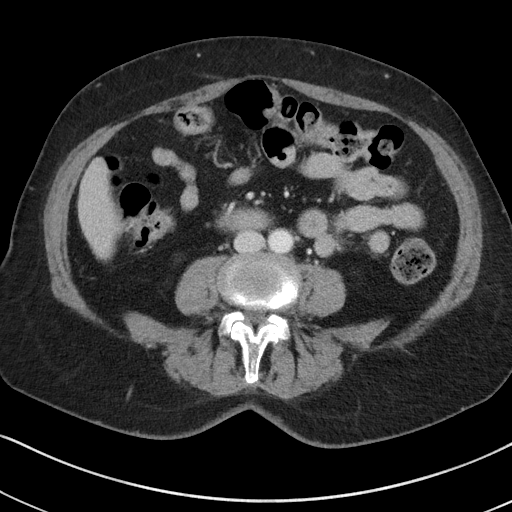
[im 55/83  soft-tissue]
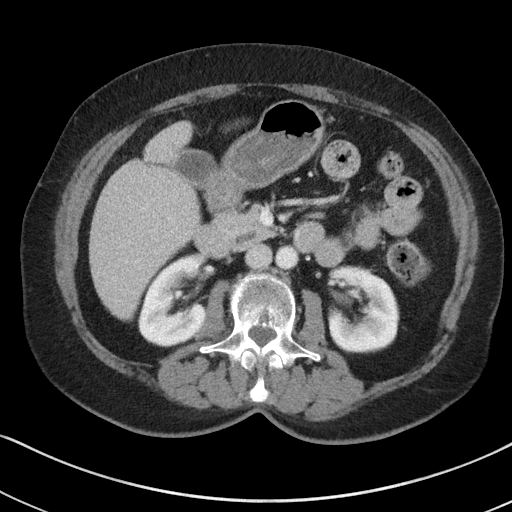
[im 55/83  bone]
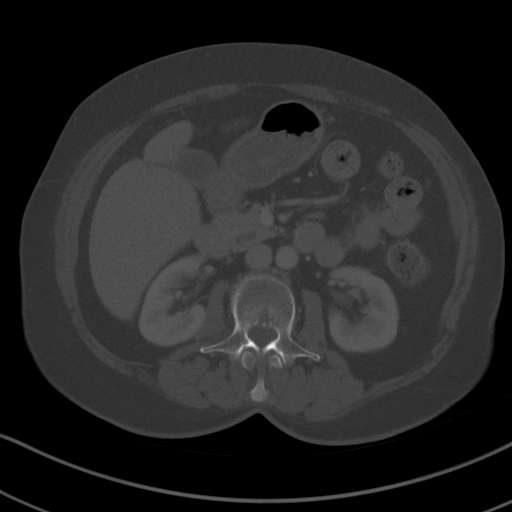
[im 60/83  soft-tissue]
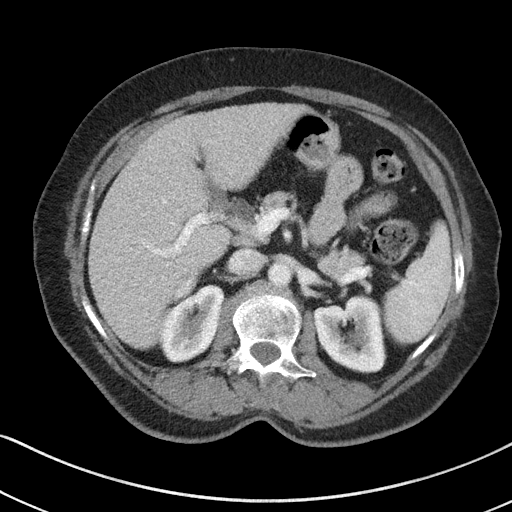
[im 64/83  soft-tissue]
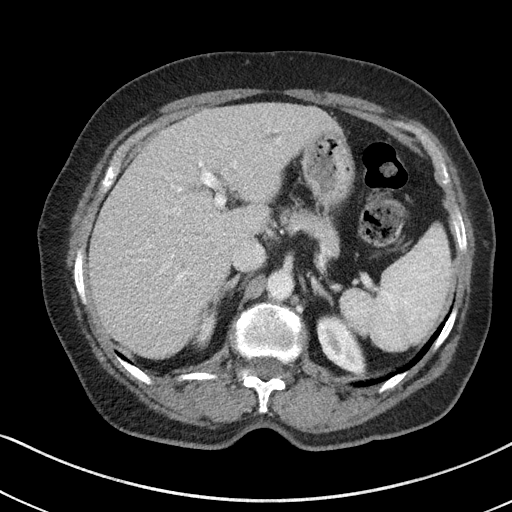
[im 73/83  soft-tissue]
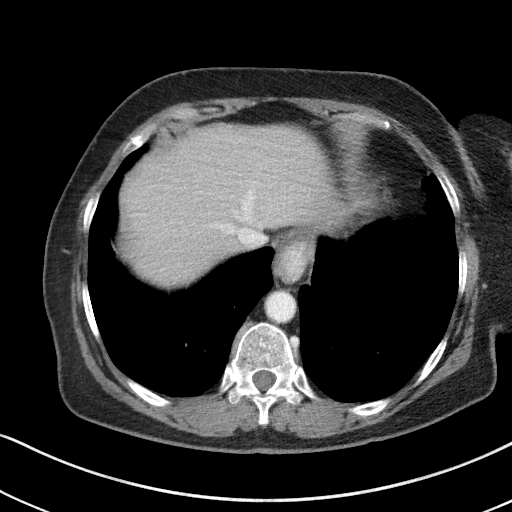
[im 78/83  soft-tissue]
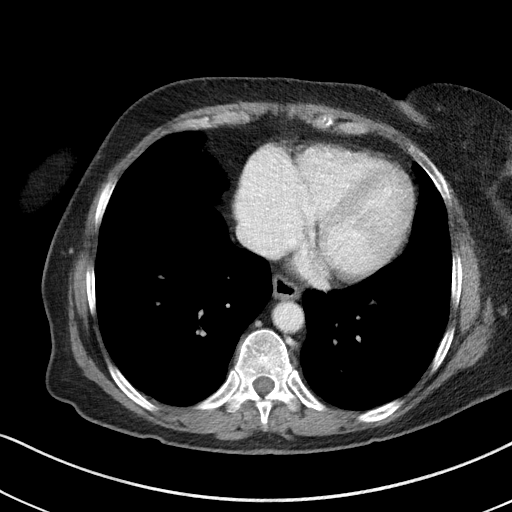

[Series 5: coronal st · coronal · 0.64mm/px · 3 of 83 slices shown]
[im 28/83  soft-tissue]
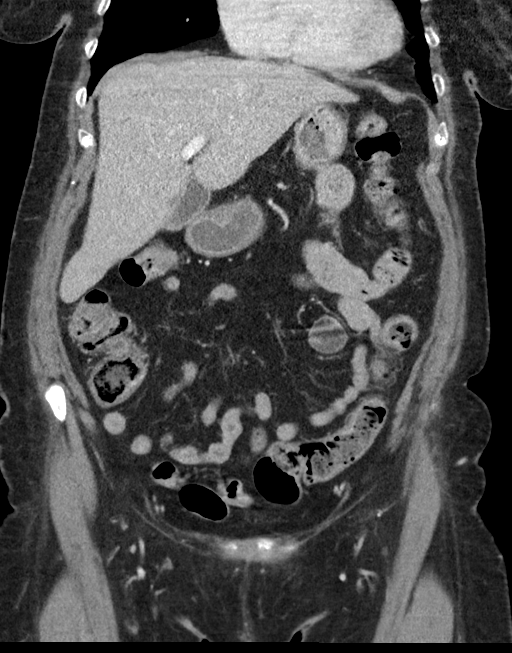
[im 37/83  soft-tissue]
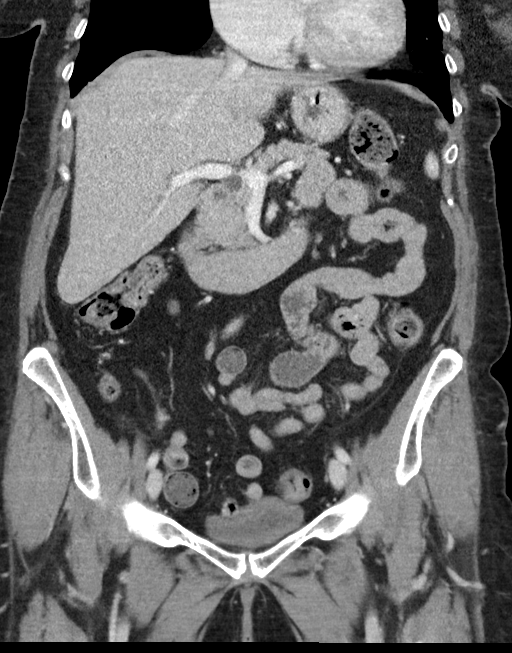
[im 46/83  soft-tissue]
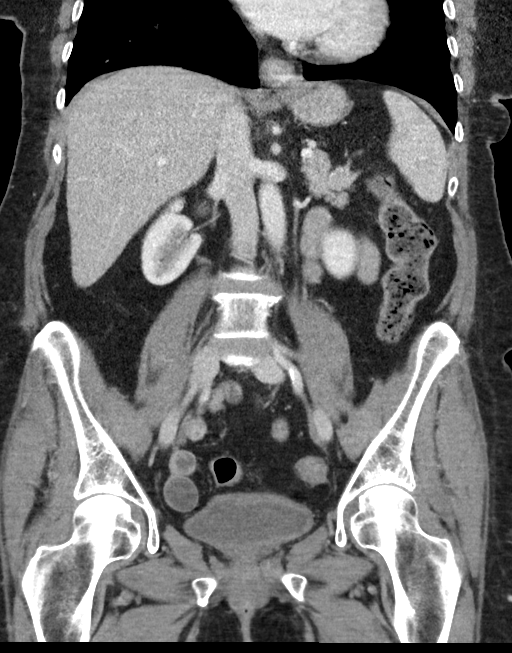

[16 of 46 positions shown; findings below may reference images not displayed]

FINDINGS: Lower chest: The visualized heart size within normal limits. No
pericardial fluid/thickening.

No hiatal hernia.

The visualized portions of the lungs are clear.

Hepatobiliary: The liver is normal in density without focal
abnormality.The main portal vein is patent. No calcified gallstones
are seen. There does however appear to be mild prominence of the
common bile duct measuring up to 7 mm and the proximal pancreatic
duct measuring up to 4 mm in dimension.

Pancreas: Tiny calcifications are seen within or adjacent to the
pancreatic head and midbody. No definite surrounding fat stranding
changes are noted.

Spleen: Normal in size without focal abnormality.

Adrenals/Urinary Tract: Both adrenal glands appear normal. The
kidneys and collecting system appear normal without evidence of
urinary tract calculus or hydronephrosis. The bladder is partially
decompressed with apparent mild wall thickening.

Stomach/Bowel: The stomach, small bowel, and colon are normal in
appearance. No inflammatory changes, wall thickening, or obstructive
findings.Scattered colonic diverticula are seen without
diverticulitis. The appendix is unremarkable.

Vascular/Lymphatic: There are no enlarged mesenteric,
retroperitoneal, or pelvic lymph nodes. Scattered mild aortic
atherosclerosis is noted.

Reproductive: The uterus and adnexa are unremarkable.

Other: Small fat containing anterior umbilical hernia noted.

Musculoskeletal: No acute or significant osseous findings. There is
a chronic slight anterior wedge compression deformity of the T11
vertebral body with endplate irregularity noted.
IMPRESSION: 1. No cholelithiasis, however there is mild prominence of the common
bile duct and proximal pancreatic duct which is nonspecific.
2. Normal appendix
3. Mild aortic Atherosclerosis ([id number]).

## 2021-10-10 NOTE — Telephone Encounter (Signed)
Requested Prescriptions  Pending Prescriptions Disp Refills  . omeprazole (PRILOSEC) 20 MG capsule [Pharmacy Med Name: Omeprazole 20 MG Oral Capsule Delayed Release] 180 capsule 0    Sig: TAKE 1 CAPSULE BY MOUTH TWICE DAILY BEFORE A MEAL     Gastroenterology: Proton Pump Inhibitors Passed - 10/10/2021  9:20 AM      Passed - Valid encounter within last 12 months    Recent Outpatient Visits          5 months ago Annual physical exam   [facility] Glean Hess, MD   12 months ago Subacute sinusitis, unspecified location   [facility] Glean Hess, MD   1 year ago Major depressive disorder with single episode, in full remission [facility])   [facility] Glean Hess, MD   1 year ago Suspected COVID-19 virus infection   [facility] Glean Hess, MD   1 year ago [facility] Glean Hess, MD      Future Appointments            In 6 months Army Melia Jesse Sans, MD [facility], [facility]

## 2021-10-11 ENCOUNTER — Other Ambulatory Visit (HOSPITAL_COMMUNITY)
Admission: RE | Admit: 2021-10-11 | Discharge: 2021-10-11 | Disposition: A | Discharge status: Home / Self Care | Payer: Medicare HMO | Source: Ambulatory Visit | Attending: Family Medicine | Admitting: Family Medicine

## 2021-10-11 ENCOUNTER — Ambulatory Visit (INDEPENDENT_AMBULATORY_CARE_PROVIDER_SITE_OTHER): Payer: Medicare HMO | Admitting: Family Medicine

## 2021-10-11 ENCOUNTER — Encounter: Payer: Self-pay | Admitting: Family Medicine

## 2021-10-11 VITALS — BP 118/80 | HR 80 | Ht 66.0 in | Wt 161.2 lb

## 2021-10-11 DIAGNOSIS — R300 Dysuria: Secondary | ICD-10-CM

## 2021-10-11 DIAGNOSIS — M25551 Pain in right hip: Secondary | ICD-10-CM

## 2021-10-11 LAB — POCT URINALYSIS DIPSTICK
Bilirubin, UA: NEGATIVE
Blood, UA: NEGATIVE
Glucose, UA: NEGATIVE
Ketones, UA: NEGATIVE
Leukocytes, UA: NEGATIVE
Nitrite, UA: NEGATIVE
Protein, UA: NEGATIVE
Spec Grav, UA: 1.02 (ref 1.010–1.025)
Urobilinogen, UA: 0.2 E.U./dL
pH, UA: 6 (ref 5.0–8.0)

## 2021-10-11 MED ORDER — MELOXICAM 15 MG PO TABS
15.0000 mg | ORAL_TABLET | Freq: Every day | ORAL | 0 refills | Status: DC
Start: 2021-10-11 — End: 2022-07-08

## 2021-10-11 NOTE — Assessment & Plan Note (Signed)
Patient presents with roughly 1 month history of right lower back pain, she raise concern for this being related to UTI.  She does have focal tenderness at the paraspinal musculature on the right, no CVA tenderness overtly, maximal tenderness that recreates her pain at the right SI joint.  Her spine exam is negative otherwise with a negative straight leg raise noted bilaterally.  Chart review shows that left SI joint pain was noted greater than 1 year prior, have discussed the same with the patient as well as recommendation for a 2-week course of meloxicam.  She does have some urinary symptoms that cannot be accounted for by SI joint pain, evaluation and management documented elsewhere.

## 2021-10-11 NOTE — Progress Notes (Signed)
     Primary Care / Sports Medicine Office Visit  Patient Information:  Patient ID: Claudia Gutierrez, female DOB: Aug 15, 1950 Age: 71 y.o. MRN: [id number]   Claudia Gutierrez is a pleasant 71 y.o. female presenting with the following:  Chief Complaint  Patient presents with   Urinary Tract Infection    Dark urine, some pressure, frequent, for 2 weeks    Vitals:   10/11/21 1000  BP: 118/80  Pulse: 80  SpO2: 98%   Vitals:   10/11/21 1000  Weight: 161 lb 3.2 oz (73.1 kg)  Height: '5\' 6"'$  (1.676 m)   Body mass index is 26.02 kg/m.  No results found.   Independent interpretation of notes and tests performed by another provider:   None  Procedures performed:   None  Pertinent History, Exam, Impression, and Recommendations:   Problem List Items Addressed This Visit       Other   Arthralgia of right side of pelvis    Patient presents with roughly 1 month history of right lower back pain, she raise concern for this being related to UTI.  She does have focal tenderness at the paraspinal musculature on the right, no CVA tenderness overtly, maximal tenderness that recreates her pain at the right SI joint.  Her spine exam is negative otherwise with a negative straight leg raise noted bilaterally.  Chart review shows that left SI joint pain was noted greater than 1 year prior, have discussed the same with the patient as well as recommendation for a 2-week course of meloxicam.  She does have some urinary symptoms that cannot be accounted for by SI joint pain, evaluation and management documented elsewhere.      Relevant Medications   meloxicam (MOBIC) 15 MG tablet   Other Relevant Orders   Urine Culture   Cervicovaginal ancillary only   Dysuria - Primary    Patient reports 2-week history of vaginal itching, dysuria, dark-colored urine, right flank/low back pain.  Denies any discharge, fevers, chills, GI symptoms.  Her exam does not reveal CVA tenderness or suprapubic tenderness,  abdominal examination is benign, she does have tenderness at the right SI joint which recreates her described symptoms.  Urinalysis reviewed, reassuring, plan for self swab, urine culture, supportive care.  Pending the test, can dictate next steps.  If infectious work-up negative, her symptoms may represent vulvovaginal atrophy and appropriate treatment can be coordinated.      Relevant Orders   POCT urinalysis dipstick (Completed)   Urine Culture   Cervicovaginal ancillary only     Orders & Medications Meds ordered this encounter  Medications   meloxicam (MOBIC) 15 MG tablet    Sig: Take 1 tablet (15 mg total) by mouth daily.    Dispense:  14 tablet    Refill:  0   Orders Placed This Encounter  Procedures   Urine Culture   POCT urinalysis dipstick     Return if symptoms worsen or fail to improve.     Montel Culver, MD   Primary Care Sports Medicine [place]

## 2021-10-11 NOTE — Patient Instructions (Signed)
-   Start meloxicam once daily with food, dose x2 weeks - Do not take any other NSAIDs (ibuprofen, naproxen) while on this medication - Perform activity as tolerated using symptoms as a guide - We will contact you with test results and next steps - Reach out to [place] for any questions between now and then

## 2021-10-11 NOTE — Assessment & Plan Note (Signed)
Patient reports 2-week history of vaginal itching, dysuria, dark-colored urine, right flank/low back pain.  Denies any discharge, fevers, chills, GI symptoms.  Her exam does not reveal CVA tenderness or suprapubic tenderness, abdominal examination is benign, she does have tenderness at the right SI joint which recreates her described symptoms.  Urinalysis reviewed, reassuring, plan for self swab, urine culture, supportive care.  Pending the test, can dictate next steps.  If infectious work-up negative, her symptoms may represent vulvovaginal atrophy and appropriate treatment can be coordinated.

## 2021-10-12 LAB — CERVICOVAGINAL ANCILLARY ONLY
Bacterial Vaginitis (gardnerella): NEGATIVE
Candida Glabrata: NEGATIVE
Candida Vaginitis: NEGATIVE
Chlamydia: NEGATIVE
Comment: NEGATIVE
Comment: NEGATIVE
Comment: NEGATIVE
Comment: NEGATIVE
Comment: NEGATIVE
Comment: NORMAL
Neisseria Gonorrhea: NEGATIVE
Trichomonas: NEGATIVE

## 2021-10-12 LAB — URINE CULTURE: Culture: <10000 — AB

## 2021-10-22 ENCOUNTER — Other Ambulatory Visit: Payer: Self-pay | Admitting: Internal Medicine

## 2021-10-23 ENCOUNTER — Other Ambulatory Visit: Payer: Self-pay

## 2021-10-29 ENCOUNTER — Ambulatory Visit (INDEPENDENT_AMBULATORY_CARE_PROVIDER_SITE_OTHER): Payer: Medicare HMO | Admitting: Surgery

## 2021-10-29 ENCOUNTER — Encounter: Payer: Self-pay | Admitting: Surgery

## 2021-10-29 ENCOUNTER — Ambulatory Visit: Payer: Medicare HMO | Admitting: Nurse Practitioner

## 2021-10-29 VITALS — BP 145/72 | HR 79 | Temp 98.0°F | Ht 66.0 in | Wt 161.0 lb

## 2021-10-29 DIAGNOSIS — R197 Diarrhea, unspecified: Secondary | ICD-10-CM | POA: Diagnosis not present

## 2021-10-29 DIAGNOSIS — K602 Anal fissure, unspecified: Secondary | ICD-10-CM | POA: Diagnosis not present

## 2021-10-29 NOTE — Progress Notes (Signed)
10/29/2021  History of Present Illness: Claudia Gutierrez is a 71 y.o. female presenting for follow up of an anal fissure.  The patient is s/p EUA with Botox chemical sphincterotomy of internal sphincter on 06/26/20 with Dr. Celine Ahr.  She was seen on 05/22/21 by Dr. Christian Mate for recurrence of symptoms.  She was recommended a high fiber diet and Miralax as needed to avoid constipation.  The patient reports that recently she's been having issues with diarrhea/loose stools rather than constipation, and she's now having again pain/burning with bowel movements and some bleeding.  She does not take a fiber supplement but she reports she eats grain cereal and sweet potatoes.  Denies constipation but reports loose stools and have about 2-3 bowel movements per day.  Denies any incontinence.  When she wipes, she feels she has to go into the anal canal with the tissue paper in order to wipe well.  Past Medical History: Past Medical History:  Diagnosis Date   Acid reflux    Anxiety    Complication of anesthesia    ponv   History of kidney stones    PONV (postoperative nausea and vomiting)    2002 colonoscopy   Wears dentures    Full upper     Past Surgical History: Past Surgical History:  Procedure Laterality Date   ACHILLES TENDON REPAIR  2008   ARTHRODESIS METATARSALPHALANGEAL JOINT (MTPJ) Left 10/18/2020   Procedure: ARTHRODESIS METATARSALPHALANGEAL JOINT (MTPJ);  Surgeon: Samara Deist, DPM;  Location: [place];  Service: Podiatry;  Laterality: Left;  Anesthesia- General with local   BIOPSY  10/21/2019   Procedure: BIOPSY;  Surgeon: Milus Banister, MD;  Location: [facility];  Service: Endoscopy;;   BOTOX INJECTION N/A 06/26/2020   Procedure: BOTOX INJECTION into internal anal sphincter muscles;  Surgeon: Fredirick Maudlin, MD;  Location: [facility];  Service: General;  Laterality: N/A;  Provider requesting 30 minutes for procedure   BREAST CYST ASPIRATION Right 2010   CATARACT EXTRACTION  W/PHACO Right 12/21/2019   Procedure: CATARACT EXTRACTION PHACO AND INTRAOCULAR LENS PLACEMENT ([place]) RIGHT 4.15 00:31.2;  Surgeon: Birder Robson, MD;  Location: [place];  Service: Ophthalmology;  Laterality: Right;   CATARACT EXTRACTION W/PHACO Left 01/11/2020   Procedure: CATARACT EXTRACTION PHACO AND INTRAOCULAR LENS PLACEMENT ([facility]) LEFT 4.78 00:26.5;  Surgeon: Birder Robson, MD;  Location: [place];  Service: Ophthalmology;  Laterality: Left;   COLONOSCOPY WITH PROPOFOL N/A 09/13/2019   Procedure: COLONOSCOPY WITH PROPOFOL;  Surgeon: Jonathon Bellows, MD;  Location: [facility];  Service: Gastroenterology;  Laterality: N/A;   ESOPHAGOGASTRODUODENOSCOPY (EGD) WITH PROPOFOL N/A 10/21/2019   Procedure: ESOPHAGOGASTRODUODENOSCOPY (EGD) WITH PROPOFOL;  Surgeon: Milus Banister, MD;  Location: [facility];  Service: Endoscopy;  Laterality: N/A;   EUS N/A 10/21/2019   Procedure: UPPER ENDOSCOPIC ULTRASOUND (EUS) RADIAL;  Surgeon: Milus Banister, MD;  Location: [facility];  Service: Endoscopy;  Laterality: N/A;   FINE NEEDLE ASPIRATION N/A 10/21/2019   Procedure: FINE NEEDLE ASPIRATION (FNA) LINEAR;  Surgeon: Milus Banister, MD;  Location: [facility];  Service: Endoscopy;  Laterality: N/A;   HEMORRHOID SURGERY N/A 06/26/2020   Procedure: HEMORRHOIDECTOMY;  Surgeon: Fredirick Maudlin, MD;  Location: [facility];  Service: General;  Laterality: N/A;    Home Medications: Prior to Admission medications   Medication Sig Start Date End Date Taking? Authorizing Provider  albuterol (VENTOLIN HFA) 108 (90 Base) MCG/ACT inhaler Inhale 1-2 puffs into the lungs every 6 (six) hours as needed. 06/18/21   Parrett,  Tammy S, NP  atorvastatin (LIPITOR) 10 MG tablet Take 1 tablet by mouth daily 08/28/21   Glean Hess, MD  BIOTIN PO Take 1,000 mg by mouth daily.    [provider]  Cholecalciferol (VITAMIN D-3) 125 MCG (5000 UT) TABS Take 125 mcg by mouth daily.    [provider]  citalopram (CELEXA) 20 MG tablet Take 1 tablet by mouth once daily 10/23/21   Glean Hess, MD  fluticasone furoate-vilanterol (BREO ELLIPTA) 100-25 MCG/ACT AEPB Inhale 1 puff into the lungs daily. 04/25/21   Glean Hess, MD  folic acid (FOLVITE) 425 MCG tablet Take 800 mcg by mouth daily.    [provider]  Lactobacillus (ACIDOPHILUS) CAPS capsule Take 1 capsule by mouth daily.    [provider]  meloxicam (MOBIC) 15 MG tablet Take 1 tablet (15 mg total) by mouth daily. 10/11/21   Montel Culver, MD  Omega-3 Fatty Acids (FISH OIL PO) Take 1 capsule by mouth daily.    [provider]  omeprazole (PRILOSEC) 20 MG capsule TAKE 1 CAPSULE BY MOUTH TWICE DAILY BEFORE A MEAL 10/10/21   Glean Hess, MD  traZODone (DESYREL) 100 MG tablet Take 1 tablet (100 mg total) by mouth at bedtime as needed. for sleep 08/29/21   Glean Hess, MD    Allergies: Allergies  Allergen Reactions   Azithromycin Other (See Comments)    SWELLING/EDEMA   Paroxetine Hcl Nausea Only and Other (See Comments)    GI Upset   Ciprofloxacin Itching    Phlebitis    Sodium Clavulanate Diarrhea   Codeine Nausea And Vomiting    Review of Systems: Review of Systems  Constitutional:  Negative for chills and fever.  Respiratory:  Negative for shortness of breath.   Cardiovascular:  Negative for chest pain.  Gastrointestinal:  Positive for diarrhea. Negative for abdominal pain, nausea and vomiting.  Genitourinary:  Negative for dysuria.  Skin:  Negative for rash.    Physical Exam BP (!) 145/72   Pulse 79   Temp 98 F (36.7 C)   Ht '5\' 6"'$  (1.676 m)   Wt 161 lb (73 kg)   SpO2 95%   BMI 25.99 kg/m  CONSTITUTIONAL: No acute distress, well nourished. HEENT:  Normocephalic, atraumatic, extraocular motion intact. RESPIRATORY:  Normal respiratory effort without pathologic use of accessory muscles. CARDIOVASCULAR: Regular rhythm and rate RECTAL:  External exam  reveals a very mild, very shallow posterior anal fissue, with no significant depth, no bleeding, but does have some discomfort to palpation.  On DRE, there's evidence of a small posterior internal hemorrhoid but there's no bleeding.  Sphincter tone is normal. NEUROLOGIC:  Motor and sensation is grossly normal.  Cranial nerves are grossly intact. PSYCH:  Alert and oriented to person, place and time. Affect is normal.   Assessment and Plan: This is a 71 y.o. female with anal fissure and diarrhea  --Discussed with the patient that the fissure she used to have may be coming back but with a very shallow/superficial type of wound.  No significant inflammation but there is discomfort to palpation.  Sphincter tone is normal.  At this point, I do not think we need to do nifedipine or another round of Botox.  I think the main issue is likely excoriation/irritation from the diarrhea and the wiping that is causing the skin changes. --Recommended that she start fiber supplement with Benefiber or Metamucil to help with the diarrhea.  This will absorb the excess  fluid and help bulk the stool better.  Discussed the fine balance between constipation and diarrhea, and how both can contribute to perianal issues.  She can also do Sitz baths to help with soothe any raw tissue. --Follow up in a month to reassess her progress.  I spent 20 minutes dedicated to the care of this patient on the date of this encounter to include pre-visit review of records, face-to-face time with the patient discussing diagnosis and management, and any post-visit coordination of care.   Melvyn Neth, [facility]

## 2021-10-29 NOTE — Patient Instructions (Addendum)
You may use Metamucil or Benefiber once a day to try to help firm up your stools.  You may also use moist wipes for after your bowel movements to help decrease the irritation to the area.   Do take a shower after each bowel movement or you may do a sitz bath over your toilet.   Follow-up with our office as needed.  Please call and ask to speak with a nurse if you develop questions or concerns.   How to Take a [employer] A sitz bath is a warm water bath that may be used to care for your rectum, genital area, or the area between your rectum and genitals (perineum). In a sitz bath, the water only comes up to your hips and covers your buttocks. A sitz bath may be done in a bathtub or with a portable sitz bath that fits over the toilet. Your health care provider may recommend a sitz bath to help: Relieve pain and discomfort after delivering a baby. Relieve pain and itching from hemorrhoids or anal fissures. Relieve pain after certain surgeries. Relax muscles that are sore or tight. How to take a sitz bath Take 3-4 sitz baths a day, or as many as told by your health care provider. Bathtub sitz bath To take a sitz bath in a bathtub: Partially fill a bathtub with warm water. The water should be deep enough to cover your hips and buttocks when you are sitting in the tub. Follow your health care provider's instructions if you are told to put medicine in the water. Sit in the water. Open the tub drain a little, and leave it open during your bath. Turn on the warm water again, enough to replace the water that is draining out. Keep the water running throughout your bath. This helps keep the water at the right level and temperature. Soak in the water for 15-20 minutes, or as long as told by your health care provider. When you are done, be careful when you stand up. You may feel dizzy. After the sitz bath, pat yourself dry. Do not rub your skin to dry it.  Over-the-toilet sitz bath To take a sitz bath  with an over-the-toilet basin: Follow the manufacturer's instructions. Fill the basin with warm water. Follow your health care provider's instructions if you were told to put medicine in the water. Sit on the seat. Make sure the water covers your buttocks and perineum. Soak in the water for 15-20 minutes, or as long as told by your health care provider. After the sitz bath, pat yourself dry. Do not rub your skin to dry it. Clean and dry the basin between uses. Discard the basin if it cracks, or according to the manufacturer's instructions.  Contact a health care provider if: Your pain or itching gets worse. Do not continue with sitz baths if your symptoms get worse. You have new symptoms. Do not continue with sitz baths until you talk with your health care provider. Summary A sitz bath is a warm water bath in which the water only comes up to your hips and covers your buttocks. A sitz bath may help relieve pain and discomfort after delivering a baby. It also may help with pain and itching from hemorrhoids or anal fissures, or pain after certain surgeries. It can also help to relax muscles that are sore or tight. Take 3-4 sitz baths a day, or as many as told by your health care provider. Soak in the water for 15-20 minutes. Do  not continue with sitz baths if your symptoms get worse. This information is not intended to replace advice given to you by your health care provider. Make sure you discuss any questions you have with your health care provider. Document Revised: 11/30/2019 Document Reviewed: 12/02/2019 Elsevier Patient Education  [place].

## 2021-11-27 ENCOUNTER — Telehealth: Payer: Self-pay | Admitting: Internal Medicine

## 2021-11-27 NOTE — Telephone Encounter (Signed)
Copied from [place] [phone]. Topic: Medicare AWV >> Nov 27, 2021 10:13 AM Jae Dire wrote: Reason for CRM:  Left message for patient to call back and schedule Medicare Annual Wellness Visit (AWV) in office.   If unable to come into the office for AWV,  please offer to do virtually or by telephone.  No hx of AWV eligible for AWVI per [employer] as of 05/30/2020  Please schedule at anytime with [facility] Health Advisor.      45 minute appointment   Any questions, please call me at [phone]

## 2021-12-06 ENCOUNTER — Other Ambulatory Visit: Payer: Self-pay | Admitting: Internal Medicine

## 2021-12-06 DIAGNOSIS — I700 Atherosclerosis of aorta: Secondary | ICD-10-CM

## 2021-12-06 NOTE — Telephone Encounter (Signed)
Pt is completely out of her current supply   Medication Refill - Medication: atorvastatin (LIPITOR) 10 MG tablet   Has the patient contacted their pharmacy? Yes.   (Agent: If no, request that the patient contact the pharmacy for the refill. If patient does not wish to contact the pharmacy document the reason why and proceed with request.) (Agent: If yes, when and what did the pharmacy advise?)  Preferred Pharmacy (with phone number or street name):  [place], [place] - [address]  [place] [address]  Phone: [phone] Fax: [phone]   Has the patient been seen for an appointment in the last year OR does the patient have an upcoming appointment? Yes.    Agent: Please be advised that RX refills may take up to 3 business days. We ask that you follow-up with your pharmacy.

## 2021-12-07 NOTE — Telephone Encounter (Signed)
last RF 08/28/21 #90 2 RF  Requested Prescriptions  Refused Prescriptions Disp Refills  . atorvastatin (LIPITOR) 10 MG tablet 90 tablet 2    Sig: Take 1 tablet (10 mg total) by mouth daily.     Cardiovascular:  Antilipid - Statins Failed - 12/06/2021 12:16 PM      Failed - Lipid Panel in normal range within the last 12 months    Cholesterol, Total  Date Value Ref Range Status  04/25/2021 209 (H) 100 - 199 mg/dL Final   LDL Chol Calc (NIH)  Date Value Ref Range Status  04/25/2021 63 0 - 99 mg/dL Final   HDL  Date Value Ref Range Status  04/25/2021 131 >39 mg/dL Final   Triglycerides  Date Value Ref Range Status  04/25/2021 91 0 - 149 mg/dL Final         Passed - Patient is not pregnant      Passed - Valid encounter within last 12 months    Recent Outpatient Visits          1 month ago [place] [facility] at [place], Earley Abide, MD   7 months ago Annual physical exam   [place] [facility] at [facility], Jesse Sans, MD   1 year ago Subacute sinusitis, unspecified location   [facility] at [facility], Jesse Sans, MD   1 year ago Major depressive disorder with single episode, in full remission [facility])   [place] [facility] at [facility], Jesse Sans, MD   1 year ago Suspected COVID-19 virus infection   [place] [facility] Medicine at [facility], Jesse Sans, MD      Future Appointments            In 4 months Army Melia, Jesse Sans, MD [place] and Sports Medicine at [facility], [facility]

## 2021-12-10 ENCOUNTER — Ambulatory Visit: Payer: MEDICARE | Admitting: Surgery

## 2021-12-17 ENCOUNTER — Ambulatory Visit: Payer: Medicare HMO | Admitting: Internal Medicine

## 2021-12-24 ENCOUNTER — Encounter: Payer: Self-pay | Admitting: Internal Medicine

## 2022-01-03 ENCOUNTER — Other Ambulatory Visit: Payer: Self-pay | Admitting: Internal Medicine

## 2022-01-03 DIAGNOSIS — K219 Gastro-esophageal reflux disease without esophagitis: Secondary | ICD-10-CM

## 2022-01-03 NOTE — Telephone Encounter (Signed)
Requested Prescriptions  Pending Prescriptions Disp Refills  . omeprazole (PRILOSEC) 20 MG capsule [Pharmacy Med Name: Omeprazole 20 MG Oral Capsule Delayed Release] 180 capsule 1    Sig: TAKE 1 CAPSULE BY MOUTH TWICE DAILY BEFORE A MEAL     Gastroenterology: Proton Pump Inhibitors Passed - 01/03/2022  9:41 AM      Passed - Valid encounter within last 12 months    Recent Outpatient Visits          2 months ago [place] [facility] at [place], Earley Abide, MD   8 months ago Annual physical exam   [place] [facility] at [facility], Jesse Sans, MD   1 year ago Subacute sinusitis, unspecified location   [facility] and [facility] at [facility], Jesse Sans, MD   1 year ago Major depressive disorder with single episode, in full remission [facility])   [place] [facility] at [facility], Jesse Sans, MD   1 year ago Suspected COVID-19 virus infection   [place] [facility] at [facility], Jesse Sans, MD      Future Appointments            In 3 months Army Melia, Jesse Sans, MD [place] [facility] Medicine at [facility], [facility]

## 2022-01-14 ENCOUNTER — Ambulatory Visit (INDEPENDENT_AMBULATORY_CARE_PROVIDER_SITE_OTHER): Payer: Medicare HMO | Admitting: Internal Medicine

## 2022-01-14 ENCOUNTER — Other Ambulatory Visit: Payer: Self-pay | Admitting: Internal Medicine

## 2022-01-14 ENCOUNTER — Encounter: Payer: Self-pay | Admitting: Internal Medicine

## 2022-01-14 VITALS — BP 128/74 | HR 73 | Ht 66.0 in | Wt 161.2 lb

## 2022-01-14 DIAGNOSIS — M533 Sacrococcygeal disorders, not elsewhere classified: Secondary | ICD-10-CM

## 2022-01-14 DIAGNOSIS — Z23 Encounter for immunization: Secondary | ICD-10-CM

## 2022-01-14 LAB — POCT URINALYSIS DIPSTICK
Bilirubin, UA: NEGATIVE
Glucose, UA: NEGATIVE
Ketones, UA: NEGATIVE
Leukocytes, UA: NEGATIVE
Nitrite, UA: NEGATIVE
Protein, UA: NEGATIVE
Spec Grav, UA: 1.025 (ref 1.010–1.025)
Urobilinogen, UA: 0.2 E.U./dL
pH, UA: 6 (ref 5.0–8.0)

## 2022-01-14 MED ORDER — METHOCARBAMOL 500 MG PO TABS: 500.0000 mg | ORAL_TABLET | Freq: Four times a day (QID) | ORAL | 0 refills | Status: DC

## 2022-01-14 MED ORDER — PREDNISONE 10 MG PO TABS: 10.0000 mg | ORAL_TABLET | ORAL | 0 refills | Status: AC

## 2022-01-14 NOTE — Progress Notes (Signed)
Date:  01/14/2022   Name:  Claudia Gutierrez   DOB:  1950-05-26   MRN:  [id number]   Chief Complaint: Back Pain (Right Flank pain, lower right back pain, and radiates into right buttocks. )  Back Pain This is a recurrent problem. The current episode started in the past 7 days. The problem occurs constantly. The problem is unchanged. The pain is present in the sacro-iliac. The quality of the pain is described as aching, cramping and shooting. The pain does not radiate. The pain is moderate. The symptoms are aggravated by sitting and twisting. Pertinent negatives include no abdominal pain, bladder incontinence, bowel incontinence, chest pain, fever, leg pain, numbness, paresthesias, perianal numbness or weakness.    Lab Results  Component Value Date   NA 140 04/25/2021   K 4.6 04/25/2021   CO2 24 04/25/2021   GLUCOSE 95 04/25/2021   BUN 14 04/25/2021   CREATININE 0.94 04/25/2021   CALCIUM 9.7 04/25/2021   EGFR 65 04/25/2021   GFRNONAA >60 11/11/2020   Lab Results  Component Value Date   CHOL 209 (H) 04/25/2021   HDL 131 04/25/2021   LDLCALC 63 04/25/2021   TRIG 91 04/25/2021   CHOLHDL 1.6 04/25/2021   Lab Results  Component Value Date   TSH 4.900 (H) 04/25/2021   No results found for: "HGBA1C" Lab Results  Component Value Date   WBC 5.3 04/25/2021   HGB 13.0 04/25/2021   HCT 40.4 04/25/2021   MCV 91 04/25/2021   PLT 184 04/25/2021   Lab Results  Component Value Date   ALT 14 04/25/2021   AST 25 04/25/2021   ALKPHOS 83 04/25/2021   BILITOT 0.5 04/25/2021   No results found for: "25OHVITD2", "25OHVITD3", "VD25OH"   Review of Systems  Constitutional:  Negative for chills, fatigue and fever.  Respiratory:  Negative for chest tightness and shortness of breath.   Cardiovascular:  Negative for chest pain.  Gastrointestinal:  Negative for abdominal pain and bowel incontinence.  Genitourinary:  Negative for bladder incontinence.  Musculoskeletal:  Positive for back  pain. Negative for arthralgias, gait problem and joint swelling.  Neurological:  Negative for dizziness, weakness, numbness and paresthesias.    Patient Active Problem List   Diagnosis Date Noted   Dysuria 10/11/2021   Pulmonary hypertension ([place]) 06/18/2021   Constipation 05/22/2021   Rectocele 05/22/2021   Dermatochalasis of both upper eyelids 07/06/2020   Ptosis of both eyebrows 07/06/2020   Aortic atherosclerosis ([place]) 04/16/2020   Arthralgia of right side of pelvis 04/14/2020   Dyspnea 04/14/2020   Palpitation 04/14/2020   History of colon polyps 10/05/2019   Pancreatic cyst 10/05/2019   GERD (gastroesophageal reflux disease) 10/12/2013   Major depressive disorder, single episode, unspecified 10/12/2013    Allergies  Allergen Reactions   Azithromycin Other (See Comments)    SWELLING/EDEMA   Paroxetine Hcl Nausea Only and Other (See Comments)    GI Upset   Ciprofloxacin Itching    Phlebitis    Sodium Clavulanate Diarrhea   Codeine Nausea And Vomiting    Past Surgical History:  Procedure Laterality Date   ACHILLES TENDON REPAIR  2008   ARTHRODESIS METATARSALPHALANGEAL JOINT (MTPJ) Left 10/18/2020   Procedure: ARTHRODESIS METATARSALPHALANGEAL JOINT (MTPJ);  Surgeon: Samara Deist, DPM;  Location: [facility];  Service: Podiatry;  Laterality: Left;  Anesthesia- General with local   BIOPSY  10/21/2019   Procedure: BIOPSY;  Surgeon: Milus Banister, MD;  [facility];  Service: Endoscopy;;  BOTOX INJECTION N/A 06/26/2020   Procedure: BOTOX INJECTION into internal anal sphincter muscles;  Surgeon: Fredirick Maudlin, MD;  Location: [facility];  Service: General;  Laterality: N/A;  Provider requesting 30 minutes for procedure   BREAST CYST ASPIRATION Right 2010   CATARACT EXTRACTION W/PHACO Right 12/21/2019   Procedure: CATARACT EXTRACTION PHACO AND INTRAOCULAR LENS PLACEMENT ([place]) RIGHT 4.15 00:31.2;  Surgeon: Birder Robson, MD;  Location: [facility];  Service: Ophthalmology;  Laterality: Right;   CATARACT EXTRACTION W/PHACO Left 01/11/2020   Procedure: CATARACT EXTRACTION PHACO AND INTRAOCULAR LENS PLACEMENT ([facility]) LEFT 4.78 00:26.5;  Surgeon: Birder Robson, MD;  Location: [facility];  Service: Ophthalmology;  Laterality: Left;   COLONOSCOPY WITH PROPOFOL N/A 09/13/2019   Procedure: COLONOSCOPY WITH PROPOFOL;  Surgeon: Jonathon Bellows, MD;  [facility];  Service: Gastroenterology;  Laterality: N/A;   ESOPHAGOGASTRODUODENOSCOPY (EGD) WITH PROPOFOL N/A 10/21/2019   Procedure: ESOPHAGOGASTRODUODENOSCOPY (EGD) WITH PROPOFOL;  Surgeon: Milus Banister, MD;  [facility];  Service: Endoscopy;  Laterality: N/A;   EUS N/A 10/21/2019   Procedure: UPPER ENDOSCOPIC ULTRASOUND (EUS) RADIAL;  Surgeon: Milus Banister, MD;  [facility];  Service: Endoscopy;  Laterality: N/A;   FINE NEEDLE ASPIRATION N/A 10/21/2019   Procedure: FINE NEEDLE ASPIRATION (FNA) LINEAR;  Surgeon: Milus Banister, MD;  [facility];  Service: Endoscopy;  Laterality: N/A;   HEMORRHOID SURGERY N/A 06/26/2020   Procedure: HEMORRHOIDECTOMY;  Surgeon: Fredirick Maudlin, MD;  Location: [facility];  Service: General;  Laterality: N/A;    Social History   Tobacco Use   Smoking status: Former    Packs/day: 0.50    Years: 10.00    Total pack years: 5.00    Types: Cigarettes    Quit date: 2002    Years since quitting: 21.8    Passive exposure: Past   Smokeless tobacco: Never  Vaping Use   Vaping Use: Never used  Substance Use Topics   Alcohol use: Yes    Comment: socially   Drug use: Never     Medication list has been reviewed and updated.  Current Meds  Medication Sig   albuterol (VENTOLIN HFA) 108 (90 Base) MCG/ACT inhaler Inhale 1-2 puffs into the lungs every 6 (six) hours as needed.   atorvastatin (LIPITOR) 10 MG tablet Take 1 tablet by mouth daily   BIOTIN PO Take 1,000 mg by mouth daily.   Cholecalciferol  (VITAMIN D-3) 125 MCG (5000 UT) TABS Take 125 mcg by mouth daily.   citalopram (CELEXA) 20 MG tablet Take 1 tablet by mouth once daily   fluticasone furoate-vilanterol (BREO ELLIPTA) 100-25 MCG/ACT AEPB Inhale 1 puff into the lungs daily.   folic acid (FOLVITE) 130 MCG tablet Take 800 mcg by mouth daily.   Lactobacillus (ACIDOPHILUS) CAPS capsule Take 1 capsule by mouth daily.   meloxicam (MOBIC) 15 MG tablet Take 1 tablet (15 mg total) by mouth daily.   Omega-3 Fatty Acids (FISH OIL PO) Take 1 capsule by mouth daily.   omeprazole (PRILOSEC) 20 MG capsule TAKE 1 CAPSULE BY MOUTH TWICE DAILY BEFORE A MEAL   traZODone (DESYREL) 100 MG tablet Take 1 tablet (100 mg total) by mouth at bedtime as needed. for sleep       10/11/2021   10:00 AM 04/25/2021    9:19 AM 10/11/2020   11:15 AM 07/26/2020    8:07 AM  GAD 7 : Generalized Anxiety Score  Nervous, Anxious, on Edge 1 0 1 0  Control/stop worrying  1 0 0 0  Worry too much - different things 1 0 1 0  Trouble relaxing 1 0 1 0  Restless 1 0 0 0  Easily annoyed or irritable 1 0 1 0  Afraid - awful might happen 0 0 0 0  Total GAD 7 Score 6 0 4 0  Anxiety Difficulty Somewhat difficult Not difficult at all Not difficult at all        10/11/2021   10:00 AM 04/25/2021    9:19 AM 10/11/2020   11:16 AM  Depression screen PHQ 2/9  Decreased Interest 1 0 0  Down, Depressed, Hopeless 1 0 0  PHQ - 2 Score 2 0 0  Altered sleeping _0 Tired, decreased energy _1 Change in appetite 1 0 1  Feeling bad or failure about yourself  1 0 0  Trouble concentrating 1 0 0  Moving slowly or fidgety/restless 1 0 0  Suicidal thoughts 0 0 0  PHQ-9 Score _2 Difficult doing work/chores Somewhat difficult Not difficult at all Not difficult at all    BP Readings from Last 3 Encounters:  01/14/22 128/74  10/29/21 (!) 145/72  10/11/21 118/80    Physical Exam Vitals and nursing note reviewed.  Constitutional:      General: She is not in acute  distress.    Appearance: Normal appearance. She is well-developed.  HENT:     Head: Normocephalic and atraumatic.  Cardiovascular:     Rate and Rhythm: Normal rate and regular rhythm.  Pulmonary:     Effort: Pulmonary effort is normal. No respiratory distress.     Breath sounds: No wheezing or rhonchi.  Abdominal:     Tenderness: There is no right CVA tenderness or left CVA tenderness.  Musculoskeletal:     Lumbar back: Tenderness (over right SI region and right lateral hip) present. Negative right straight leg raise test and negative left straight leg raise test.  Skin:    General: Skin is warm and dry.     Findings: No rash.  Neurological:     Mental Status: She is alert and oriented to person, place, and time.  Psychiatric:        Mood and Affect: Mood normal.        Behavior: Behavior normal.     Wt Readings from Last 3 Encounters:  01/14/22 161 lb 3.2 oz (73.1 kg)  10/29/21 161 lb (73 kg)  10/11/21 161 lb 3.2 oz (73.1 kg)    BP 128/74   Pulse 73   Ht _3  (1.676 m)   Wt 161 lb 3.2 oz (73.1 kg)   SpO2 97%   BMI 26.02 kg/m   Assessment and Plan: 1. Sacroiliac joint pain UA unremarkable Will treat with continued Advil and heat; add robaxin and steroid taper to be started if needed. - methocarbamol (ROBAXIN) 500 MG tablet; Take 1 tablet (500 mg total) by mouth 4 (four) times daily.  Dispense: 60 tablet; Refill: 0 - predniSONE (DELTASONE) 10 MG tablet; Take 1 tablet (10 mg total) by mouth as directed for 6 days. Take 6,5,4,3,2,1 then stop  Dispense: 21 tablet; Refill: 0   Partially dictated using Editor, commissioning. Any errors are unintentional.  Halina Maidens, MD [facility]  01/14/2022

## 2022-01-15 NOTE — Telephone Encounter (Signed)
Last OV 01/14/22, and future OV scheduled. Will refill.  Requested Prescriptions  Pending Prescriptions Disp Refills  . citalopram (CELEXA) 20 MG tablet [Pharmacy Med Name: Citalopram Hydrobromide 20 MG Oral Tablet] 90 tablet 0    Sig: Take 1 tablet by mouth once daily     Psychiatry:  Antidepressants - SSRI Failed - 01/14/2022  9:37 AM      Failed - Valid encounter within last 6 months    Recent Outpatient Visits          Yesterday Sacroiliac joint pain   [place] [facility] at [facility], Jesse Sans, MD   3 months ago [place] [facility] at [place], Earley Abide, MD   8 months ago Annual physical exam   [facility] at [facility], Jesse Sans, MD   1 year ago Subacute sinusitis, unspecified location   [facility] at [facility], Jesse Sans, MD   1 year ago Major depressive disorder with single episode, in full remission [facility])   [place] [facility] at [facility], Jesse Sans, MD      Future Appointments            In 3 months Army Melia Jesse Sans, MD [place] and Sports Medicine at [facility], [place] - Completed PHQ-2 or PHQ-9 in the last 360 days

## 2022-02-13 ENCOUNTER — Encounter: Payer: Medicare HMO | Admitting: Internal Medicine

## 2022-02-14 NOTE — Progress Notes (Signed)
Error

## 2022-02-19 ENCOUNTER — Other Ambulatory Visit: Payer: Self-pay | Admitting: Internal Medicine

## 2022-02-19 DIAGNOSIS — F325 Major depressive disorder, single episode, in full remission: Secondary | ICD-10-CM

## 2022-02-19 NOTE — Telephone Encounter (Signed)
Requested Prescriptions  Pending Prescriptions Disp Refills   traZODone (DESYREL) 100 MG tablet [Pharmacy Med Name: traZODone HCl 100 MG Oral Tablet] 90 tablet 0    Sig: TAKE 1 TABLET BY MOUTH AT BEDTIME AS NEEDED FOR SLEEP     Psychiatry: Antidepressants - Serotonin Modulator Passed - 02/19/2022 12:56 PM      Passed - Completed PHQ-2 or PHQ-9 in the last 360 days      Passed - Valid encounter within last 6 months    Recent Outpatient Visits           1 month ago Sacroiliac joint pain   [place] [facility] at [facility], Jesse Sans, MD   4 months ago [place] [facility] at [place], Earley Abide, MD   10 months ago Annual physical exam   [facility] at [facility], Jesse Sans, MD   1 year ago Subacute sinusitis, unspecified location   [facility] Medicine at [facility], Jesse Sans, MD   1 year ago Major depressive disorder with single episode, in full remission [facility])   [place] [facility] Medicine at [facility], Jesse Sans, MD       Future Appointments             In 2 months Army Melia, Jesse Sans, MD [place] and Sports Medicine at [facility], [facility]

## 2022-04-05 ENCOUNTER — Ambulatory Visit (INDEPENDENT_AMBULATORY_CARE_PROVIDER_SITE_OTHER): Payer: Medicare HMO

## 2022-04-05 NOTE — Progress Notes (Deleted)
I connected with  Claudia Gutierrez on 04/05/22 by a audio enabled telemedicine application and verified that I am speaking with the correct person using two identifiers.  Patient Location: Home  Provider Location: Office/Clinic  I discussed the limitations of evaluation and management by telemedicine. The patient expressed understanding and agreed to proceed.   Subjective:   Claudia Gutierrez is a 72 y.o. female who presents for Medicare Annual (Subsequent) preventive examination.  Review of Systems    Per HPI unless specifically indicated below.  Cardiac Risk Factors include: advanced age (>72mn, >>19women);female gender          Objective:    There were no vitals filed for this visit. There is no height or weight on file to calculate BMI.     11/11/2020    4:14 PM 10/18/2020   12:33 PM 06/26/2020   10:00 AM 06/22/2020    4:36 PM 01/11/2020   11:01 AM 12/21/2019    8:35 AM 10/21/2019    7:27 AM  Advanced Directives  Does Patient Have a Medical Advance Directive? No No No No No No No  Would patient like information on creating a medical advance directive?  No - Patient declined No - Patient declined  No - Patient declined Yes (MAU/Ambulatory/Procedural Areas - Information given) Yes (MAU/Ambulatory/Procedural Areas - Information given)    Current Medications (verified) Outpatient Encounter Medications as of 04/05/2022  Medication Sig   albuterol (VENTOLIN HFA) 108 (90 Base) MCG/ACT inhaler Inhale 1-2 puffs into the lungs every 6 (six) hours as needed.   atorvastatin (LIPITOR) 10 MG tablet Take 1 tablet by mouth daily   BIOTIN PO Take 1,000 mg by mouth daily.   Cholecalciferol (VITAMIN D-3) 125 MCG (5000 UT) TABS Take 125 mcg by mouth daily.   citalopram (CELEXA) 20 MG tablet Take 1 tablet by mouth once daily   fluticasone furoate-vilanterol (BREO ELLIPTA) 100-25 MCG/ACT AEPB Inhale 1 puff into the lungs daily.   folic acid (FOLVITE) 8381MCG tablet Take 800 mcg by mouth daily.    Lactobacillus (ACIDOPHILUS) CAPS capsule Take 1 capsule by mouth daily.   meloxicam (MOBIC) 15 MG tablet Take 1 tablet (15 mg total) by mouth daily.   methocarbamol (ROBAXIN) 500 MG tablet Take 1 tablet (500 mg total) by mouth 4 (four) times daily.   Omega-3 Fatty Acids (FISH OIL PO) Take 1 capsule by mouth daily.   omeprazole (PRILOSEC) 20 MG capsule TAKE 1 CAPSULE BY MOUTH TWICE DAILY BEFORE A MEAL   traZODone (DESYREL) 100 MG tablet TAKE 1 TABLET BY MOUTH AT BEDTIME AS NEEDED FOR SLEEP   No facility-administered encounter medications on file as of 04/05/2022.    Allergies (verified) Azithromycin, Paroxetine hcl, Ciprofloxacin, Sodium clavulanate, and Codeine   History: Past Medical History:  Diagnosis Date   Acid reflux    Anxiety    Complication of anesthesia    ponv   History of kidney stones    PONV (postoperative nausea and vomiting)    2002 colonoscopy   Wears dentures    Full upper   Past Surgical History:  Procedure Laterality Date   ACHILLES TENDON REPAIR  2008   ARTHRODESIS METATARSALPHALANGEAL JOINT (MTPJ) Left 10/18/2020   Procedure: ARTHRODESIS METATARSALPHALANGEAL JOINT (MTPJ);  Surgeon: FSamara Deist DPM;  [place]  Service: Podiatry;  Laterality: Left;  Anesthesia- General with local   BIOPSY  10/21/2019   Procedure: BIOPSY;  Surgeon: JMilus Banister MD;  [facility];  Service:  Endoscopy;;   BOTOX INJECTION N/A 06/26/2020   Procedure: BOTOX INJECTION into internal anal sphincter muscles;  Surgeon: Fredirick Maudlin, MD;  Location: [facility];  Service: General;  Laterality: N/A;  Provider requesting 30 minutes for procedure   BREAST CYST ASPIRATION Right 2010   CATARACT EXTRACTION W/PHACO Right 12/21/2019   Procedure: CATARACT EXTRACTION PHACO AND INTRAOCULAR LENS PLACEMENT ([place]) RIGHT 4.15 00:31.2;  Surgeon: Birder Robson, MD;  Location: [place];  Service: Ophthalmology;  Laterality: Right;   CATARACT EXTRACTION  W/PHACO Left 01/11/2020   Procedure: CATARACT EXTRACTION PHACO AND INTRAOCULAR LENS PLACEMENT ([facility]) LEFT 4.78 00:26.5;  Surgeon: Birder Robson, MD;  Location: [place];  Service: Ophthalmology;  Laterality: Left;   COLONOSCOPY WITH PROPOFOL N/A 09/13/2019   Procedure: COLONOSCOPY WITH PROPOFOL;  Surgeon: Jonathon Bellows, MD;  Location: [facility];  Service: Gastroenterology;  Laterality: N/A;   ESOPHAGOGASTRODUODENOSCOPY (EGD) WITH PROPOFOL N/A 10/21/2019   Procedure: ESOPHAGOGASTRODUODENOSCOPY (EGD) WITH PROPOFOL;  Surgeon: Milus Banister, MD;  [facility];  Service: Endoscopy;  Laterality: N/A;   EUS N/A 10/21/2019   Procedure: UPPER ENDOSCOPIC ULTRASOUND (EUS) RADIAL;  Surgeon: Milus Banister, MD;  [facility];  Service: Endoscopy;  Laterality: N/A;   FINE NEEDLE ASPIRATION N/A 10/21/2019   Procedure: FINE NEEDLE ASPIRATION (FNA) LINEAR;  Surgeon: Milus Banister, MD;  [facility];  Service: Endoscopy;  Laterality: N/A;   HEMORRHOID SURGERY N/A 06/26/2020   Procedure: HEMORRHOIDECTOMY;  Surgeon: Fredirick Maudlin, MD;  Location: [facility];  Service: General;  Laterality: N/A;   Family History  Problem Relation Age of Onset   Breast cancer Sister 60       and stomach ca   Diabetes Sister    Colon cancer Sister 7   Lung cancer Father    Diabetes Father    Heart disease Father    Hypertension Father    Stroke Paternal Grandmother    Social History   Socioeconomic History   Marital status: Significant Other    Spouse name: Not on file   Number of children: Not on file   Years of education: Not on file   Highest education level: Not on file  Occupational History   Not on file  Tobacco Use   Smoking status: Former    Packs/day: 0.50    Years: 10.00    Total pack years: 5.00    Types: Cigarettes    Quit date: 2002    Years since quitting: 22.0    Passive exposure: Past   Smokeless tobacco: Never  Vaping Use   Vaping Use: Never  used  Substance and Sexual Activity   Alcohol use: Yes    Comment: socially   Drug use: Never   Sexual activity: Not Currently  Other Topics Concern   Not on file  Social History Narrative   Not on file   Social Determinants of Health   Financial Resource Strain: Not on file  Food Insecurity: Not on file  Transportation Needs: Not on file  Physical Activity: Not on file  Stress: Not on file  Social Connections: Not on file    Tobacco Counseling Counseling given: Not Answered   Clinical Intake:                 Diabetic?No         Activities of Daily Living    04/25/2021    9:19 AM  In your present state of health, do you have any difficulty performing the following  activities:  Hearing? 0  Vision? 0  Difficulty concentrating or making decisions? 0  Walking or climbing stairs? 0  Dressing or bathing? 0  Doing errands, shopping? 0    Patient Care Team: Glean Hess, MD as PCP - General (Internal Medicine) Jonathon Bellows, MD as Consulting Physician (Gastroenterology) Milus Banister, MD as Attending Physician (Gastroenterology)  Indicate any recent Medical Services you may have received from other than [facility] providers in the past year (date may be approximate). The pt was seen at [facility] on 10/29/2021 for a Anal Fissure.    Assessment:   This is a routine wellness examination for [place].  Hearing/Vision screen No results found.  Dietary issues and exercise activities discussed:     Goals Addressed   None    Depression Screen    01/14/2022   11:32 AM 10/11/2021   10:00 AM 04/25/2021    9:19 AM 10/11/2020   11:16 AM 10/11/2020   11:15 AM 07/26/2020    8:07 AM 05/03/2020   10:36 AM  PHQ 2/9 Scores  PHQ - 2 Score 4 2 0 0 0 0 4  PHQ- 9 Score '11 8 2 5 '$ 0 1 11    Fall Risk    01/14/2022   11:32 AM 10/11/2021   10:00 AM 04/25/2021    9:19 AM 10/11/2020   11:16 AM 07/26/2020    8:07 AM  Fall Risk   Falls in the  past year? 0 0 0 0 0  Number falls in past yr: 0  0 0   Injury with Fall? 0 0 0 0   Risk for fall due to : No Fall Risks  No Fall Risks    Follow up Falls evaluation completed  Falls evaluation completed Falls evaluation completed Falls evaluation completed    [place]:  Any stairs in or around the home? {YES/NO:21197} If so, are there any without handrails? {YES/NO:21197} Home free of loose throw rugs in walkways, pet beds, electrical cords, etc? {YES/NO:21197} Adequate lighting in your home to reduce risk of falls? {YES/NO:21197}  ASSISTIVE DEVICES UTILIZED TO PREVENT FALLS:  Life alert? {YES/NO:21197} Use of a cane, walker or w/c? {YES/NO:21197} Grab bars in the bathroom? {YES/NO:21197} Shower chair or bench in shower? {YES/NO:21197} Elevated toilet seat or a handicapped toilet? {YES/NO:21197}  TIMED UP AND GO:  Was the test performed? {YES/NO:21197}.  Length of time to ambulate 10 feet: *** sec.   {Appearance of FOYD:7412878}  Cognitive Function:        Immunizations Immunization History  Administered Date(s) Administered   Fluad Quad(high Dose 65+) 02/07/2020, 01/14/2022   Influenza, Seasonal, Injecte, Preservative Fre 01/13/2007, 01/08/2010, 01/13/2013   Influenza,inj,Quad PF,6+ Mos 02/28/2017, 04/25/2021   Influenza-Unspecified 12/30/2013   Moderna Sars-Covid-2 Vaccination 05/15/2019, 06/11/2019   PNEUMOCOCCAL CONJUGATE-20 04/25/2021   Pneumococcal Conjugate-13 10/05/2019    TDAP status: Due, Education has been provided regarding the importance of this vaccine. Advised may receive this vaccine at local pharmacy or Health Dept. Aware to provide a copy of the vaccination record if obtained from local pharmacy or Health Dept. Verbalized acceptance and understanding.  Flu Vaccine status: Up to date  Pneumococcal vaccine status: Up to date  Covid-19 vaccine status: Information provided on how to obtain vaccines.   Qualifies  for Shingles Vaccine? Yes   Zostavax completed No   Shingrix Completed?: No.    Education has been provided regarding the importance of this vaccine. Patient has been advised to  call insurance company to determine out of pocket expense if they have not yet received this vaccine. Advised may also receive vaccine at local pharmacy or Health Dept. Verbalized acceptance and understanding.  Screening Tests Health Maintenance  Topic Date Due   Medicare Annual Wellness (AWV)  Never done   DTaP/Tdap/Td (1 - Tdap) Never done   Zoster Vaccines- Shingrix (1 of 2) Never done   DEXA SCAN  Never done   COVID-19 Vaccine (3 - Moderna risk series) 07/09/2019   MAMMOGRAM  07/03/2022   COLONOSCOPY (Pts 45-36yr Insurance coverage will need to be confirmed)  09/12/2029   Pneumonia Vaccine 72 Years old  Completed   INFLUENZA VACCINE  Completed   Hepatitis C Screening  Completed   HPV VACCINES  Aged Out    Health Maintenance  Health Maintenance Due  Topic Date Due   Medicare Annual Wellness (AWV)  Never done   DTaP/Tdap/Td (1 - Tdap) Never done   Zoster Vaccines- Shingrix (1 of 2) Never done   DEXA SCAN  Never done   COVID-19 Vaccine (3 - Moderna risk series) 07/09/2019    Colorectal cancer screening: Type of screening: Colonoscopy. Completed 09/13/2019. Repeat every 10 years  Mammogram status: Completed 07/02/2021. Repeat every year  Bone Density status: Ordered ***. Pt provided with contact info and advised to call to schedule appt.  Lung Cancer Screening: (Low Dose CT Chest recommended if Age 72-80years, 30 pack-year currently smoking OR have quit w/in 15years.) does not qualify.   Lung Cancer Screening Referral: not applicable   Additional Screening:  Hepatitis C Screening: does qualify; Completed 04/25/2021  Vision Screening: Recommended annual ophthalmology exams for early detection of glaucoma and other disorders of the eye. Is the patient up to date with their annual eye exam?   {YES/NO:21197} Who is the provider or what is the name of the office in which the patient attends annual eye exams? *** If pt is not established with a provider, would they like to be referred to a provider to establish care? {YES/NO:21197}.   Dental Screening: Recommended annual dental exams for proper oral hygiene  Community Resource Referral / Chronic Care Management: CRR required this visit?  No   CCM required this visit?  No      Plan:     I have personally reviewed and noted the following in the patient's chart:   Medical and social history Use of alcohol, tobacco or illicit drugs  Current medications and supplements including opioid prescriptions. Patient is not currently taking opioid prescriptions. Functional ability and status Nutritional status Physical activity Advanced directives List of other physicians Hospitalizations, surgeries, and ER visits in previous 12 months Vitals Screenings to include cognitive, depression, and falls Referrals and appointments  In addition, I have reviewed and discussed with patient certain preventive protocols, quality metrics, and best practice recommendations. A written personalized care plan for preventive services as well as general preventive health recommendations were provided to patient.     Ms. GGossen, Thank you for taking time to come for your Medicare Wellness Visit. I appreciate your ongoing commitment to your health goals. Please review the following plan we discussed and let me know if I can assist you in the future.   These are the goals we discussed:  Goals   None     This is a list of the screening recommended for you and due dates:  Health Maintenance  Topic Date Due   DTaP/Tdap/Td vaccine (1 - Tdap) Never done  Zoster (Shingles) Vaccine (1 of 2) Never done   DEXA scan (bone density measurement)  Never done   COVID-19 Vaccine (3 - Moderna risk series) 07/09/2019   Mammogram  07/03/2022   Medicare Annual  Wellness Visit  04/06/2023   Colon Cancer Screening  09/12/2029   Pneumonia Vaccine  Completed   Flu Shot  Completed   Hepatitis C Screening: USPSTF Recommendation to screen - Ages 18-79 yo.  Completed   HPV Vaccine  Aged [address] [place]   04/05/2022   Nurse Notes: Approximately 30 minute Non-Face -To-Face Medicare Wellness Visit

## 2022-04-05 NOTE — Patient Instructions (Signed)

## 2022-04-05 NOTE — Progress Notes (Signed)
Attempted to contact the patient for her medicare Wellness Visit, no answer. I left a message on the voicemail to return my call.

## 2022-04-22 ENCOUNTER — Ambulatory Visit: Payer: Self-pay | Admitting: *Deleted

## 2022-04-22 ENCOUNTER — Ambulatory Visit (INDEPENDENT_AMBULATORY_CARE_PROVIDER_SITE_OTHER): Payer: Medicare HMO | Admitting: Family Medicine

## 2022-04-22 ENCOUNTER — Encounter: Payer: Self-pay | Admitting: Family Medicine

## 2022-04-22 VITALS — BP 110/76 | HR 76 | Ht 66.0 in | Wt 161.0 lb

## 2022-04-22 DIAGNOSIS — B3789 Other sites of candidiasis: Secondary | ICD-10-CM

## 2022-04-22 DIAGNOSIS — B958 Unspecified staphylococcus as the cause of diseases classified elsewhere: Secondary | ICD-10-CM

## 2022-04-22 MED ORDER — NYSTATIN 100000 UNIT/GM EX CREA: 1.0000 | TOPICAL_CREAM | Freq: Two times a day (BID) | CUTANEOUS | 2 refills | Status: DC

## 2022-04-22 MED ORDER — MUPIROCIN 2 % EX OINT: 1.0000 | TOPICAL_OINTMENT | Freq: Two times a day (BID) | CUTANEOUS | 0 refills | Status: DC

## 2022-04-22 MED ORDER — MUPIROCIN 2 % EX OINT: 1.0000 | TOPICAL_OINTMENT | Freq: Two times a day (BID) | CUTANEOUS | 2 refills | Status: DC

## 2022-04-22 MED ORDER — NYSTATIN 100000 UNIT/GM EX CREA: 1.0000 | TOPICAL_CREAM | Freq: Two times a day (BID) | CUTANEOUS | 0 refills | Status: DC

## 2022-04-22 NOTE — Telephone Encounter (Signed)
  Chief Complaint: Rash under both breasts right worse Symptoms: Itching and burning with redness Frequency: Started 3-4 days ago and it's  getting worse.   Ice packs not helping   Right breast is mildly sore. Pertinent Negatives: Patient denies Knowing the cause Disposition: '[]'$ ED /'[]'$ Urgent Care (no appt availability in office) / '[x]'$ Appointment(In office/virtual)/ '[]'$  [facility]/ '[]'$ Home Care/ '[]'$ Refused Recommended Disposition /'[]'$ [facility]/ '[]'$  Follow-up with PCP Additional Notes: No appts. With Dr. Army Melia so scheduled a same day with Dr. Otilio Miu for today at 3:00.

## 2022-04-22 NOTE — Progress Notes (Signed)
Date:  04/22/2022   Name:  Claudia Gutierrez   DOB:  08-May-1950   MRN:  [id number]   Chief Complaint: Rash (Under both breasts- itches and burns. X 3-4 days)  Rash This is a new problem. The current episode started in the past 7 days. The problem has been gradually worsening since onset. The affected locations include the chest. The rash is characterized by redness. Associated symptoms include congestion, diarrhea and rhinorrhea. Pertinent negatives include no fever, shortness of breath or sore throat.    Lab Results  Component Value Date   NA 140 04/25/2021   K 4.6 04/25/2021   CO2 24 04/25/2021   GLUCOSE 95 04/25/2021   BUN 14 04/25/2021   CREATININE 0.94 04/25/2021   CALCIUM 9.7 04/25/2021   EGFR 65 04/25/2021   GFRNONAA >60 11/11/2020   Lab Results  Component Value Date   CHOL 209 (H) 04/25/2021   HDL 131 04/25/2021   LDLCALC 63 04/25/2021   TRIG 91 04/25/2021   CHOLHDL 1.6 04/25/2021   Lab Results  Component Value Date   TSH 4.900 (H) 04/25/2021   No results found for: "HGBA1C" Lab Results  Component Value Date   WBC 5.3 04/25/2021   HGB 13.0 04/25/2021   HCT 40.4 04/25/2021   MCV 91 04/25/2021   PLT 184 04/25/2021   Lab Results  Component Value Date   ALT 14 04/25/2021   AST 25 04/25/2021   ALKPHOS 83 04/25/2021   BILITOT 0.5 04/25/2021   No results found for: "25OHVITD2", "25OHVITD3", "VD25OH"   Review of Systems  Constitutional:  Negative for chills, diaphoresis and fever.  HENT:  Positive for congestion and rhinorrhea. Negative for ear discharge, ear pain, sinus pressure, sneezing and sore throat.   Respiratory:  Negative for shortness of breath.   Cardiovascular:  Negative for chest pain.  Gastrointestinal:  Positive for diarrhea. Negative for abdominal pain, blood in stool, constipation and nausea.  Genitourinary:  Negative for dysuria, flank pain, frequency, hematuria, urgency and vaginal discharge.  Musculoskeletal:  Negative for arthralgias,  back pain and myalgias.  Skin:  Positive for rash.  Neurological:  Negative for dizziness, weakness and headaches.  Hematological:  Negative for adenopathy. Does not bruise/bleed easily.  Psychiatric/Behavioral:  Negative for dysphoric mood. The patient is not nervous/anxious.     Patient Active Problem List   Diagnosis Date Noted   Dysuria 10/11/2021   Pulmonary hypertension ([place]) 06/18/2021   Constipation 05/22/2021   Rectocele 05/22/2021   Dermatochalasis of both upper eyelids 07/06/2020   Ptosis of both eyebrows 07/06/2020   Aortic atherosclerosis ([place]) 04/16/2020   Arthralgia of right side of pelvis 04/14/2020   Dyspnea 04/14/2020   Palpitation 04/14/2020   History of colon polyps 10/05/2019   Pancreatic cyst 10/05/2019   GERD (gastroesophageal reflux disease) 10/12/2013   Major depressive disorder, single episode, unspecified 10/12/2013    Allergies  Allergen Reactions   Azithromycin Other (See Comments)    SWELLING/EDEMA   Paroxetine Hcl Nausea Only and Other (See Comments)    GI Upset   Ciprofloxacin Itching    Phlebitis    Sodium Clavulanate Diarrhea   Codeine Nausea And Vomiting    Past Surgical History:  Procedure Laterality Date   ACHILLES TENDON REPAIR  2008   ARTHRODESIS METATARSALPHALANGEAL JOINT (MTPJ) Left 10/18/2020   Procedure: ARTHRODESIS METATARSALPHALANGEAL JOINT (MTPJ);  Surgeon: Samara Deist, DPM;  Location: [place];  Service: Podiatry;  Laterality: Left;  Anesthesia- General with local  BIOPSY  10/21/2019   Procedure: BIOPSY;  Surgeon: Milus Banister, MD;  Location: [facility];  Service: Endoscopy;;   BOTOX INJECTION N/A 06/26/2020   Procedure: BOTOX INJECTION into internal anal sphincter muscles;  Surgeon: Fredirick Maudlin, MD;  Location: [facility];  Service: General;  Laterality: N/A;  Provider requesting 30 minutes for procedure   BREAST CYST ASPIRATION Right 2010   CATARACT EXTRACTION W/PHACO Right 12/21/2019   Procedure:  CATARACT EXTRACTION PHACO AND INTRAOCULAR LENS PLACEMENT ([place]) RIGHT 4.15 00:31.2;  Surgeon: Birder Robson, MD;  Location: [place];  Service: Ophthalmology;  Laterality: Right;   CATARACT EXTRACTION W/PHACO Left 01/11/2020   Procedure: CATARACT EXTRACTION PHACO AND INTRAOCULAR LENS PLACEMENT ([facility]) LEFT 4.78 00:26.5;  Surgeon: Birder Robson, MD;  Location: [place];  Service: Ophthalmology;  Laterality: Left;   COLONOSCOPY WITH PROPOFOL N/A 09/13/2019   Procedure: COLONOSCOPY WITH PROPOFOL;  Surgeon: Jonathon Bellows, MD;  Location: [facility];  Service: Gastroenterology;  Laterality: N/A;   ESOPHAGOGASTRODUODENOSCOPY (EGD) WITH PROPOFOL N/A 10/21/2019   Procedure: ESOPHAGOGASTRODUODENOSCOPY (EGD) WITH PROPOFOL;  Surgeon: Milus Banister, MD;  [facility];  Service: Endoscopy;  Laterality: N/A;   EUS N/A 10/21/2019   Procedure: UPPER ENDOSCOPIC ULTRASOUND (EUS) RADIAL;  Surgeon: Milus Banister, MD;  [facility];  Service: Endoscopy;  Laterality: N/A;   FINE NEEDLE ASPIRATION N/A 10/21/2019   Procedure: FINE NEEDLE ASPIRATION (FNA) LINEAR;  Surgeon: Milus Banister, MD;  [facility];  Service: Endoscopy;  Laterality: N/A;   HEMORRHOID SURGERY N/A 06/26/2020   Procedure: HEMORRHOIDECTOMY;  Surgeon: Fredirick Maudlin, MD;  Location: [facility];  Service: General;  Laterality: N/A;    Social History   Tobacco Use   Smoking status: Former    Packs/day: 0.50    Years: 10.00    Total pack years: 5.00    Types: Cigarettes    Quit date: 2002    Years since quitting: 22.0    Passive exposure: Past   Smokeless tobacco: Never  Vaping Use   Vaping Use: Never used  Substance Use Topics   Alcohol use: Yes    Comment: socially   Drug use: Never     Medication list has been reviewed and updated.  Current Meds  Medication Sig   atorvastatin (LIPITOR) 10 MG tablet Take 1 tablet by mouth daily   BIOTIN PO Take 1,000 mg by mouth daily.    Cholecalciferol (VITAMIN D-3) 125 MCG (5000 UT) TABS Take 125 mcg by mouth daily.   citalopram (CELEXA) 20 MG tablet Take 1 tablet by mouth once daily   fluticasone furoate-vilanterol (BREO ELLIPTA) 100-25 MCG/ACT AEPB Inhale 1 puff into the lungs daily.   folic acid (FOLVITE) 308 MCG tablet Take 800 mcg by mouth daily.   Omega-3 Fatty Acids (FISH OIL PO) Take 1 capsule by mouth daily.   omeprazole (PRILOSEC) 20 MG capsule TAKE 1 CAPSULE BY MOUTH TWICE DAILY BEFORE A MEAL   traZODone (DESYREL) 100 MG tablet TAKE 1 TABLET BY MOUTH AT BEDTIME AS NEEDED FOR SLEEP       04/22/2022    3:00 PM 01/14/2022   11:32 AM 10/11/2021   10:00 AM 04/25/2021    9:19 AM  GAD 7 : Generalized Anxiety Score  Nervous, Anxious, on Edge 0 1 1 0  Control/stop worrying 0 0 1 0  Worry too much - different things 0 1 1 0  Trouble relaxing 0 1 1 0  Restless 0 1 1 0  Easily annoyed  or irritable 0 1 1 0  Afraid - awful might happen 0 0 0 0  Total GAD 7 Score 0 5 6 0  Anxiety Difficulty Not difficult at all Somewhat difficult Somewhat difficult Not difficult at all       04/22/2022    3:00 PM 01/14/2022   11:32 AM 10/11/2021   10:00 AM  Depression screen PHQ 2/9  Decreased Interest 0 2 1  Down, Depressed, Hopeless 0 2 1  PHQ - 2 Score 0 4 2  Altered sleeping 0 2 1  Tired, decreased energy 0 2 1  Change in appetite 0 2 1  Feeling bad or failure about yourself  0 0 1  Trouble concentrating 0 0 1  Moving slowly or fidgety/restless 0 1 1  Suicidal thoughts 0 0 0  PHQ-9 Score 0 11 8  Difficult doing work/chores Not difficult at all Not difficult at all Somewhat difficult    BP Readings from Last 3 Encounters:  04/22/22 110/76  01/14/22 128/74  10/29/21 (!) 145/72    Physical Exam Vitals and nursing note reviewed.  HENT:     Right Ear: Tympanic membrane normal.     Left Ear: Tympanic membrane normal.     Nose: Congestion present.     Mouth/Throat:     Mouth: Mucous membranes are moist.   Cardiovascular:     Heart sounds: No murmur heard.    No friction rub. No gallop.  Musculoskeletal:     Cervical back: Normal range of motion.  Skin:    Findings: Erythema and rash present. Rash is macular.     Comments: Macular rash beneath both breasts     Wt Readings from Last 3 Encounters:  04/22/22 161 lb (73 kg)  01/14/22 161 lb 3.2 oz (73.1 kg)  10/29/21 161 lb (73 kg)    BP 110/76   Pulse 76   Ht '5\' 6"'$  (1.676 m)   Wt 161 lb (73 kg)   SpO2 96%   BMI 25.99 kg/m   Assessment and Plan:  1. Candidiasis of breast New onset.  Persistent.  Stable.  Erythematous pruritic rash beneath both breasts consistent with candidiasis.  Patient has been having sweating with the unseasonably warmth and sweating associated.  This is likely of a candidiasis nature and will treat with nystatin cream apply twice a day coupled with Bactroban ointment. - nystatin cream (MYCOSTATIN); Apply 1 Application topically 2 (two) times daily.  Dispense: 30 g; Refill: 2  2. Staph infection New onset.  Secondary infection of the above yeast infection with erythema and pruritus consistent with secondary bacterial infection likely staph we will treat with Bactroban ointment applied twice a day along with nystatin cream - mupirocin ointment (BACTROBAN) 2 %; Apply 1 Application topically 2 (two) times daily.  Dispense: 22 g; Refill: 2    Otilio Miu, MD

## 2022-04-22 NOTE — Telephone Encounter (Signed)
Reason for Disposition  [1] Localized rash is very painful AND [2] no fever    Under both breasts a rash.   Right worse  Answer Assessment - Initial Assessment Questions 1. APPEARANCE of RASH: "Describe the rash."      I have a rash under my breasts right one is worse.   It itches and burns. 2. LOCATION: "Where is the rash located?"      Under my breasts  Started 3-4 days ago.    I've tried cold compresses. 3. NUMBER: "How many spots are there?"      It's red spots 4. SIZE: "How big are the spots?" (Inches, centimeters or compare to size of a coin)      Not asked 5. ONSET: "When did the rash start?"      See above 6. ITCHING: "Does the rash itch?" If Yes, ask: "How bad is the itch?"  (Scale 0-10; or none, mild, moderate, severe)     Yes 7. PAIN: "Does the rash hurt?" If Yes, ask: "How bad is the pain?"  (Scale 0-10; or none, mild, moderate, severe)    - NONE (0): no pain    - MILD (1-3): doesn't interfere with normal activities     - MODERATE (4-7): interferes with normal activities or awakens from sleep     - SEVERE (8-10): excruciating pain, unable to do any normal activities     Burning 8. OTHER SYMPTOMS: "Do you have any other symptoms?" (e.g., fever)     My right breast is sore 9. PREGNANCY: "Is there any chance you are pregnant?" "When was your last menstrual period?"     N/A  Protocols used: Rash or Redness - Localized-A-AH

## 2022-04-25 ENCOUNTER — Ambulatory Visit: Payer: Medicare HMO | Admitting: Internal Medicine

## 2022-04-29 ENCOUNTER — Encounter: Payer: Self-pay | Admitting: Internal Medicine

## 2022-04-29 ENCOUNTER — Encounter: Payer: Medicare HMO | Admitting: Internal Medicine

## 2022-04-29 NOTE — Assessment & Plan Note (Deleted)
Tolerating statin medication without side effects at this time LDL is  Lab Results  Component Value Date   LDLCALC 63 04/25/2021   with a goal of < 70. Current dose is appropriate.

## 2022-04-29 NOTE — Progress Notes (Deleted)
Date:  04/29/2022   Name:  Claudia Gutierrez   DOB:  July 09, 1950   MRN:  [id number]   Chief Complaint: No chief complaint on file. Claudia Gutierrez is a 72 y.o. female who presents today for her Complete Annual Exam. She feels {DESC; WELL/FAIRLY WELL/POORLY:18703}. She reports exercising ***. She reports she is sleeping {DESC; WELL/FAIRLY WELL/POORLY:18703}. Breast complaints ***.  Mammogram: 4//2023 DEXA: none Pap smear: discontinued Colonoscopy: 08/2019  Health Maintenance Due  Topic Date Due   Medicare Annual Wellness (AWV)  Never done   DTaP/Tdap/Td (1 - Tdap) Never done   Zoster Vaccines- Shingrix (1 of 2) Never done   DEXA SCAN  Never done   COVID-19 Vaccine (3 - Moderna risk series) 07/09/2019    Immunization History  Administered Date(s) Administered   Fluad Quad(high Dose 65+) 02/07/2020, 01/14/2022   Influenza, Seasonal, Injecte, Preservative Fre 01/13/2007, 01/08/2010, 01/13/2013   Influenza,inj,Quad PF,6+ Mos 02/28/2017, 04/25/2021   Influenza-Unspecified 12/30/2013   Moderna Sars-Covid-2 Vaccination 05/15/2019, 06/11/2019   PNEUMOCOCCAL CONJUGATE-20 04/25/2021   Pneumococcal Conjugate-13 10/05/2019    Gastroesophageal Reflux She complains of heartburn. She reports no abdominal pain, no chest pain, no coughing or no wheezing. This is a recurrent problem. The problem occurs rarely. Pertinent negatives include no fatigue. She has tried a PPI for the symptoms.  Depression        This is a chronic problem.  The problem has been resolved since onset.  Associated symptoms include no fatigue and no headaches.  Past treatments include SSRIs - Selective serotonin reuptake inhibitors and other medications (citalopram and trazodone).   Lab Results  Component Value Date   NA 140 04/25/2021   K 4.6 04/25/2021   CO2 24 04/25/2021   GLUCOSE 95 04/25/2021   BUN 14 04/25/2021   CREATININE 0.94 04/25/2021   CALCIUM 9.7 04/25/2021   EGFR 65 04/25/2021   GFRNONAA >60 11/11/2020    Lab Results  Component Value Date   CHOL 209 (H) 04/25/2021   HDL 131 04/25/2021   LDLCALC 63 04/25/2021   TRIG 91 04/25/2021   CHOLHDL 1.6 04/25/2021   Lab Results  Component Value Date   TSH 4.900 (H) 04/25/2021   No results found for: "HGBA1C" Lab Results  Component Value Date   WBC 5.3 04/25/2021   HGB 13.0 04/25/2021   HCT 40.4 04/25/2021   MCV 91 04/25/2021   PLT 184 04/25/2021   Lab Results  Component Value Date   ALT 14 04/25/2021   AST 25 04/25/2021   ALKPHOS 83 04/25/2021   BILITOT 0.5 04/25/2021   No results found for: "25OHVITD2", "25OHVITD3", "VD25OH"   Review of Systems  Constitutional:  Negative for chills, fatigue and fever.  HENT:  Negative for congestion, hearing loss, tinnitus, trouble swallowing and voice change.   Eyes:  Negative for visual disturbance.  Respiratory:  Negative for cough, chest tightness, shortness of breath and wheezing.   Cardiovascular:  Negative for chest pain, palpitations and leg swelling.  Gastrointestinal:  Positive for heartburn. Negative for abdominal pain, constipation, diarrhea and vomiting.  Endocrine: Negative for polydipsia and polyuria.  Genitourinary:  Negative for dysuria, frequency, genital sores, vaginal bleeding and vaginal discharge.  Musculoskeletal:  Negative for arthralgias, gait problem and joint swelling.  Skin:  Negative for color change and rash.  Neurological:  Negative for dizziness, tremors, light-headedness and headaches.  Hematological:  Negative for adenopathy. Does not bruise/bleed easily.  Psychiatric/Behavioral:  Positive for depression. Negative for dysphoric mood and sleep  disturbance. The patient is not nervous/anxious.     Patient Active Problem List   Diagnosis Date Noted   Dysuria 10/11/2021   Pulmonary hypertension ([place]) 06/18/2021   Constipation 05/22/2021   Rectocele 05/22/2021   Dermatochalasis of both upper eyelids 07/06/2020   Ptosis of both eyebrows 07/06/2020   Aortic  atherosclerosis ([place]) 04/16/2020   Arthralgia of right side of pelvis 04/14/2020   Dyspnea 04/14/2020   Palpitation 04/14/2020   History of colon polyps 10/05/2019   Pancreatic cyst 10/05/2019   GERD (gastroesophageal reflux disease) 10/12/2013   Major depressive disorder, single episode, unspecified 10/12/2013    Allergies  Allergen Reactions   Azithromycin Other (See Comments)    SWELLING/EDEMA   Paroxetine Hcl Nausea Only and Other (See Comments)    GI Upset   Ciprofloxacin Itching    Phlebitis    Sodium Clavulanate Diarrhea   Codeine Nausea And Vomiting    Past Surgical History:  Procedure Laterality Date   ACHILLES TENDON REPAIR  2008   ARTHRODESIS METATARSALPHALANGEAL JOINT (MTPJ) Left 10/18/2020   Procedure: ARTHRODESIS METATARSALPHALANGEAL JOINT (MTPJ);  Surgeon: Samara Deist, DPM;  Location: [facility];  Service: Podiatry;  Laterality: Left;  Anesthesia- General with local   BIOPSY  10/21/2019   Procedure: BIOPSY;  Surgeon: Milus Banister, MD;  Location: [facility];  Service: Endoscopy;;   BOTOX INJECTION N/A 06/26/2020   Procedure: BOTOX INJECTION into internal anal sphincter muscles;  Surgeon: Fredirick Maudlin, MD;  Location: [facility];  Service: General;  Laterality: N/A;  Provider requesting 30 minutes for procedure   BREAST CYST ASPIRATION Right 2010   CATARACT EXTRACTION W/PHACO Right 12/21/2019   Procedure: CATARACT EXTRACTION PHACO AND INTRAOCULAR LENS PLACEMENT ([place]) RIGHT 4.15 00:31.2;  Surgeon: Birder Robson, MD;  Location: [facility];  Service: Ophthalmology;  Laterality: Right;   CATARACT EXTRACTION W/PHACO Left 01/11/2020   Procedure: CATARACT EXTRACTION PHACO AND INTRAOCULAR LENS PLACEMENT ([facility]) LEFT 4.78 00:26.5;  Surgeon: Birder Robson, MD;  Location: [facility];  Service: Ophthalmology;  Laterality: Left;   COLONOSCOPY WITH PROPOFOL N/A 09/13/2019   Procedure: COLONOSCOPY WITH PROPOFOL;  Surgeon: Jonathon Bellows, MD;   Location[facility];  Service: Gastroenterology;  Laterality: N/A;   ESOPHAGOGASTRODUODENOSCOPY (EGD) WITH PROPOFOL N/A 10/21/2019   Procedure: ESOPHAGOGASTRODUODENOSCOPY (EGD) WITH PROPOFOL;  Surgeon: Milus Banister, MD;  Location: [facility];  Service: Endoscopy;  Laterality: N/A;   EUS N/A 10/21/2019   Procedure: UPPER ENDOSCOPIC ULTRASOUND (EUS) RADIAL;  Surgeon: Milus Banister, MD;  Location: [facility];  Service: Endoscopy;  Laterality: N/A;   FINE NEEDLE ASPIRATION N/A 10/21/2019   Procedure: FINE NEEDLE ASPIRATION (FNA) LINEAR;  Surgeon: Milus Banister, MD;  Location: [facility];  Service: Endoscopy;  Laterality: N/A;   HEMORRHOID SURGERY N/A 06/26/2020   Procedure: HEMORRHOIDECTOMY;  Surgeon: Fredirick Maudlin, MD;  Location: [facility];  Service: General;  Laterality: N/A;    Social History   Tobacco Use   Smoking status: Former    Packs/day: 0.50    Years: 10.00    Total pack years: 5.00    Types: Cigarettes    Quit date: 2002    Years since quitting: 22.0    Passive exposure: Past   Smokeless tobacco: Never  Vaping Use   Vaping Use: Never used  Substance Use Topics   Alcohol use: Yes    Comment: socially   Drug use: Never     Medication list has been reviewed and updated.  No outpatient medications have been  marked as taking for the 04/29/22 encounter (Appointment) with Glean Hess, MD.       04/22/2022    3:00 PM 01/14/2022   11:32 AM 10/11/2021   10:00 AM 04/25/2021    9:19 AM  GAD 7 : Generalized Anxiety Score  Nervous, Anxious, on Edge 0 1 1 0  Control/stop worrying 0 0 1 0  Worry too much - different things 0 1 1 0  Trouble relaxing 0 1 1 0  Restless 0 1 1 0  Easily annoyed or irritable 0 1 1 0  Afraid - awful might happen 0 0 0 0  Total GAD 7 Score 0 5 6 0  Anxiety Difficulty Not difficult at all Somewhat difficult Somewhat difficult Not difficult at all       04/22/2022    3:00 PM 01/14/2022   11:32 AM 10/11/2021   10:00 AM   Depression screen PHQ 2/9  Decreased Interest 0 2 1  Down, Depressed, Hopeless 0 2 1  PHQ - 2 Score 0 4 2  Altered sleeping 0 2 1  Tired, decreased energy 0 2 1  Change in appetite 0 2 1  Feeling bad or failure about yourself  0 0 1  Trouble concentrating 0 0 1  Moving slowly or fidgety/restless 0 1 1  Suicidal thoughts 0 0 0  PHQ-9 Score 0 11 8  Difficult doing work/chores Not difficult at all Not difficult at all Somewhat difficult    BP Readings from Last 3 Encounters:  04/22/22 110/76  01/14/22 128/74  10/29/21 (!) 145/72    Physical Exam Vitals and nursing note reviewed.  Constitutional:      General: She is not in acute distress.    Appearance: She is well-developed.  HENT:     Head: Normocephalic and atraumatic.     Right Ear: Tympanic membrane and ear canal normal.     Left Ear: Tympanic membrane and ear canal normal.     Nose:     Right Sinus: No maxillary sinus tenderness.     Left Sinus: No maxillary sinus tenderness.  Eyes:     General: No scleral icterus.       Right eye: No discharge.        Left eye: No discharge.     Conjunctiva/sclera: Conjunctivae normal.  Neck:     Thyroid: No thyromegaly.     Vascular: No carotid bruit.  Cardiovascular:     Rate and Rhythm: Normal rate and regular rhythm.     Pulses: Normal pulses.     Heart sounds: Normal heart sounds.  Pulmonary:     Effort: Pulmonary effort is normal. No respiratory distress.     Breath sounds: No wheezing.  Chest:  Breasts:    Right: No mass, nipple discharge, skin change or tenderness.     Left: No mass, nipple discharge, skin change or tenderness.  Abdominal:     General: Bowel sounds are normal.     Palpations: Abdomen is soft.     Tenderness: There is no abdominal tenderness.  Musculoskeletal:     Cervical back: Normal range of motion. No erythema.     Right lower leg: No edema.     Left lower leg: No edema.  Lymphadenopathy:     Cervical: No cervical adenopathy.  Skin:     General: Skin is warm and dry.     Capillary Refill: Capillary refill takes less than 2 seconds.     Findings: No rash.  Neurological:     Mental Status: She is alert and oriented to person, place, and time.     Cranial Nerves: No cranial nerve deficit.     Sensory: No sensory deficit.     Deep Tendon Reflexes: Reflexes are normal and symmetric.  Psychiatric:        Attention and Perception: Attention normal.        Mood and Affect: Mood normal.     Wt Readings from Last 3 Encounters:  04/22/22 161 lb (73 kg)  01/14/22 161 lb 3.2 oz (73.1 kg)  10/29/21 161 lb (73 kg)    There were no vitals taken for this visit.  Assessment and Plan:

## 2022-04-29 NOTE — Assessment & Plan Note (Deleted)
Clinically stable on current regimen with good control of symptoms, No SI or HI. No change in management at this time.

## 2022-04-29 NOTE — Assessment & Plan Note (Deleted)
Symptoms well controlled on daily PPI No red flag signs such as weight loss, n/v, melena Will continue omeprazole.

## 2022-05-02 ENCOUNTER — Telehealth: Payer: Self-pay | Admitting: Internal Medicine

## 2022-05-02 NOTE — Telephone Encounter (Signed)
Copied from [place] [id number]. Topic: Medicare AWV >> May 02, 2022 11:09 AM Devoria Glassing wrote: Reason for CRM: Left message tor patient to schedule Medicare Annual Wellness Visit (AWV) with [place], [place]  Appointment can be an offiice/telephone or virtual visit;  Please call [phone] ask for Juliann Pulse.

## 2022-05-08 ENCOUNTER — Telehealth: Payer: Self-pay | Admitting: Internal Medicine

## 2022-05-08 NOTE — Telephone Encounter (Signed)
Copied from [place] [id number]. Topic: Medicare AWV >> May 08, 2022  9:59 AM Devoria Glassing wrote: Reason for CRM: Left message tor patient to schedule Medicare Annual Wellness Visit (AWV) with [place], [place]  Appointment can be an offiice/telephone or virtual visit;  Please call [phone] ask for Juliann Pulse.

## 2022-05-14 ENCOUNTER — Other Ambulatory Visit: Payer: Self-pay | Admitting: Internal Medicine

## 2022-05-14 DIAGNOSIS — F325 Major depressive disorder, single episode, in full remission: Secondary | ICD-10-CM

## 2022-05-15 NOTE — Telephone Encounter (Signed)
Pt called to check the status of her refill request for Trazodone before she runs out of medication / please advise

## 2022-05-16 ENCOUNTER — Other Ambulatory Visit: Payer: Self-pay | Admitting: Internal Medicine

## 2022-05-16 DIAGNOSIS — F325 Major depressive disorder, single episode, in full remission: Secondary | ICD-10-CM

## 2022-05-16 MED ORDER — TRAZODONE HCL 100 MG PO TABS: 100.0000 mg | ORAL_TABLET | Freq: Every evening | ORAL | 3 refills | Status: DC | PRN

## 2022-05-17 ENCOUNTER — Other Ambulatory Visit: Payer: Self-pay | Admitting: Internal Medicine

## 2022-05-20 ENCOUNTER — Other Ambulatory Visit: Payer: Self-pay | Admitting: Internal Medicine

## 2022-05-29 ENCOUNTER — Other Ambulatory Visit: Payer: Self-pay | Admitting: Internal Medicine

## 2022-05-29 DIAGNOSIS — I700 Atherosclerosis of aorta: Secondary | ICD-10-CM

## 2022-06-06 DIAGNOSIS — M3501 Sicca syndrome with keratoconjunctivitis: Secondary | ICD-10-CM | POA: Diagnosis not present

## 2022-06-06 DIAGNOSIS — H02834 Dermatochalasis of left upper eyelid: Secondary | ICD-10-CM | POA: Diagnosis not present

## 2022-06-06 DIAGNOSIS — Z961 Presence of intraocular lens: Secondary | ICD-10-CM | POA: Diagnosis not present

## 2022-06-10 ENCOUNTER — Encounter: Payer: Medicare HMO | Admitting: Internal Medicine

## 2022-06-10 NOTE — Assessment & Plan Note (Deleted)
Noted on CT in 2021 On atorvastatin 10 mg without side effects Lab Results  Component Value Date   LDLCALC 63 04/25/2021

## 2022-06-10 NOTE — Assessment & Plan Note (Deleted)
Symptoms well controlled on daily PPI No red flag signs such as weight loss, n/v, melena  

## 2022-06-10 NOTE — Assessment & Plan Note (Deleted)
Clinically stable on current regimen with good control of symptoms, No SI or HI. No change in management at this time with Celexa and Trazodone.

## 2022-06-10 NOTE — Progress Notes (Deleted)
Date:  06/10/2022   Name:  Claudia Gutierrez   DOB:  01/07/1951   MRN:  [id number]   Chief Complaint: No chief complaint on file. Claudia Gutierrez is a 72 y.o. female who presents today for her Complete Annual Exam. She feels {DESC; WELL/FAIRLY WELL/POORLY:18703}. She reports exercising ***. She reports she is sleeping {DESC; WELL/FAIRLY WELL/POORLY:18703}. Breast complaints ***.  Mammogram: 06/2021 DEXA: none Colonoscopy: 08/2019 repeat 10 yrs  Health Maintenance Due  Topic Date Due   Medicare Annual Wellness (AWV)  Never done   DTaP/Tdap/Td (1 - Tdap) Never done   Zoster Vaccines- Shingrix (1 of 2) Never done   DEXA SCAN  Never done   COVID-19 Vaccine (3 - Moderna risk series) 07/09/2019    Immunization History  Administered Date(s) Administered   Fluad Quad(high Dose 65+) 02/07/2020, 01/14/2022   Influenza, Seasonal, Injecte, Preservative Fre 01/13/2007, 01/08/2010, 01/13/2013   Influenza,inj,Quad PF,6+ Mos 02/28/2017, 04/25/2021   Influenza-Unspecified 12/30/2013   Moderna Sars-Covid-2 Vaccination 05/15/2019, 06/11/2019   PNEUMOCOCCAL CONJUGATE-20 04/25/2021   Pneumococcal Conjugate-13 10/05/2019    Depression        This is a chronic problem.The problem is unchanged.  Associated symptoms include no fatigue and no headaches.  Past treatments include SSRIs - Selective serotonin reuptake inhibitors and other medications.  Compliance with treatment is good. Gastroesophageal Reflux She complains of heartburn. She reports no abdominal pain, no chest pain, no coughing or no wheezing. This is a recurrent problem. The problem occurs rarely. Pertinent negatives include no fatigue. She has tried a PPI for the symptoms.  Shortness of Breath This is a chronic problem. Pertinent negatives include no abdominal pain, chest pain, fever, headaches, leg swelling, rash, vomiting or wheezing. She has tried beta agonist inhalers and steroid inhalers for the symptoms. The treatment provided mild  relief. There is no history of CAD, chronic lung disease or COPD.    Lab Results  Component Value Date   NA 140 04/25/2021   K 4.6 04/25/2021   CO2 24 04/25/2021   GLUCOSE 95 04/25/2021   BUN 14 04/25/2021   CREATININE 0.94 04/25/2021   CALCIUM 9.7 04/25/2021   EGFR 65 04/25/2021   GFRNONAA >60 11/11/2020   Lab Results  Component Value Date   CHOL 209 (H) 04/25/2021   HDL 131 04/25/2021   LDLCALC 63 04/25/2021   TRIG 91 04/25/2021   CHOLHDL 1.6 04/25/2021   Lab Results  Component Value Date   TSH 4.900 (H) 04/25/2021   No results found for: "HGBA1C" Lab Results  Component Value Date   WBC 5.3 04/25/2021   HGB 13.0 04/25/2021   HCT 40.4 04/25/2021   MCV 91 04/25/2021   PLT 184 04/25/2021   Lab Results  Component Value Date   ALT 14 04/25/2021   AST 25 04/25/2021   ALKPHOS 83 04/25/2021   BILITOT 0.5 04/25/2021   No results found for: "25OHVITD2", "25OHVITD3", "VD25OH"   Review of Systems  Constitutional:  Negative for chills, fatigue and fever.  HENT:  Negative for congestion, hearing loss, tinnitus, trouble swallowing and voice change.   Eyes:  Negative for visual disturbance.  Respiratory:  Positive for shortness of breath. Negative for cough, chest tightness and wheezing.   Cardiovascular:  Negative for chest pain, palpitations and leg swelling.  Gastrointestinal:  Positive for heartburn. Negative for abdominal pain, constipation, diarrhea and vomiting.  Endocrine: Negative for polydipsia and polyuria.  Genitourinary:  Negative for dysuria, frequency, genital sores, vaginal bleeding and vaginal  discharge.  Musculoskeletal:  Negative for arthralgias, gait problem and joint swelling.  Skin:  Negative for color change and rash.  Neurological:  Negative for dizziness, tremors, light-headedness and headaches.  Hematological:  Negative for adenopathy. Does not bruise/bleed easily.  Psychiatric/Behavioral:  Positive for depression. Negative for dysphoric mood and  sleep disturbance. The patient is not nervous/anxious.     Patient Active Problem List   Diagnosis Date Noted   Pulmonary hypertension ([place]) 06/18/2021   Constipation 05/22/2021   Rectocele 05/22/2021   Dermatochalasis of both upper eyelids 07/06/2020   Ptosis of both eyebrows 07/06/2020   Aortic atherosclerosis ([place]) 04/16/2020   Arthralgia of right side of pelvis 04/14/2020   Dyspnea 04/14/2020   Palpitation 04/14/2020   History of colon polyps 10/05/2019   Pancreatic cyst 10/05/2019   GERD (gastroesophageal reflux disease) 10/12/2013   Major depressive disorder, single episode, unspecified 10/12/2013    Allergies  Allergen Reactions   Azithromycin Other (See Comments)    SWELLING/EDEMA   Paroxetine Hcl Nausea Only and Other (See Comments)    GI Upset   Ciprofloxacin Itching    Phlebitis    Sodium Clavulanate Diarrhea   Codeine Nausea And Vomiting    Past Surgical History:  Procedure Laterality Date   ACHILLES TENDON REPAIR  2008   ARTHRODESIS METATARSALPHALANGEAL JOINT (MTPJ) Left 10/18/2020   Procedure: ARTHRODESIS METATARSALPHALANGEAL JOINT (MTPJ);  Surgeon: Samara Deist, DPM;  Location: [place];  Service: Podiatry;  Laterality: Left;  Anesthesia- General with local   BIOPSY  10/21/2019   Procedure: BIOPSY;  Surgeon: Milus Banister, MD;  Location: [facility];  Service: Endoscopy;;   BOTOX INJECTION N/A 06/26/2020   Procedure: BOTOX INJECTION into internal anal sphincter muscles;  Surgeon: Fredirick Maudlin, MD;  Location: [facility];  Service: General;  Laterality: N/A;  Provider requesting 30 minutes for procedure   BREAST CYST ASPIRATION Right 2010   CATARACT EXTRACTION W/PHACO Right 12/21/2019   Procedure: CATARACT EXTRACTION PHACO AND INTRAOCULAR LENS PLACEMENT ([place]) RIGHT 4.15 00:31.2;  Surgeon: Birder Robson, MD;  Location: [place];  Service: Ophthalmology;  Laterality: Right;   CATARACT EXTRACTION W/PHACO Left 01/11/2020   Procedure:  CATARACT EXTRACTION PHACO AND INTRAOCULAR LENS PLACEMENT ([facility]) LEFT 4.78 00:26.5;  Surgeon: Birder Robson, MD;  Location: [place];  Service: Ophthalmology;  Laterality: Left;   COLONOSCOPY WITH PROPOFOL N/A 09/13/2019   Procedure: COLONOSCOPY WITH PROPOFOL;  Surgeon: Jonathon Bellows, MD;  [facility];  Service: Gastroenterology;  Laterality: N/A;   ESOPHAGOGASTRODUODENOSCOPY (EGD) WITH PROPOFOL N/A 10/21/2019   Procedure: ESOPHAGOGASTRODUODENOSCOPY (EGD) WITH PROPOFOL;  Surgeon: Milus Banister, MD;  Location: [facility];  Service: Endoscopy;  Laterality: N/A;   EUS N/A 10/21/2019   Procedure: UPPER ENDOSCOPIC ULTRASOUND (EUS) RADIAL;  Surgeon: Milus Banister, MD;  Location: [facility];  Service: Endoscopy;  Laterality: N/A;   FINE NEEDLE ASPIRATION N/A 10/21/2019   Procedure: FINE NEEDLE ASPIRATION (FNA) LINEAR;  Surgeon: Milus Banister, MD;  Location: [facility];  Service: Endoscopy;  Laterality: N/A;   HEMORRHOID SURGERY N/A 06/26/2020   Procedure: HEMORRHOIDECTOMY;  Surgeon: Fredirick Maudlin, MD;  Location: [facility];  Service: General;  Laterality: N/A;    Social History   Tobacco Use   Smoking status: Former    Packs/day: 0.50    Years: 10.00    Total pack years: 5.00    Types: Cigarettes    Quit date: 2002    Years since quitting: 22.2    Passive exposure: Past  Smokeless tobacco: Never  Vaping Use   Vaping Use: Never used  Substance Use Topics   Alcohol use: Yes    Comment: socially   Drug use: Never     Medication list has been reviewed and updated.  No outpatient medications have been marked as taking for the 06/10/22 encounter (Appointment) with Glean Hess, MD.       04/22/2022    3:00 PM 01/14/2022   11:32 AM 10/11/2021   10:00 AM 04/25/2021    9:19 AM  GAD 7 : Generalized Anxiety Score  Nervous, Anxious, on Edge 0 1 1 0  Control/stop worrying 0 0 1 0  Worry too much - different things 0 1 1 0  Trouble relaxing 0 1 1 0   Restless 0 1 1 0  Easily annoyed or irritable 0 1 1 0  Afraid - awful might happen 0 0 0 0  Total GAD 7 Score 0 5 6 0  Anxiety Difficulty Not difficult at all Somewhat difficult Somewhat difficult Not difficult at all       04/22/2022    3:00 PM 01/14/2022   11:32 AM 10/11/2021   10:00 AM  Depression screen PHQ 2/9  Decreased Interest 0 2 1  Down, Depressed, Hopeless 0 2 1  PHQ - 2 Score 0 4 2  Altered sleeping 0 2 1  Tired, decreased energy 0 2 1  Change in appetite 0 2 1  Feeling bad or failure about yourself  0 0 1  Trouble concentrating 0 0 1  Moving slowly or fidgety/restless 0 1 1  Suicidal thoughts 0 0 0  PHQ-9 Score 0 11 8  Difficult doing work/chores Not difficult at all Not difficult at all Somewhat difficult    BP Readings from Last 3 Encounters:  04/22/22 110/76  01/14/22 128/74  10/29/21 (!) 145/72    Physical Exam Vitals and nursing note reviewed.  Constitutional:      General: She is not in acute distress.    Appearance: She is well-developed.  HENT:     Head: Normocephalic and atraumatic.     Right Ear: Tympanic membrane and ear canal normal.     Left Ear: Tympanic membrane and ear canal normal.     Nose:     Right Sinus: No maxillary sinus tenderness.     Left Sinus: No maxillary sinus tenderness.  Eyes:     General: No scleral icterus.       Right eye: No discharge.        Left eye: No discharge.     Conjunctiva/sclera: Conjunctivae normal.  Neck:     Thyroid: No thyromegaly.     Vascular: No carotid bruit.  Cardiovascular:     Rate and Rhythm: Normal rate and regular rhythm.     Pulses: Normal pulses.     Heart sounds: Normal heart sounds.  Pulmonary:     Effort: Pulmonary effort is normal. No respiratory distress.     Breath sounds: No wheezing.  Chest:  Breasts:    Right: No mass, nipple discharge, skin change or tenderness.     Left: No mass, nipple discharge, skin change or tenderness.  Abdominal:     General: Bowel sounds are  normal.     Palpations: Abdomen is soft.     Tenderness: There is no abdominal tenderness.  Musculoskeletal:     Cervical back: Normal range of motion. No erythema.     Right lower leg: No edema.  Left lower leg: No edema.  Lymphadenopathy:     Cervical: No cervical adenopathy.  Skin:    General: Skin is warm and dry.     Findings: No rash.  Neurological:     Mental Status: She is alert and oriented to person, place, and time.     Cranial Nerves: No cranial nerve deficit.     Sensory: No sensory deficit.     Deep Tendon Reflexes: Reflexes are normal and symmetric.  Psychiatric:        Attention and Perception: Attention normal.        Mood and Affect: Mood normal.     Wt Readings from Last 3 Encounters:  04/22/22 161 lb (73 kg)  01/14/22 161 lb 3.2 oz (73.1 kg)  10/29/21 161 lb (73 kg)    There were no vitals taken for this visit.  Assessment and Plan:

## 2022-06-10 NOTE — Assessment & Plan Note (Deleted)
Seen by Pulmonary last year Referred to Cardiology and for home sleep test but neither one done

## 2022-06-27 ENCOUNTER — Other Ambulatory Visit: Payer: Self-pay | Admitting: Internal Medicine

## 2022-06-27 DIAGNOSIS — I700 Atherosclerosis of aorta: Secondary | ICD-10-CM

## 2022-06-27 NOTE — Telephone Encounter (Signed)
Requested medications are due for refill today.  unsure  Requested medications are on the active medications list.  yes  Last refill. 05/29/2022 #90 0 rf  Future visit scheduled.   no  Notes to clinic.  Labs are expired    Requested Prescriptions  Pending Prescriptions Disp Refills   atorvastatin (LIPITOR) 10 MG tablet 90 tablet 0    Sig: Take 1 tablet (10 mg total) by mouth daily.     Cardiovascular:  Antilipid - Statins Failed - 06/27/2022 11:14 AM      Failed - Lipid Panel in normal range within the last 12 months    Cholesterol, Total  Date Value Ref Range Status  04/25/2021 209 (H) 100 - 199 mg/dL Final   LDL Chol Calc (NI[facility])  Date Value Ref Range Status  04/25/2021 63 0 - 99 mg/dL Final   HDL  Date Value Ref Range Status  04/25/2021 131 >39 mg/dL Final   Triglycerides  Date Value Ref Range Status  04/25/2021 91 0 - 149 mg/dL Final         Passed - Patient is not pregnant      Passed - Valid encounter within last 12 months    Recent Outpatient Visits           2 months ago Candidiasis of breast   [place] [facility] [place], [place], MD   5 months ago Sacroiliac joint pain   [facility] [place] [facility], Jesse Sans, MD   8 months ago [place] [facility] [place], Earley Abide, MD   1 year ago Annual physical exam   [place] [facility], Jesse Sans, MD   1 year ago Subacute sinusitis, unspecified location   [place] at [facility], Jesse Sans, MD

## 2022-06-27 NOTE — Telephone Encounter (Signed)
Medication Refill - Medication: atorvastatin (LIPITOR) 10 MG tablet [id number]   Has the patient contacted their pharmacy? Yes.    (Agent: If yes, when and what did the pharmacy advise?) Contact PCP   Preferred Pharmacy (with phone number or street name): [place], [place]   Has the patient been seen for an appointment in the last year OR does the patient have an upcoming appointment? Yes.    Agent: Please be advised that RX refills may take up to 3 business days. We ask that you follow-up with your pharmacy.   Pt has 3 pills remaining.

## 2022-07-03 ENCOUNTER — Other Ambulatory Visit: Payer: Self-pay

## 2022-07-03 ENCOUNTER — Ambulatory Visit: Payer: Self-pay

## 2022-07-03 ENCOUNTER — Telehealth: Payer: Self-pay | Admitting: Internal Medicine

## 2022-07-03 DIAGNOSIS — I700 Atherosclerosis of aorta: Secondary | ICD-10-CM

## 2022-07-03 MED ORDER — ATORVASTATIN CALCIUM 10 MG PO TABS: 10.0000 mg | ORAL_TABLET | Freq: Every day | ORAL | 0 refills | Status: DC
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
## 2022-07-03 NOTE — Telephone Encounter (Signed)
Pt called reporting that she is completely out of her current supply

## 2022-07-03 NOTE — Telephone Encounter (Signed)
Medication refilled.  Claudia Gutierrez

## 2022-07-03 NOTE — Telephone Encounter (Signed)
Medication Refill - Medication: atorvastatin (LIPITOR) 10 MG tablet  Has the patient contacted their pharmacy? No  Preferred Pharmacy (with phone number or street name):  [place], [place] [facility] [place] [address] [place] [address] Phone: [phone]  Fax: [phone]   Has the patient been seen for an appointment in the last year OR does the patient have an upcoming appointment? Yes.    This the patients 3rd time calling about her medication and stated she took her last pill yesterday and feels like its something she need to take everyday. Please reference 3/28 encounter.

## 2022-07-03 NOTE — Telephone Encounter (Signed)
3rd attempt, Patient called, left VM to return the call to the office to discuss symptoms with a nurse. Unable to reach patient after 3 attempts by PEC NT, routing to the provider for resolution per protocol.   

## 2022-07-03 NOTE — Telephone Encounter (Signed)
2nd attempt, Patient called, left VM to return the call to the office to discuss symptoms with a nurse.  

## 2022-07-08 ENCOUNTER — Ambulatory Visit (INDEPENDENT_AMBULATORY_CARE_PROVIDER_SITE_OTHER): Payer: Medicare HMO

## 2022-07-08 ENCOUNTER — Ambulatory Visit (INDEPENDENT_AMBULATORY_CARE_PROVIDER_SITE_OTHER): Payer: Medicare HMO | Admitting: Internal Medicine

## 2022-07-08 ENCOUNTER — Encounter: Payer: Self-pay | Admitting: Internal Medicine

## 2022-07-08 VITALS — Ht 66.0 in | Wt 154.8 lb

## 2022-07-08 VITALS — BP 110/78 | HR 64 | Ht 66.0 in | Wt 154.8 lb

## 2022-07-08 DIAGNOSIS — M545 Low back pain, unspecified: Secondary | ICD-10-CM

## 2022-07-08 DIAGNOSIS — Z0000 Encounter for general adult medical examination without abnormal findings: Secondary | ICD-10-CM | POA: Diagnosis not present

## 2022-07-08 MED ORDER — BACLOFEN 10 MG PO TABS: 10.0000 mg | ORAL_TABLET | Freq: Three times a day (TID) | ORAL | 0 refills | Status: DC

## 2022-07-08 NOTE — Patient Instructions (Signed)
Take 3 ibuprofen three times a day.  Take baclofen three times a day  Use heat
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
## 2022-07-08 NOTE — Addendum Note (Signed)
Addended by: Reubin Milan on: 07/08/2022 08:57 AM   Modules accepted: Level of Service

## 2022-07-08 NOTE — Progress Notes (Signed)
Subjective:   Claudia Gutierrez is a 72 y.o. female who presents for an Initial Medicare Annual Wellness Visit.  I connected with  Claudia Gutierrez on 07/08/22 by an in person visit and verified that I am speaking with the correct person using two identifiers.  Patient Location: Other:  Office/ Clinic  Provider Location: Office/Clinic  I discussed the limitations of evaluation and management by telemedicine. The patient expressed understanding and agreed to proceed.   Review of Systems    Defer to PCP  Cardiac Risk Factors include: advanced age (>64men, >63 women)     Objective:    Today's Vitals   07/08/22 0844 07/08/22 0845  Weight: 154 lb 12.8 oz (70.2 kg)   Height: 5\' 6"  (1.676 m)   PainSc: 0-No pain 0-No pain   Body mass index is 24.99 kg/m.     07/08/2022    8:47 AM 11/11/2020    4:14 PM 10/18/2020   12:33 PM 06/26/2020   10:00 AM 06/22/2020    4:36 PM 01/11/2020   11:01 AM 12/21/2019    8:35 AM  Advanced Directives  Does Patient Have a Medical Advance Directive? No No No No No No No  Would patient like information on creating a medical advance directive? No - Patient declined  No - Patient declined No - Patient declined  No - Patient declined Yes (MAU/Ambulatory/Procedural Areas - Information given)    Current Medications (verified) Outpatient Encounter Medications as of 07/08/2022  Medication Sig   albuterol (VENTOLIN HFA) 108 (90 Base) MCG/ACT inhaler Inhale 1-2 puffs into the lungs every 6 (six) hours as needed.   atorvastatin (LIPITOR) 10 MG tablet Take 1 tablet (10 mg total) by mouth daily.   BIOTIN PO Take 1,000 mg by mouth daily.   Cholecalciferol (VITAMIN D-3) 125 MCG (5000 UT) TABS Take 125 mcg by mouth daily.   citalopram (CELEXA) 20 MG tablet Take 1 tablet by mouth once daily   fluticasone furoate-vilanterol (BREO ELLIPTA) 100-25 MCG/ACT AEPB Inhale 1 puff into the lungs daily.   folic acid (FOLVITE) 800 MCG tablet Take 800 mcg by mouth daily.   mupirocin  ointment (BACTROBAN) 2 % Apply 1 Application topically 2 (two) times daily.   nystatin cream (MYCOSTATIN) Apply 1 Application topically 2 (two) times daily.   Omega-3 Fatty Acids (FISH OIL PO) Take 1 capsule by mouth daily.   omeprazole (PRILOSEC) 20 MG capsule TAKE 1 CAPSULE BY MOUTH TWICE DAILY BEFORE A MEAL   traZODone (DESYREL) 100 MG tablet Take 1 tablet (100 mg total) by mouth at bedtime as needed. for sleep   No facility-administered encounter medications on file as of 07/08/2022.    Allergies (verified) Azithromycin, Paroxetine hcl, Ciprofloxacin, Sodium clavulanate, and Codeine   History: Past Medical History:  Diagnosis Date   Acid reflux    Anxiety    Complication of anesthesia    ponv   History of kidney stones    PONV (postoperative nausea and vomiting)    2002 colonoscopy   Wears dentures    Full upper   Past Surgical History:  Procedure Laterality Date   ACHILLES TENDON REPAIR  2008   ARTHRODESIS METATARSALPHALANGEAL JOINT (MTPJ) Left 10/18/2020   Procedure: ARTHRODESIS METATARSALPHALANGEAL JOINT (MTPJ);  Surgeon: Gwyneth Revels, DPM;  Location: [facility];  Service: Podiatry;  Laterality: Left;  Anesthesia- General with local   BIOPSY  10/21/2019   Procedure: BIOPSY;  Surgeon: Rachael Fee, MD;  Location: [facility];  Service: Endoscopy;;  BOTOX INJECTION N/A 06/26/2020   Procedure: BOTOX INJECTION into internal anal sphincter muscles;  Surgeon: Duanne Guess, MD;  Location: [facility];  Service: General;  Laterality: N/A;  Provider requesting 30 minutes for procedure   BREAST CYST ASPIRATION Right 2010   CATARACT EXTRACTION W/PHACO Right 12/21/2019   Procedure: CATARACT EXTRACTION PHACO AND INTRAOCULAR LENS PLACEMENT (IOC) RIGHT 4.15 00:31.2;  Surgeon: Galen Manila, MD;  Location: [facility];  Service: Ophthalmology;  Laterality: Right;   CATARACT EXTRACTION W/PHACO Left 01/11/2020   Procedure: CATARACT EXTRACTION PHACO AND INTRAOCULAR  LENS PLACEMENT (IOC) LEFT 4.78 00:26.5;  Surgeon: Galen Manila, MD;  Location: [facility];  Service: Ophthalmology;  Laterality: Left;   COLONOSCOPY WITH PROPOFOL N/A 09/13/2019   Procedure: COLONOSCOPY WITH PROPOFOL;  Surgeon: Wyline Mood, MD;  Location: [facility];  Service: Gastroenterology;  Laterality: N/A;   ESOPHAGOGASTRODUODENOSCOPY (EGD) WITH PROPOFOL N/A 10/21/2019   Procedure: ESOPHAGOGASTRODUODENOSCOPY (EGD) WITH PROPOFOL;  Surgeon: Rachael Fee, MD;  Location: [facility];  Service: Endoscopy;  Laterality: N/A;   EUS N/A 10/21/2019   Procedure: UPPER ENDOSCOPIC ULTRASOUND (EUS) RADIAL;  Surgeon: Rachael Fee, MD;  Location: [facility];  Service: Endoscopy;  Laterality: N/A;   FINE NEEDLE ASPIRATION N/A 10/21/2019   Procedure: FINE NEEDLE ASPIRATION (FNA) LINEAR;  Surgeon: Rachael Fee, MD;  Location: [facility];  Service: Endoscopy;  Laterality: N/A;   HEMORRHOID SURGERY N/A 06/26/2020   Procedure: HEMORRHOIDECTOMY;  Surgeon: Duanne Guess, MD;  Location: [facility];  Service: General;  Laterality: N/A;   Family History  Problem Relation Age of Onset   Breast cancer Sister 60       and stomach ca   Diabetes Sister    Colon cancer Sister 78   Lung cancer Father    Diabetes Father    Heart disease Father    Hypertension Father    Stroke Paternal Grandmother    Social History   Socioeconomic History   Marital status: Significant Other    Spouse name: Not on file   Number of children: Not on file   Years of education: Not on file   Highest education level: Associate degree: occupational, Scientist, product/process development, or vocational program  Occupational History   Not on file  Tobacco Use   Smoking status: Former    Packs/day: 0.50    Years: 10.00    Additional pack years: 0.00    Total pack years: 5.00    Types: Cigarettes    Quit date: 2002    Years since quitting: 22.2    Passive exposure: Past   Smokeless tobacco: Never  Vaping Use   Vaping Use:  Never used  Substance and Sexual Activity   Alcohol use: Yes    Comment: socially   Drug use: Never   Sexual activity: Not Currently  Other Topics Concern   Not on file  Social History Narrative   Not on file   Social Determinants of Health   Financial Resource Strain: Low Risk  (07/05/2022)   Overall Financial Resource Strain (CARDIA)    Difficulty of Paying Living Expenses: Not hard at all  Food Insecurity: No Food Insecurity (07/05/2022)   Hunger Vital Sign    Worried About Running Out of Food in the Last Year: Never true    Ran Out of Food in the Last Year: Never true  Transportation Needs: No Transportation Needs (07/08/2022)   PRAPARE - Administrator, Civil Service (Medical): No    Lack of Transportation (Non-Medical):  No  Physical Activity: Inactive (07/08/2022)   Exercise Vital Sign    Days of Exercise per Week: 0 days    Minutes of Exercise per Session: 0 min  Stress: Stress Concern Present (07/05/2022)   [employer] - Occupational Stress Questionnaire    Feeling of Stress : To some extent  Social Connections: Moderately Integrated (07/05/2022)   Social Connection and Isolation Panel [NHANES]    Frequency of Communication with Friends and Family: Three times a week    Frequency of Social Gatherings with Friends and Family: Once a week    Attends Religious Services: More than 4 times per year    Active Member of [employer]ubs or Organizations: No    Attends Engineer, structuralClub or Organization Meetings: Not on file    Marital Status: Living with partner    Tobacco Counseling Counseling given: Not Answered   Clinical Intake:  Pre-visit preparation completed: Yes  Pain : No/denies pain Pain Score: 0-No pain     BMI - recorded: 24.99 Nutritional Status: BMI of 19-24  Normal Nutritional Risks: None Diabetes: No  How often do you need to have someone help you when you read instructions, pamphlets, or other written materials from your doctor or  pharmacy?: 1 - Never  Diabetic? No.  Interpreter Needed?: No  Information entered by :: Margaretha SheffieldChassidy Wanita Derenzo, CMA   Activities of Daily Living    07/08/2022    8:47 AM  In your present state of health, do you have any difficulty performing the following activities:  Hearing? 0  Vision? 0  Difficulty concentrating or making decisions? 0  Walking or climbing stairs? 0  Dressing or bathing? 0  Doing errands, shopping? 0  Preparing Food and eating ? N  Using the Toilet? N  In the past six months, have you accidently leaked urine? N  Do you have problems with loss of bowel control? N  Managing your Medications? N  Managing your Finances? N  Housekeeping or managing your Housekeeping? N    Patient Care Team: Reubin MilanBerglund, Laura H, MD as PCP - General (Internal Medicine) Wyline MoodAnna, Kiran, MD as Consulting Physician (Gastroenterology) Rachael FeeJacobs, Daniel P, MD as Attending Physician (Gastroenterology)  Indicate any recent Medical Services you may have received from other than [facility] providers in the past year (date may be approximate).     Assessment:   This is a routine wellness examination for GrandinDenise.  Hearing/Vision screen Hearing Screening - Comments:: No concerns. Vision Screening - Comments:: No concerns.  Dietary issues and exercise activities discussed: Current Exercise Habits: The patient does not participate in regular exercise at present   Goals Addressed   None   Depression Screen    07/08/2022    8:46 AM 04/22/2022    3:00 PM 01/14/2022   11:32 AM 10/11/2021   10:00 AM 04/25/2021    9:19 AM 10/11/2020   11:16 AM 10/11/2020   11:15 AM  PHQ 2/9 Scores  PHQ - 2 Score 2 0 4 2 0 0 0  PHQ- 9 Score 3 0 11 8 2 5  0    Fall Risk    07/08/2022    8:47 AM 04/22/2022    3:00 PM 01/14/2022   11:32 AM 10/11/2021   10:00 AM 04/25/2021    9:19 AM  Fall Risk   Falls in the past year? 0 0 0 0 0  Number falls in past yr: 0 0 0  0  Injury with Fall? 0 0 0 0 0  Risk  for fall due to : No Fall  Risks No Fall Risks No Fall Risks  No Fall Risks  Follow up Falls evaluation completed Falls evaluation completed Falls evaluation completed  Falls evaluation completed    FALL RISK PREVENTION PERTAINING TO THE HOME:  Any stairs in or around the home? No  If so, are there any without handrails?  N/A Home free of loose throw rugs in walkways, pet beds, electrical cords, etc? Yes  Adequate lighting in your home to reduce risk of falls? Yes   ASSISTIVE DEVICES UTILIZED TO PREVENT FALLS:  Life alert? No  Use of a cane, walker or w/c? No   TIMED UP AND GO:  Was the test performed? Yes .  Gait slow and steady without use of assistive device  Immunizations Immunization History  Administered Date(s) Administered   Fluad Quad(high Dose 65+) 02/07/2020, 01/14/2022   Influenza, Seasonal, Injecte, Preservative Fre 01/13/2007, 01/08/2010, 01/13/2013   Influenza,inj,Quad PF,6+ Mos 02/28/2017, 04/25/2021   Influenza-Unspecified 12/30/2013   Moderna Sars-Covid-2 Vaccination 05/15/2019, 06/11/2019   PNEUMOCOCCAL CONJUGATE-20 04/25/2021   Pneumococcal Conjugate-13 10/05/2019    TDAP status: Due, Education has been provided regarding the importance of this vaccine. Advised may receive this vaccine at local pharmacy or Health Dept. Aware to provide a copy of the vaccination record if obtained from local pharmacy or Health Dept. Verbalized acceptance and understanding.  Flu Vaccine status: Up to date  Pneumococcal vaccine status: Up to date  Covid-19 vaccine status: Completed vaccines  Qualifies for Shingles Vaccine? Yes   Zostavax completed No   Shingrix Completed?: No.    Education has been provided regarding the importance of this vaccine. Patient has been advised to call insurance company to determine out of pocket expense if they have not yet received this vaccine. Advised may also receive vaccine at local pharmacy or Health Dept. Verbalized acceptance and understanding.  Screening  Tests Health Maintenance  Topic Date Due   DTaP/Tdap/Td (1 - Tdap) Never done   Zoster Vaccines- Shingrix (1 of 2) Never done   DEXA SCAN  Never done   COVID-19 Vaccine (3 - Moderna risk series) 07/09/2019   MAMMOGRAM  07/03/2022   INFLUENZA VACCINE  10/31/2022   Medicare Annual Wellness (AWV)  07/08/2023   COLONOSCOPY (Pts 45-73yrs Insurance coverage will need to be confirmed)  09/12/2029   Pneumonia Vaccine 42+ Years old  Completed   Hepatitis C Screening  Completed   HPV VACCINES  Aged Out    Health Maintenance  Health Maintenance Due  Topic Date Due   DTaP/Tdap/Td (1 - Tdap) Never done   Zoster Vaccines- Shingrix (1 of 2) Never done   DEXA SCAN  Never done   COVID-19 Vaccine (3 - Moderna risk series) 07/09/2019   MAMMOGRAM  07/03/2022    Colorectal cancer screening: Type of screening: Colonoscopy. Completed 09/13/2019. Repeat every 10 years  Mammogram status: Completed 07/02/2021. Repeat every year  Lung Cancer Screening: (Low Dose CT Chest recommended if Age 9-80 years, 30 pack-year currently smoking OR have quit w/in 15years.) does not qualify.   Additional Screening:  Hepatitis C Screening: does qualify; Completed 04/25/2021  Vision Screening: Recommended annual ophthalmology exams for early detection of glaucoma and other disorders of the eye. Is the patient up to date with their annual eye exam?  Yes  Who is the provider or what is the name of the office in which the patient attends annual eye exams? [facility]  Dental Screening: Recommended annual dental exams for  proper oral hygiene  Community Resource Referral / Chronic Care Management: CRR required this visit?  No   CCM required this visit?  No      Plan:     I have personally reviewed and noted the following in the patient's chart:   Medical and social history Use of alcohol, tobacco or illicit drugs  Current medications and supplements including opioid prescriptions. Patient is not  currently taking opioid prescriptions. Functional ability and status Nutritional status Physical activity Advanced directives List of other physicians Hospitalizations, surgeries, and ER visits in previous 12 months Vitals Screenings to include cognitive, depression, and falls Referrals and appointments  In addition, I have reviewed and discussed with patient certain preventive protocols, quality metrics, and best practice recommendations. A written personalized care plan for preventive services as well as general preventive health recommendations were provided to patient.     Mariel Sleet, CMA   07/08/2022   Nurse Notes: None.

## 2022-07-12 DIAGNOSIS — H40003 Preglaucoma, unspecified, bilateral: Secondary | ICD-10-CM | POA: Diagnosis not present

## 2022-07-16 DIAGNOSIS — H02831 Dermatochalasis of right upper eyelid: Secondary | ICD-10-CM | POA: Diagnosis not present

## 2022-07-16 DIAGNOSIS — H57813 Brow ptosis, bilateral: Secondary | ICD-10-CM | POA: Diagnosis not present

## 2022-07-16 DIAGNOSIS — H02834 Dermatochalasis of left upper eyelid: Secondary | ICD-10-CM | POA: Diagnosis not present

## 2022-08-07 DIAGNOSIS — Z1231 Encounter for screening mammogram for malignant neoplasm of breast: Secondary | ICD-10-CM | POA: Diagnosis not present

## 2022-08-12 ENCOUNTER — Encounter: Payer: Self-pay | Admitting: Internal Medicine

## 2022-08-14 ENCOUNTER — Other Ambulatory Visit: Payer: Self-pay | Admitting: Internal Medicine

## 2022-08-14 NOTE — Telephone Encounter (Signed)
Requested Prescriptions  Pending Prescriptions Disp Refills   citalopram (CELEXA) 20 MG tablet [Pharmacy Med Name: Citalopram Hydrobromide 20 MG Oral Tablet] 90 tablet 0    Sig: Take 1 tablet by mouth once daily     Psychiatry:  Antidepressants - SSRI Passed - 08/14/2022  9:07 AM      Passed - Completed PHQ-2 or PHQ-9 in the last 360 days      Passed - Valid encounter within last 6 months    Recent Outpatient Visits           1 month ago Acute right-sided low back pain without sciatica   [place] [facility] at [facility], Nyoka Cowden, MD   3 months ago Candidiasis of breast   [place] [facility] In[facility], MD   7 months ago Sacroiliac joint pain   [place] [facility] at [facility], Nyoka Cowden, MD   10 months ago Dysuria   [facility] at [facility], Ocie Bob, MD   1 year ago Annual physical exam   [facility] at [facility], Nyoka Cowden, MD       Future Appointments             In 2 months Judithann Graves, Nyoka Cowden, MD [facility] at [facility], [facility]

## 2022-08-16 ENCOUNTER — Other Ambulatory Visit: Payer: Self-pay

## 2022-08-16 ENCOUNTER — Telehealth: Payer: Self-pay

## 2022-08-16 NOTE — Telephone Encounter (Signed)
Called patient, unable to leave message due to VM being full.

## 2022-08-16 NOTE — Telephone Encounter (Signed)
-----   Message from Loretha Stapler, RN sent at 08/15/2020 11:00 AM EDT ----- repeat MRI of the pancreas in 2 years.  This recommendation follows AGA consensus guidelines 2015.

## 2022-08-19 NOTE — Telephone Encounter (Signed)
Left VM message for patient to call back.

## 2022-08-20 NOTE — Telephone Encounter (Signed)
Left VM message for patient to call back. Third call attempt. Letter mailed

## 2022-09-09 ENCOUNTER — Other Ambulatory Visit: Payer: Self-pay

## 2022-09-09 DIAGNOSIS — K862 Cyst of pancreas: Secondary | ICD-10-CM

## 2022-09-17 ENCOUNTER — Other Ambulatory Visit: Payer: Self-pay | Admitting: Internal Medicine

## 2022-09-17 DIAGNOSIS — I700 Atherosclerosis of aorta: Secondary | ICD-10-CM

## 2022-09-17 MED ORDER — ATORVASTATIN CALCIUM 10 MG PO TABS: 10.0000 mg | ORAL_TABLET | Freq: Every day | ORAL | 0 refills | Status: DC

## 2022-10-04 ENCOUNTER — Encounter: Payer: Self-pay | Admitting: Internal Medicine

## 2022-10-08 ENCOUNTER — Other Ambulatory Visit: Payer: Self-pay | Admitting: Internal Medicine

## 2022-10-08 DIAGNOSIS — F325 Major depressive disorder, single episode, in full remission: Secondary | ICD-10-CM

## 2022-10-08 MED ORDER — CITALOPRAM HYDROBROMIDE 20 MG PO TABS: 20.0000 mg | ORAL_TABLET | Freq: Every day | ORAL | 0 refills | Status: DC

## 2022-10-08 MED ORDER — TRAZODONE HCL 100 MG PO TABS
100.0000 mg | ORAL_TABLET | Freq: Every evening | ORAL | 2 refills | Status: DC | PRN
Start: 2022-10-08 — End: 2024-01-08

## 2022-10-08 NOTE — Telephone Encounter (Signed)
Change of pharmacy   Requested Prescriptions  Pending Prescriptions Disp Refills   citalopram (CELEXA) 20 MG tablet 90 tablet 0    Sig: Take 1 tablet (20 mg total) by mouth daily.     Psychiatry:  Antidepressants - SSRI Passed - 10/08/2022  9:11 AM      Passed - Completed PHQ-2 or PHQ-9 in the last 360 days      Passed - Valid encounter within last 6 months    Recent Outpatient Visits           3 months ago Acute right-sided low back pain without sciatica   [place] [facility] at [facility], Nyoka Cowden, MD   5 months ago Candidiasis of breast   [place] [facility], MD   8 months ago Sacroiliac joint pain   [place] [facility] at [facility], Nyoka Cowden, MD   12 months ago Dysuria   [facility] at [facility], Ocie Bob, MD   1 year ago Annual physical exam   [facility] at [facility], Nyoka Cowden, MD       Future Appointments             In 2 weeks Reubin Milan, MD [place] [facility] at [facility], [facility]             traZODone (DESYREL) 100 MG tablet 90 tablet 2    Sig: Take 1 tablet (100 mg total) by mouth at bedtime as needed. for sleep     Psychiatry: Antidepressants - Serotonin Modulator Passed - 10/08/2022  9:11 AM      Passed - Completed PHQ-2 or PHQ-9 in the last 360 days      Passed - Valid encounter within last 6 months    Recent Outpatient Visits           3 months ago Acute right-sided low back pain without sciatica   [place] [facility] at [facility], Nyoka Cowden, MD   5 months ago Candidiasis of breast   [place] [facility], MD   8 months ago Sacroiliac joint pain   [place] [facility] at [facility], Nyoka Cowden, MD   12 months ago Dysuria   [facility] at [facility], Ocie Bob, MD   1 year ago Annual physical exam   [facility] at [facility], Nyoka Cowden, MD       Future Appointments             In 2 weeks Judithann Graves, Nyoka Cowden, MD [facility] & [facility] Medicine at [facility], [facility]

## 2022-10-08 NOTE — Telephone Encounter (Signed)
Medication Refill - Medication: citalopram (CELEXA) 20 MG tablet - Stated has one tablet left.   traZODone (DESYREL) 100 MG tablet - Please transfer the remaining refills to the pharmacy below.   Has the patient contacted their pharmacy? Yes.   No, more refills.   (Agent: If yes, when and what did the pharmacy advise?)  Preferred Pharmacy (with phone number or street name):  [facility] [place], [place] - [address]  [address] [place] [address]  Phone: [phone] Fax: [phone]  Hours: Not open 24 hours   Has the patient been seen for an appointment in the last year OR does the patient have an upcoming appointment? Yes.    Agent: Please be advised that RX refills may take up to 3 business days. We ask that you follow-up with your pharmacy.

## 2022-10-28 ENCOUNTER — Encounter: Payer: Self-pay | Admitting: Internal Medicine

## 2022-10-28 ENCOUNTER — Ambulatory Visit (INDEPENDENT_AMBULATORY_CARE_PROVIDER_SITE_OTHER): Payer: Medicare HMO | Admitting: Internal Medicine

## 2022-10-28 VITALS — BP 112/64 | HR 70 | Temp 97.8°F | Ht 66.0 in | Wt 154.0 lb

## 2022-10-28 DIAGNOSIS — F325 Major depressive disorder, single episode, in full remission: Secondary | ICD-10-CM

## 2022-10-28 DIAGNOSIS — I272 Pulmonary hypertension, unspecified: Secondary | ICD-10-CM

## 2022-10-28 DIAGNOSIS — K219 Gastro-esophageal reflux disease without esophagitis: Secondary | ICD-10-CM | POA: Diagnosis not present

## 2022-10-28 DIAGNOSIS — I700 Atherosclerosis of aorta: Secondary | ICD-10-CM | POA: Diagnosis not present

## 2022-10-28 DIAGNOSIS — R7989 Other specified abnormal findings of blood chemistry: Secondary | ICD-10-CM

## 2022-10-28 DIAGNOSIS — B349 Viral infection, unspecified: Secondary | ICD-10-CM | POA: Diagnosis not present

## 2022-10-28 DIAGNOSIS — Z13820 Encounter for screening for osteoporosis: Secondary | ICD-10-CM

## 2022-10-28 DIAGNOSIS — Z0000 Encounter for general adult medical examination without abnormal findings: Secondary | ICD-10-CM | POA: Diagnosis not present

## 2022-10-28 LAB — POC COVID19 BINAXNOW: SARS Coronavirus 2 Ag: NEGATIVE

## 2022-10-28 NOTE — Assessment & Plan Note (Signed)
LDL is  Lab Results  Component Value Date   LDLCALC 63 04/25/2021   Currently being treated with atorvastatin with good compliance and no concerns.

## 2022-10-28 NOTE — Assessment & Plan Note (Signed)
Chronic mild shortness of breath unchanged Continues to use albuterol MDI prn

## 2022-10-28 NOTE — Assessment & Plan Note (Signed)
Reflux symptoms are minimal on current therapy - omeprazole. No red flag signs such as weight loss, n/v, melena  

## 2022-10-28 NOTE — Progress Notes (Signed)
Date:  10/28/2022   Name:  Claudia Gutierrez   DOB:  1950-12-18   MRN:  [id number]   Chief Complaint: Annual Exam Claudia Gutierrez is a 72 y.o. female who presents today for her Complete Annual Exam.  She reports exercising - none. She reports she is sleeping well. Breast complaints - none. She has a rhinorrhea, sore glands and body aches - started three days ago.  Mammogram: 07/2022  [facility] DEXA: none Colonoscopy: 08/2019 repeat 10 yrs  Health Maintenance Due  Topic Date Due   DTaP/Tdap/Td (1 - Tdap) Never done   Zoster Vaccines- Shingrix (1 of 2) Never done   DEXA SCAN  Never done   COVID-19 Vaccine (3 - Moderna risk series) 07/09/2019    Immunization History  Administered Date(s) Administered   Fluad Quad(high Dose 65+) 02/07/2020, 01/14/2022   Influenza, Seasonal, Injecte, Preservative Fre 01/13/2007, 01/08/2010, 01/13/2013   Influenza,inj,Quad PF,6+ Mos 02/28/2017, 04/25/2021   Influenza-Unspecified 12/30/2013   Moderna Sars-Covid-2 Vaccination 05/15/2019, 06/11/2019   PNEUMOCOCCAL CONJUGATE-20 04/25/2021   Pneumococcal Conjugate-13 10/05/2019    Hyperlipidemia This is a chronic problem. The problem is controlled. Associated symptoms include myalgias. Pertinent negatives include no chest pain or shortness of breath. Current antihyperlipidemic treatment includes statins.  Depression        This is a chronic problem.  The problem has been resolved since onset.  Associated symptoms include myalgias.  Associated symptoms include no fatigue and no headaches.  Past treatments include SSRIs - Selective serotonin reuptake inhibitors. Gastroesophageal Reflux She reports no abdominal pain, no chest pain, no coughing or no wheezing. This is a recurrent problem. The problem occurs rarely. Pertinent negatives include no fatigue. She has tried a PPI for the symptoms.    Lab Results  Component Value Date   NA 140 04/25/2021   K 4.6 04/25/2021   CO2 24 04/25/2021   GLUCOSE 95 04/25/2021    BUN 14 04/25/2021   CREATININE 0.94 04/25/2021   CALCIUM 9.7 04/25/2021   EGFR 65 04/25/2021   GFRNONAA >60 11/11/2020   Lab Results  Component Value Date   CHOL 209 (H) 04/25/2021   HDL 131 04/25/2021   LDLCALC 63 04/25/2021   TRIG 91 04/25/2021   CHOLHDL 1.6 04/25/2021   Lab Results  Component Value Date   TSH 4.900 (H) 04/25/2021   No results found for: "HGBA1C" Lab Results  Component Value Date   WBC 5.3 04/25/2021   HGB 13.0 04/25/2021   HCT 40.4 04/25/2021   MCV 91 04/25/2021   PLT 184 04/25/2021   Lab Results  Component Value Date   ALT 14 04/25/2021   AST 25 04/25/2021   ALKPHOS 83 04/25/2021   BILITOT 0.5 04/25/2021   No results found for: "25OHVITD2", "25OHVITD3", "VD25OH"   Review of Systems  Constitutional:  Negative for chills, fatigue and fever.  HENT:  Positive for rhinorrhea. Negative for congestion, hearing loss, tinnitus, trouble swallowing and voice change.   Eyes:  Negative for visual disturbance.  Respiratory:  Negative for cough, chest tightness, shortness of breath and wheezing.   Cardiovascular:  Negative for chest pain, palpitations and leg swelling.  Gastrointestinal:  Negative for abdominal pain, constipation, diarrhea and vomiting.  Endocrine: Negative for polydipsia and polyuria.  Genitourinary:  Negative for dysuria, frequency, genital sores, vaginal bleeding and vaginal discharge.  Musculoskeletal:  Positive for myalgias. Negative for arthralgias, gait problem and joint swelling.  Skin:  Negative for color change and rash.  Neurological:  Negative  for dizziness, tremors, light-headedness and headaches.  Hematological:  Negative for adenopathy. Does not bruise/bleed easily.  Psychiatric/Behavioral:  Positive for depression. Negative for dysphoric mood and sleep disturbance. The patient is not nervous/anxious.     Patient Active Problem List   Diagnosis Date Noted   Pulmonary hypertension (HCC) 06/18/2021   Rectocele 05/22/2021    Dermatochalasis of both upper eyelids 07/06/2020   Ptosis of both eyebrows 07/06/2020   Aortic atherosclerosis (HCC) 04/16/2020   Arthralgia of right side of pelvis 04/14/2020   Palpitation 04/14/2020   History of colon polyps 10/05/2019   Pancreatic cyst 10/05/2019   GERD (gastroesophageal reflux disease) 10/12/2013   Major depressive disorder, single episode, unspecified 10/12/2013    Allergies  Allergen Reactions   Azithromycin Other (See Comments)    SWELLING/EDEMA   Paroxetine Hcl Nausea Only and Other (See Comments)    GI Upset   Ciprofloxacin Itching    Phlebitis    Sodium Clavulanate Diarrhea   Codeine Nausea And Vomiting    Past Surgical History:  Procedure Laterality Date   ACHILLES TENDON REPAIR  2008   ARTHRODESIS METATARSALPHALANGEAL JOINT (MTPJ) Left 10/18/2020   Procedure: ARTHRODESIS METATARSALPHALANGEAL JOINT (MTPJ);  Surgeon: Gwyneth Revels, DPM;  Location: [facility];  Service: Podiatry;  Laterality: Left;  Anesthesia- General with local   BIOPSY  10/21/2019   Procedure: BIOPSY;  Surgeon: Rachael Fee, MD;  Location: [facility];  Service: Endoscopy;;   BOTOX INJECTION N/A 06/26/2020   Procedure: BOTOX INJECTION into internal anal sphincter muscles;  Surgeon: Duanne Guess, MD;  Location: [facility];  Service: General;  Laterality: N/A;  Provider requesting 30 minutes for procedure   BREAST CYST ASPIRATION Right 2010   CATARACT EXTRACTION W/PHACO Right 12/21/2019   Procedure: CATARACT EXTRACTION PHACO AND INTRAOCULAR LENS PLACEMENT (IOC) RIGHT 4.15 00:31.2;  Surgeon: Galen Manila, MD;  Location: [facility];  Service: Ophthalmology;  Laterality: Right;   CATARACT EXTRACTION W/PHACO Left 01/11/2020   Procedure: CATARACT EXTRACTION PHACO AND INTRAOCULAR LENS PLACEMENT (IOC) LEFT 4.78 00:26.5;  Surgeon: Galen Manila, MD;  Location: [facility];  Service: Ophthalmology;  Laterality: Left;   COLONOSCOPY WITH PROPOFOL N/A  09/13/2019   Procedure: COLONOSCOPY WITH PROPOFOL;  Surgeon: Wyline Mood, MD;  Location: [facility];  Service: Gastroenterology;  Laterality: N/A;   ESOPHAGOGASTRODUODENOSCOPY (EGD) WITH PROPOFOL N/A 10/21/2019   Procedure: ESOPHAGOGASTRODUODENOSCOPY (EGD) WITH PROPOFOL;  Surgeon: Rachael Fee, MD;  Location: [facility];  Service: Endoscopy;  Laterality: N/A;   EUS N/A 10/21/2019   Procedure: UPPER ENDOSCOPIC ULTRASOUND (EUS) RADIAL;  Surgeon: Rachael Fee, MD;  Location: [facility];  Service: Endoscopy;  Laterality: N/A;   FINE NEEDLE ASPIRATION N/A 10/21/2019   Procedure: FINE NEEDLE ASPIRATION (FNA) LINEAR;  Surgeon: Rachael Fee, MD;  Location: [facility];  Service: Endoscopy;  Laterality: N/A;   HEMORRHOID SURGERY N/A 06/26/2020   Procedure: HEMORRHOIDECTOMY;  Surgeon: Duanne Guess, MD;  Location: [facility];  Service: General;  Laterality: N/A;    Social History   Tobacco Use   Smoking status: Former    Current packs/day: 0.00    Average packs/day: 0.5 packs/day for 10.0 years (5.0 ttl pk-yrs)    Types: Cigarettes    Start date: 30    Quit date: 2002    Years since quitting: 22.5    Passive exposure: Past   Smokeless tobacco: Never  Vaping Use   Vaping status: Never Used  Substance Use Topics   Alcohol use: Yes  Comment: socially   Drug use: Never     Medication list has been reviewed and updated.  Current Meds  Medication Sig   albuterol (VENTOLIN HFA) 108 (90 Base) MCG/ACT inhaler Inhale 1-2 puffs into the lungs every 6 (six) hours as needed.   atorvastatin (LIPITOR) 10 MG tablet Take 1 tablet (10 mg total) by mouth daily.   BIOTIN PO Take 1,000 mg by mouth daily.   Cholecalciferol (VITAMIN D-3) 125 MCG (5000 UT) TABS Take 125 mcg by mouth daily.   citalopram (CELEXA) 20 MG tablet Take 1 tablet (20 mg total) by mouth daily.   folic acid (FOLVITE) 800 MCG tablet Take 800 mcg by mouth daily.   Omega-3 Fatty Acids (FISH OIL PO) Take 1 capsule by  mouth daily.   omeprazole (PRILOSEC) 20 MG capsule TAKE 1 CAPSULE BY MOUTH TWICE DAILY BEFORE A MEAL   traZODone (DESYREL) 100 MG tablet Take 1 tablet (100 mg total) by mouth at bedtime as needed. for sleep   [DISCONTINUED] baclofen (LIORESAL) 10 MG tablet Take 1 tablet (10 mg total) by mouth 3 (three) times daily.   [DISCONTINUED] fluticasone furoate-vilanterol (BREO ELLIPTA) 100-25 MCG/ACT AEPB Inhale 1 puff into the lungs daily.   [DISCONTINUED] nystatin cream (MYCOSTATIN) Apply 1 Application topically 2 (two) times daily.       10/28/2022    9:13 AM 04/22/2022    3:00 PM 01/14/2022   11:32 AM 10/11/2021   10:00 AM  GAD 7 : Generalized Anxiety Score  Nervous, Anxious, on Edge 1 0 1 1  Control/stop worrying 1 0 0 1  Worry too much - different things 1 0 1 1  Trouble relaxing 0 0 1 1  Restless 1 0 1 1  Easily annoyed or irritable 0 0 1 1  Afraid - awful might happen 0 0 0 0  Total GAD 7 Score 4 0 5 6  Anxiety Difficulty Not difficult at all Not difficult at all Somewhat difficult Somewhat difficult       10/28/2022    9:13 AM 07/08/2022    8:46 AM 04/22/2022    3:00 PM  Depression screen PHQ 2/9  Decreased Interest 1 1 0  Down, Depressed, Hopeless 1 1 0  PHQ - 2 Score 2 2 0  Altered sleeping 0 0 0  Tired, decreased energy 1 1 0  Change in appetite 1 0 0  Feeling bad or failure about yourself  1 0 0  Trouble concentrating 0 0 0  Moving slowly or fidgety/restless 0 0 0  Suicidal thoughts 0 0 0  PHQ-9 Score 5 3 0  Difficult doing work/chores Not difficult at all Not difficult at all Not difficult at all    BP Readings from Last 3 Encounters:  10/28/22 112/64  07/08/22 110/78  04/22/22 110/76    Physical Exam Vitals and nursing note reviewed.  Constitutional:      General: She is not in acute distress.    Appearance: She is well-developed.  HENT:     Head: Normocephalic and atraumatic.     Right Ear: Tympanic membrane and ear canal normal.     Left Ear: Tympanic  membrane and ear canal normal.     Nose:     Right Sinus: No maxillary sinus tenderness.     Left Sinus: No maxillary sinus tenderness.  Eyes:     General: No scleral icterus.       Right eye: No discharge.        Left  eye: No discharge.     Conjunctiva/sclera: Conjunctivae normal.  Neck:     Thyroid: No thyromegaly.     Vascular: No carotid bruit.  Cardiovascular:     Rate and Rhythm: Normal rate and regular rhythm.     Pulses: Normal pulses.     Heart sounds: Normal heart sounds.  Pulmonary:     Effort: Pulmonary effort is normal. No respiratory distress.     Breath sounds: No wheezing.  Chest:  Breasts:    Right: No mass, nipple discharge, skin change or tenderness.     Left: No mass, nipple discharge, skin change or tenderness.  Abdominal:     General: Bowel sounds are normal.     Palpations: Abdomen is soft.     Tenderness: There is no abdominal tenderness.  Musculoskeletal:        General: Normal range of motion.     Cervical back: Normal range of motion. No erythema.     Right lower leg: No edema.     Left lower leg: No edema.  Lymphadenopathy:     Cervical: No cervical adenopathy.  Skin:    General: Skin is warm and dry.     Findings: No rash.  Neurological:     General: No focal deficit present.     Mental Status: She is alert and oriented to person, place, and time.     Cranial Nerves: No cranial nerve deficit.     Sensory: No sensory deficit.     Deep Tendon Reflexes: Reflexes are normal and symmetric.  Psychiatric:        Attention and Perception: Attention normal.        Mood and Affect: Mood normal.     Wt Readings from Last 3 Encounters:  10/28/22 154 lb (69.9 kg)  07/08/22 154 lb 12.8 oz (70.2 kg)  07/08/22 154 lb 12.8 oz (70.2 kg)    BP 112/64   Pulse 70   Temp 97.8 F (36.6 C) (Oral)   Ht 5\' 6"  (1.676 m)   Wt 154 lb (69.9 kg)   SpO2 97%   BMI 24.86 kg/m   Assessment and Plan:  Problem List Items Addressed This Visit        Unprioritized   Pulmonary hypertension (HCC)    Chronic mild shortness of breath unchanged Continues to use albuterol MDI prn      Major depressive disorder, single episode, unspecified (Chronic)    Clinically stable on current regimen with good control of symptoms, No SI or HI. No change in management at this time. Continue Celexa and Trazodone      GERD (gastroesophageal reflux disease) (Chronic)    Reflux symptoms are minimal on current therapy - omeprazole. No red flag signs such as weight loss, n/v, melena       Relevant Orders   CBC with Differential/Platelet   Aortic atherosclerosis (HCC) (Chronic)    LDL is  Lab Results  Component Value Date   LDLCALC 63 04/25/2021   Currently being treated with atorvastatin with good compliance and no concerns.       Relevant Orders   Comprehensive metabolic panel   Lipid panel   Other Visit Diagnoses     Annual physical exam    -  Primary   pt declines Shingrix vaccine today   Elevated TSH       Relevant Orders   TSH + free T4   Encounter for screening for osteoporosis       Relevant Orders  DG Bone Density   Viral illness       Covid test negative continue Tylenol, rest and fluids   Relevant Orders   POC COVID-19 BinaxNow (Completed)       No follow-ups on file.    Reubin Milan, MD [facility] [place]

## 2022-10-28 NOTE — Assessment & Plan Note (Signed)
Clinically stable on current regimen with good control of symptoms, No SI or HI. No change in management at this time. Continue Celexa and Trazodone

## 2022-10-28 NOTE — Patient Instructions (Signed)
Call [facility] to schedule your Bone Density at [phone].

## 2022-10-30 NOTE — Progress Notes (Signed)
PC to pt, discussed labs voiced understanding, appointment made for follow up

## 2022-11-14 ENCOUNTER — Other Ambulatory Visit: Payer: Self-pay | Admitting: Internal Medicine

## 2022-11-14 NOTE — Telephone Encounter (Signed)
Medication Refill - Medication: citalopram (CELEXA) 20 MG tablet [[id number]]   Pt reports that she took her last pill today.  Has the patient contacted their pharmacy? Yes.   (Agent: If no, request that the patient contact the pharmacy for the refill. If patient does not wish to contact the pharmacy document the reason why and proceed with request.) (Agent: If yes, when and what did the pharmacy advise?)  Preferred Pharmacy (with phone number or street name): [facility] - [place], [place] - [address] Phone: [phone]  Fax: [phone]   Has the patient been seen for an appointment in the last year OR does the patient have an upcoming appointment? Yes.    Agent: Please be advised that RX refills may take up to 3 business days. We ask that you follow-up with your pharmacy.

## 2022-11-15 MED ORDER — CITALOPRAM HYDROBROMIDE 20 MG PO TABS: 20.0000 mg | ORAL_TABLET | Freq: Every day | ORAL | 0 refills | Status: DC

## 2022-11-15 NOTE — Telephone Encounter (Signed)
Requested Prescriptions  Pending Prescriptions Disp Refills   citalopram (CELEXA) 20 MG tablet 90 tablet 0    Sig: Take 1 tablet (20 mg total) by mouth daily.     Psychiatry:  Antidepressants - SSRI Passed - 11/14/2022  1:46 PM      Passed - Completed PHQ-2 or PHQ-9 in the last 360 days      Passed - Valid encounter within last 6 months    Recent Outpatient Visits           2 weeks ago Annual physical exam   [place] [facility] at [facility], Nyoka Cowden, MD   4 months ago Acute right-sided low back pain without sciatica   [place] [facility] at [facility], Nyoka Cowden, MD   6 months ago Candidiasis of breast   [place] [facility], [facility]   10 months ago Sacroiliac joint pain    [facility] & [facility] Medicine at [facility], Nyoka Cowden, MD   1 year ago Dysuria   [facility] at [facility], Ocie Bob, MD       Future Appointments             In 3 months Judithann Graves Nyoka Cowden, MD [facility] at [facility], [facility]   In 11 months Judithann Graves, Nyoka Cowden, MD [facility] at [facility], [facility]

## 2022-11-29 ENCOUNTER — Other Ambulatory Visit: Payer: Self-pay | Admitting: Internal Medicine

## 2022-11-29 DIAGNOSIS — F325 Major depressive disorder, single episode, in full remission: Secondary | ICD-10-CM

## 2022-11-29 NOTE — Telephone Encounter (Signed)
Request is too soon, last refill 10/08/22 for 90 and 2 refills sent to [facility]. Patient needs to contact pharmacy.E-Prescribing Status: Receipt confirmed by pharmacy (10/08/2022 12:13 PM EDT).  Requested Prescriptions  Pending Prescriptions Disp Refills   traZODone (DESYREL) 100 MG tablet [Pharmacy Med Name: traZODone HCl 100 MG Oral Tablet] 90 tablet 0    Sig: TAKE 1 TABLET BY MOUTH AT BEDTIME AS NEEDED FOR SLEEP     Psychiatry: Antidepressants - Serotonin Modulator Passed - 11/29/2022  6:51 AM      Passed - Completed PHQ-2 or PHQ-9 in the last 360 days      Passed - Valid encounter within last 6 months    Recent Outpatient Visits           1 month ago Annual physical exam   [place] [facility] at [facility], Nyoka Cowden, MD   4 months ago Acute right-sided low back pain without sciatica   [place] [facility] at [facility], Nyoka Cowden, MD   7 months ago Candidiasis of breast   [facility], MD   10 months ago Sacroiliac joint pain   [place] [facility] at [facility], Nyoka Cowden, MD   1 year ago Dysuria   [facility] at [facility], Ocie Bob, MD       Future Appointments             In 2 months Judithann Graves, Nyoka Cowden, MD [facility] at [facility], [facility]   In 11 months Judithann Graves, Nyoka Cowden, MD [facility] Primary Care & [facility] Medicine at [facility], [facility]

## 2022-12-09 ENCOUNTER — Other Ambulatory Visit: Payer: Self-pay | Admitting: Internal Medicine

## 2022-12-09 DIAGNOSIS — K219 Gastro-esophageal reflux disease without esophagitis: Secondary | ICD-10-CM

## 2022-12-30 ENCOUNTER — Ambulatory Visit (INDEPENDENT_AMBULATORY_CARE_PROVIDER_SITE_OTHER): Payer: Medicare HMO | Admitting: Internal Medicine

## 2022-12-30 ENCOUNTER — Encounter: Payer: Self-pay | Admitting: Internal Medicine

## 2022-12-30 VITALS — BP 110/64 | HR 82 | Temp 97.8°F | Ht 66.0 in | Wt 154.0 lb

## 2022-12-30 DIAGNOSIS — J069 Acute upper respiratory infection, unspecified: Secondary | ICD-10-CM

## 2022-12-30 DIAGNOSIS — G4762 Sleep related leg cramps: Secondary | ICD-10-CM | POA: Diagnosis not present

## 2022-12-30 NOTE — Progress Notes (Signed)
Date:  12/30/2022   Name:  Claudia Gutierrez   DOB:  Oct 15, 1950   MRN:  [id number]   Chief Complaint: Sore Throat (Started 2-3 days. Hurts to swallow. Slight cough, with post nasal drainage. Has not done Covid Test.) and Ear Pain (Right ear x 2-3 days. )  URI  This is a new problem. The current episode started yesterday. There has been no fever. Associated symptoms include a plugged ear sensation, a sore throat and wheezing. Pertinent negatives include no chest pain, congestion, coughing, headaches, neck pain or sinus pain. She has tried nothing for the symptoms.    Lab Results  Component Value Date   NA 144 10/28/2022   K 4.5 10/28/2022   CO2 24 10/28/2022   GLUCOSE 114 (H) 10/28/2022   BUN 11 10/28/2022   CREATININE 0.84 10/28/2022   CALCIUM 8.9 10/28/2022   EGFR 74 10/28/2022   GFRNONAA >60 11/11/2020   Lab Results  Component Value Date   CHOL 187 10/28/2022   HDL 95 10/28/2022   LDLCALC 70 10/28/2022   TRIG 131 10/28/2022   CHOLHDL 2.0 10/28/2022   Lab Results  Component Value Date   TSH 5.040 (H) 10/28/2022   No results found for: "HGBA1C" Lab Results  Component Value Date   WBC 3.8 10/28/2022   HGB 12.8 10/28/2022   HCT 39.4 10/28/2022   MCV 95 10/28/2022   PLT 159 10/28/2022   Lab Results  Component Value Date   ALT 15 10/28/2022   AST 25 10/28/2022   ALKPHOS 75 10/28/2022   BILITOT 0.4 10/28/2022   No results found for: "25OHVITD2", "25OHVITD3", "VD25OH"   Review of Systems  Constitutional:  Negative for chills, fatigue and fever.  HENT:  Positive for sore throat. Negative for congestion and sinus pain.   Eyes:  Negative for visual disturbance.  Respiratory:  Positive for wheezing. Negative for cough and chest tightness.   Cardiovascular:  Negative for chest pain.  Musculoskeletal:  Positive for myalgias (leg cramps at night). Negative for neck pain.  Neurological:  Negative for dizziness and headaches.    Patient Active Problem List    Diagnosis Date Noted   Leg cramps, sleep related 12/30/2022   Pulmonary hypertension (HCC) 06/18/2021   Rectocele 05/22/2021   Dermatochalasis of both upper eyelids 07/06/2020   Ptosis of both eyebrows 07/06/2020   Aortic atherosclerosis (HCC) 04/16/2020   Arthralgia of right side of pelvis 04/14/2020   Palpitation 04/14/2020   History of colon polyps 10/05/2019   Pancreatic cyst 10/05/2019   GERD (gastroesophageal reflux disease) 10/12/2013   Major depressive disorder, single episode, unspecified 10/12/2013    Allergies  Allergen Reactions   Azithromycin Other (See Comments)    SWELLING/EDEMA   Paroxetine Hcl Nausea Only and Other (See Comments)    GI Upset   Ciprofloxacin Itching    Phlebitis    Sodium Clavulanate Diarrhea   Codeine Nausea And Vomiting    Past Surgical History:  Procedure Laterality Date   ACHILLES TENDON REPAIR  2008   ARTHRODESIS METATARSALPHALANGEAL JOINT (MTPJ) Left 10/18/2020   Procedure: ARTHRODESIS METATARSALPHALANGEAL JOINT (MTPJ);  Surgeon: Gwyneth Revels, DPM;  Location: [facility];  Service: Podiatry;  Laterality: Left;  Anesthesia- General with local   BIOPSY  10/21/2019   Procedure: BIOPSY;  Surgeon: Rachael Fee, MD;  Location: [facility];  Service: Endoscopy;;   BOTOX INJECTION N/A 06/26/2020   Procedure: BOTOX INJECTION into internal anal sphincter muscles;  Surgeon: Duanne Guess, MD;  Location: [facility];  Service: General;  Laterality: N/A;  Provider requesting 30 minutes for procedure   BREAST CYST ASPIRATION Right 2010   CATARACT EXTRACTION W/PHACO Right 12/21/2019   Procedure: CATARACT EXTRACTION PHACO AND INTRAOCULAR LENS PLACEMENT (IOC) RIGHT 4.15 00:31.2;  Surgeon: Galen Manila, MD;  Location: [facility];  Service: Ophthalmology;  Laterality: Right;   CATARACT EXTRACTION W/PHACO Left 01/11/2020   Procedure: CATARACT EXTRACTION PHACO AND INTRAOCULAR LENS PLACEMENT (IOC) LEFT 4.78 00:26.5;  Surgeon: Galen Manila, MD;  Location: [facility];  Service: Ophthalmology;  Laterality: Left;   COLONOSCOPY WITH PROPOFOL N/A 09/13/2019   Procedure: COLONOSCOPY WITH PROPOFOL;  Surgeon: Wyline Mood, MD;  Location: [facility];  Service: Gastroenterology;  Laterality: N/A;   ESOPHAGOGASTRODUODENOSCOPY (EGD) WITH PROPOFOL N/A 10/21/2019   Procedure: ESOPHAGOGASTRODUODENOSCOPY (EGD) WITH PROPOFOL;  Surgeon: Rachael Fee, MD;  Location: [facility];  Service: Endoscopy;  Laterality: N/A;   EUS N/A 10/21/2019   Procedure: UPPER ENDOSCOPIC ULTRASOUND (EUS) RADIAL;  Surgeon: Rachael Fee, MD;  Location: [facility];  Service: Endoscopy;  Laterality: N/A;   FINE NEEDLE ASPIRATION N/A 10/21/2019   Procedure: FINE NEEDLE ASPIRATION (FNA) LINEAR;  Surgeon: Rachael Fee, MD;  Location: [facility];  Service: Endoscopy;  Laterality: N/A;   HEMORRHOID SURGERY N/A 06/26/2020   Procedure: HEMORRHOIDECTOMY;  Surgeon: Duanne Guess, MD;  Location: [facility];  Service: General;  Laterality: N/A;    Social History   Tobacco Use   Smoking status: Former    Current packs/day: 0.00    Average packs/day: 0.5 packs/day for 10.0 years (5.0 ttl pk-yrs)    Types: Cigarettes    Start date: 5    Quit date: 2002    Years since quitting: 22.7    Passive exposure: Past   Smokeless tobacco: Never  Vaping Use   Vaping status: Never Used  Substance Use Topics   Alcohol use: Yes    Comment: socially   Drug use: Never     Medication list has been reviewed and updated.  Current Meds  Medication Sig   albuterol (VENTOLIN HFA) 108 (90 Base) MCG/ACT inhaler Inhale 1-2 puffs into the lungs every 6 (six) hours as needed.   atorvastatin (LIPITOR) 10 MG tablet Take 1 tablet (10 mg total) by mouth daily.   BIOTIN PO Take 1,000 mg by mouth daily.   Cholecalciferol (VITAMIN D-3) 125 MCG (5000 UT) TABS Take 125 mcg by mouth daily.   citalopram (CELEXA) 20 MG tablet Take 1 tablet (20 mg total) by mouth daily.    folic acid (FOLVITE) 800 MCG tablet Take 800 mcg by mouth daily.   Omega-3 Fatty Acids (FISH OIL PO) Take 1 capsule by mouth daily.   omeprazole (PRILOSEC) 20 MG capsule TAKE 1 CAPSULE BY MOUTH TWICE DAILY BEFORE A MEAL   traZODone (DESYREL) 100 MG tablet Take 1 tablet (100 mg total) by mouth at bedtime as needed. for sleep       12/30/2022   11:24 AM 10/28/2022    9:13 AM 04/22/2022    3:00 PM 01/14/2022   11:32 AM  GAD 7 : Generalized Anxiety Score  Nervous, Anxious, on Edge 0 1 0 1  Control/stop worrying 0 1 0 0  Worry too much - different things 0 1 0 1  Trouble relaxing 0 0 0 1  Restless 0 1 0 1  Easily annoyed or irritable 0 0 0 1  Afraid - awful might happen 0 0 0 0  Total GAD 7  Score 0 4 0 5  Anxiety Difficulty Not difficult at all Not difficult at all Not difficult at all Somewhat difficult       12/30/2022   11:24 AM 10/28/2022    9:13 AM 07/08/2022    8:46 AM  Depression screen PHQ 2/9  Decreased Interest 0 1 1  Down, Depressed, Hopeless 0 1 1  PHQ - 2 Score 0 2 2  Altered sleeping 1 0 0  Tired, decreased energy 1 1 1   Change in appetite 0 1 0  Feeling bad or failure about yourself  0 1 0  Trouble concentrating 0 0 0  Moving slowly or fidgety/restless 0 0 0  Suicidal thoughts 0 0 0  PHQ-9 Score 2 5 3   Difficult doing work/chores Not difficult at all Not difficult at all Not difficult at all    BP Readings from Last 3 Encounters:  12/30/22 110/64  10/28/22 112/64  07/08/22 110/78    Physical Exam Constitutional:      Appearance: She is well-developed.  HENT:     Right Ear: Tympanic membrane normal.     Left Ear: Tympanic membrane normal.     Nose: Congestion present.     Mouth/Throat:     Mouth: Mucous membranes are moist.     Pharynx: Posterior oropharyngeal erythema present.     Tonsils: No tonsillar exudate or tonsillar abscesses.  Eyes:     Conjunctiva/sclera: Conjunctivae normal.  Cardiovascular:     Rate and Rhythm: Normal rate and regular  rhythm.  Pulmonary:     Effort: Pulmonary effort is normal.     Breath sounds: No wheezing or rhonchi.  Musculoskeletal:     Cervical back: Normal range of motion.     Right lower leg: No edema.     Left lower leg: No edema.  Skin:    General: Skin is warm and dry.  Neurological:     General: No focal deficit present.     Mental Status: She is alert.  Psychiatric:        Mood and Affect: Mood normal.     Wt Readings from Last 3 Encounters:  12/30/22 154 lb (69.9 kg)  10/28/22 154 lb (69.9 kg)  07/08/22 154 lb 12.8 oz (70.2 kg)    BP 110/64   Pulse 82   Temp 97.8 F (36.6 C) (Oral)   Ht 5\' 6"  (1.676 m)   Wt 154 lb (69.9 kg)   SpO2 94%   BMI 24.86 kg/m   Assessment and Plan:  Problem List Items Addressed This Visit       Unprioritized   Leg cramps, sleep related    This is a new problem occurring more often. Medications reviewed with no potential culprits identified Recommend increased fluids and Magnesium 400 mg daily.      Other Visit Diagnoses     Upper respiratory tract infection, unspecified type    -  Primary   Expect self limited symptoms - try Allegra 180 mg daily; Tylenol and fluids doubt Covid and pt declines testing       No follow-ups on file.    Reubin Milan, MD [facility] [place]

## 2022-12-30 NOTE — Assessment & Plan Note (Addendum)
This is a new problem occurring more often. Medications reviewed with no potential culprits identified Recommend increased fluids and Magnesium 400 mg daily.

## 2022-12-30 NOTE — Patient Instructions (Addendum)
Allegra - one tablet daily for post nasal drainage  Push fluids and take magnesium to help reduce leg cramps  Call [facility] to schedule your Bone density at [phone].

## 2023-02-05 ENCOUNTER — Other Ambulatory Visit: Payer: Self-pay | Admitting: Internal Medicine

## 2023-02-05 DIAGNOSIS — I700 Atherosclerosis of aorta: Secondary | ICD-10-CM

## 2023-02-05 NOTE — Telephone Encounter (Signed)
Medication Refill - Most Recent Primary Care Visit:  Provider: Reubin Milan  Department: [facility]  Visit Type: OFFICE VISIT  Date: 12/30/2022  Medication: atorvastatin (LIPITOR) 10 MG tablet [[id number]]   Has the patient contacted their pharmacy? Yes  Has the prescription been filled recently? Yes  Is the patient out of the medication? No  Has the patient been seen for an appointment in the last year OR does the patient have an upcoming appointment? Yes  Can we respond through MyChart? Yes  Agent: Please be advised that Rx refills may take up to 3 business days. We ask that you follow-up with your pharmacy.  [facility] - [place], [place] - [address]  [address] [place] [address]  Phone: [phone] Fax: [phone]  Hours: Not open 24 hours

## 2023-02-06 ENCOUNTER — Telehealth: Payer: Self-pay | Admitting: Internal Medicine

## 2023-02-06 MED ORDER — ATORVASTATIN CALCIUM 10 MG PO TABS: 10.0000 mg | ORAL_TABLET | Freq: Every day | ORAL | 2 refills | Status: DC

## 2023-02-06 NOTE — Telephone Encounter (Signed)
Requested Prescriptions  Pending Prescriptions Disp Refills   atorvastatin (LIPITOR) 10 MG tablet 100 tablet 2    Sig: Take 1 tablet (10 mg total) by mouth daily.     Cardiovascular:  Antilipid - Statins Failed - 02/05/2023 11:57 AM      Failed - Lipid Panel in normal range within the last 12 months    Cholesterol, Total  Date Value Ref Range Status  10/28/2022 187 100 - 199 mg/dL Final   LDL Chol Calc (NIH)  Date Value Ref Range Status  10/28/2022 70 0 - 99 mg/dL Final   HDL  Date Value Ref Range Status  10/28/2022 95 >39 mg/dL Final   Triglycerides  Date Value Ref Range Status  10/28/2022 131 0 - 149 mg/dL Final         Passed - Patient is not pregnant      Passed - Valid encounter within last 12 months    Recent Outpatient Visits           1 month ago Upper respiratory tract infection, unspecified type   [place] [facility] at [facility], Nyoka Cowden, MD   3 months ago Annual physical exam   [facility] at [facility], Nyoka Cowden, MD   7 months ago Acute right-sided low back pain without sciatica   [place] [facility] at [facility], Nyoka Cowden, MD   9 months ago Candidiasis of breast   [place] [facility] at [facility], MD   1 year ago Sacroiliac joint pain   [place] [facility] at [facility], Nyoka Cowden, MD       Future Appointments             In 2 weeks Judithann Graves, Nyoka Cowden, MD [facility] at [facility], [facility]   In 8 months Judithann Graves, Nyoka Cowden, MD [facility] at [facility], [facility]

## 2023-02-06 NOTE — Telephone Encounter (Signed)
Pharmacy was refilled today.  - Claudia Gutierrez

## 2023-02-06 NOTE — Telephone Encounter (Signed)
Patient called stated she spoke with pharmacy earlier and was told they did not have the script for atorvastatin (LIPITOR) 10 MG tablet. Please f/u with [employer]

## 2023-02-11 DIAGNOSIS — H1131 Conjunctival hemorrhage, right eye: Secondary | ICD-10-CM | POA: Diagnosis not present

## 2023-02-24 ENCOUNTER — Ambulatory Visit: Payer: Medicare HMO | Admitting: Internal Medicine

## 2023-03-02 ENCOUNTER — Other Ambulatory Visit: Payer: Self-pay | Admitting: Internal Medicine

## 2023-03-02 MED ORDER — CITALOPRAM HYDROBROMIDE 20 MG PO TABS: 20.0000 mg | ORAL_TABLET | Freq: Every day | ORAL | 0 refills | Status: DC

## 2023-03-03 ENCOUNTER — Other Ambulatory Visit: Payer: Self-pay | Admitting: Internal Medicine

## 2023-03-03 DIAGNOSIS — K219 Gastro-esophageal reflux disease without esophagitis: Secondary | ICD-10-CM

## 2023-03-03 NOTE — Telephone Encounter (Signed)
Medication Refill -  Most Recent Primary Care Visit:  Provider: Reubin Milan  Department: [facility]  Visit Type: OFFICE VISIT  Date: 12/30/2022  Medication: omeprazole (PRILOSEC) 20 MG capsule   Has the patient contacted their pharmacy? Yes (Agent: If no, request that the patient contact the pharmacy for the refill. If patient does not wish to contact the pharmacy document the reason why and proceed with request.) (Agent: If yes, when and what did the pharmacy advise?)  Is this the correct pharmacy for this prescription? Yes If no, delete pharmacy and type the correct one.  This is the patient's preferred pharmacy: [facility] - [place], [place] - [address] [place] [address] Phone: [phone] Fax: [phone]  Has the prescription been filled recently? No  Is the patient out of the medication? No Pt has 1 pill remaining   Has the patient been seen for an appointment in the last year OR does the patient have an upcoming appointment? Yes  Can we respond through MyChart? Yes  Agent: Please be advised that Rx refills may take up to 3 business days. We ask that you follow-up with your pharmacy.

## 2023-03-06 MED ORDER — OMEPRAZOLE 20 MG PO CPDR
20.0000 mg | DELAYED_RELEASE_CAPSULE | Freq: Every day | ORAL | 1 refills | Status: DC
Start: 2023-03-06 — End: 2023-08-29

## 2023-03-06 NOTE — Telephone Encounter (Signed)
Requested by interface surescripts. Future visit in 7 months.  Requested Prescriptions  Pending Prescriptions Disp Refills   omeprazole (PRILOSEC) 20 MG capsule 180 capsule 1    Sig: Take 1 capsule (20 mg total) by mouth daily.     Gastroenterology: Proton Pump Inhibitors Passed - 03/03/2023  1:39 PM      Passed - Valid encounter within last 12 months    Recent Outpatient Visits           2 months ago Upper respiratory tract infection, unspecified type   [place] [facility] at [facility], Nyoka Cowden, MD   4 months ago Annual physical exam   [facility] at [facility], Nyoka Cowden, MD   8 months ago Acute right-sided low back pain without sciatica   [facility] at [facility], Nyoka Cowden, MD   10 months ago Candidiasis of breast   [place] [facility] at [facility], [facility]   1 year ago Sacroiliac joint pain    Primary Care & Sports Medicine at [facility], Nyoka Cowden, MD       Future Appointments             In 7 months Judithann Graves, Nyoka Cowden, MD [facility] at [facility], [facility]

## 2023-03-20 ENCOUNTER — Ambulatory Visit: Payer: Self-pay

## 2023-03-20 NOTE — Telephone Encounter (Signed)
Chief Complaint: Vaginal bleeding Postmenopausal  Symptoms: Spotting, abdominal pain, burning w/ urination Frequency: Comes and goes  Pertinent Negatives: Patient denies fever, headache, weakness, nausea, vomiting Disposition: [] ED /[] Urgent Care (no appt availability in office) / [x] Appointment(In office/virtual)/ []  [facility]/ [] Home Care/ [] Refused Recommended Disposition /[] [facility] [facility]/ []  Follow-up with PCP Additional Notes: Patient states she noticed some vaginal spotting that comes and goes about 1 week ago. Patient reports abdominal pain 5/10 and sometimes she has burning with urination. Patient unsure of what may be causing the spotting to occur. Care advice was given and patient was offered an appointment with PCP tomorrow but patient declined. Patient has been scheduled to be evaluated by PCP on 04/09/23 per patient's request. Advised to callback if symptoms get worse. Patient verbalized understanding.   Reason for Disposition  Postmenopausal vaginal bleeding  Answer Assessment - Initial Assessment Questions 1. AMOUNT: "Describe the bleeding that you are having." "How much bleeding is there?"    - SPOTTING: spotting, or pinkish / brownish mucous discharge; does not fill panty liner or pad    - MILD:  less than 1 pad / hour; less than patient's usual menstrual bleeding   - MODERATE: 1-2 pads / hour; 1 menstrual cup every 6 hours; small-medium blood clots (e.g., pea, grape, small coin)   - SEVERE: soaking 2 or more pads/hour for 2 or more hours; 1 menstrual cup every 2 hours; bleeding not contained by pads or continuous red blood from vagina; large blood clots (e.g., golf ball, large coin)      Spotting  2. ONSET: "When did the bleeding begin?" "Is it continuing now?"     1 week ago 3. MENOPAUSE: "When was your last menstrual period?"      Many years ago  4. ABDOMEN PAIN: "Do you have any pain?" "How bad is the pain?"  (e.g., Scale 1-10; mild, moderate, or  severe)   - MILD (1-3): doesn't interfere with normal activities, abdomen soft and not tender to touch    - MODERATE (4-7): interferes with normal activities or awakens from sleep, abdomen tender to touch    - SEVERE (8-10): excruciating pain, doubled over, unable to do any normal activities      5/10 5. BLOOD THINNERS: "Do you take any blood thinners?" (e.g., Coumadin/warfarin, Pradaxa/dabigatran, aspirin)     No  6. HORMONE MEDICINES: "Are you taking any hormone medicines, prescription or OTC?" (e.g., birth control pills, estrogen)     No  7. CAUSE: "What do you think is causing the bleeding?" (e.g., recent gyn surgery, recent gyn procedure; known bleeding disorder, uterine cancer)       I'm not sure  8. HEMODYNAMIC STATUS: "Are you weak or feeling lightheaded?" If Yes, ask: "Can you stand and walk normally?"       No  9. OTHER SYMPTOMS: "What other symptoms are you having with the bleeding?" (e.g., back pain, burning with urination, fever)     Fatigue, burning with urination  Protocols used: Vaginal Bleeding - Postmenopausal-A-AH

## 2023-04-09 ENCOUNTER — Ambulatory Visit: Payer: Medicare HMO | Admitting: Internal Medicine

## 2023-05-16 ENCOUNTER — Encounter: Payer: Self-pay | Admitting: Internal Medicine

## 2023-05-16 ENCOUNTER — Ambulatory Visit (INDEPENDENT_AMBULATORY_CARE_PROVIDER_SITE_OTHER): Payer: Medicare HMO | Admitting: Internal Medicine

## 2023-05-16 VITALS — BP 126/62 | HR 75 | Ht 66.0 in | Wt 149.0 lb

## 2023-05-16 DIAGNOSIS — F324 Major depressive disorder, single episode, in partial remission: Secondary | ICD-10-CM | POA: Diagnosis not present

## 2023-05-16 DIAGNOSIS — I272 Pulmonary hypertension, unspecified: Secondary | ICD-10-CM | POA: Diagnosis not present

## 2023-05-16 MED ORDER — ALBUTEROL SULFATE HFA 108 (90 BASE) MCG/ACT IN AERS: 1.0000 | INHALATION_SPRAY | Freq: Four times a day (QID) | RESPIRATORY_TRACT | 2 refills | Status: AC | PRN

## 2023-05-16 MED ORDER — FLUTICASONE FUROATE-VILANTEROL 100-25 MCG/ACT IN AEPB: 1.0000 | INHALATION_SPRAY | Freq: Every day | RESPIRATORY_TRACT | 11 refills | Status: DC

## 2023-05-16 NOTE — Patient Instructions (Signed)
Call Pulmonary clinic for follow up.

## 2023-05-16 NOTE — Progress Notes (Signed)
 Date:  05/16/2023   Name:  Claudia Gutierrez   DOB:  1950-07-31   MRN:  [id number]   Chief Complaint: Wheezing  Wheezing  This is a chronic problem. The current episode started more than 1 year ago. Episode frequency: comes and goes. The problem has been unchanged. Associated symptoms include shortness of breath. Pertinent negatives include no chest pain, chills, fever, headaches, sputum production or swollen glands. The symptoms are aggravated by lying flat. She has tried steroid inhaler for the symptoms. The treatment provided mild relief. Pulmonary diagnosed pulmonary HTN in 2023    Review of Systems  Constitutional:  Negative for chills, fever and unexpected weight change.  Respiratory:  Positive for shortness of breath and wheezing. Negative for sputum production and chest tightness.   Cardiovascular:  Negative for chest pain.  Neurological:  Negative for dizziness and headaches.  Psychiatric/Behavioral:  Negative for dysphoric mood and sleep disturbance. The patient is not nervous/anxious.      Lab Results  Component Value Date   NA 144 10/28/2022   K 4.5 10/28/2022   CO2 24 10/28/2022   GLUCOSE 114 (H) 10/28/2022   BUN 11 10/28/2022   CREATININE 0.84 10/28/2022   CALCIUM 8.9 10/28/2022   EGFR 74 10/28/2022   GFRNONAA >60 11/11/2020   Lab Results  Component Value Date   CHOL 187 10/28/2022   HDL 95 10/28/2022   LDLCALC 70 10/28/2022   TRIG 131 10/28/2022   CHOLHDL 2.0 10/28/2022   Lab Results  Component Value Date   TSH 5.040 (H) 10/28/2022   No results found for: "HGBA1C" Lab Results  Component Value Date   WBC 3.8 10/28/2022   HGB 12.8 10/28/2022   HCT 39.4 10/28/2022   MCV 95 10/28/2022   PLT 159 10/28/2022   Lab Results  Component Value Date   ALT 15 10/28/2022   AST 25 10/28/2022   ALKPHOS 75 10/28/2022   BILITOT 0.4 10/28/2022   No results found for: "25OHVITD2", "25OHVITD3", "VD25OH"   Patient Active Problem List   Diagnosis Date Noted    Leg cramps, sleep related 12/30/2022   Pulmonary hypertension (HCC) 06/18/2021   Rectocele 05/22/2021   Dermatochalasis of both upper eyelids 07/06/2020   Ptosis of both eyebrows 07/06/2020   Aortic atherosclerosis (HCC) 04/16/2020   Arthralgia of right side of pelvis 04/14/2020   Palpitation 04/14/2020   History of colon polyps 10/05/2019   Pancreatic cyst 10/05/2019   GERD (gastroesophageal reflux disease) 10/12/2013   Major depression single episode, 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    Allergies  Allergen Reactions   Azithromycin Other (See Comments)    SWELLING/EDEMA   Paroxetine Hcl Nausea Only and Other (See Comments)    GI Upset   Ciprofloxacin Itching    Phlebitis    Sodium Clavulanate Diarrhea   Codeine Nausea And Vomiting    Past Surgical History:  Procedure Laterality Date   ACHILLES TENDON REPAIR  2008   ARTHRODESIS METATARSALPHALANGEAL JOINT (MTPJ) Left 10/18/2020   Procedure: ARTHRODESIS METATARSALPHALANGEAL JOINT (MTPJ);  Surgeon: Gwyneth Revels, DPM;  Location: [facility];  Service: Podiatry;  Laterality: Left;  Anesthesia- General with local   BIOPSY  10/21/2019   Procedure: BIOPSY;  Surgeon: Rachael Fee, MD;  Location: [facility];  Service: Endoscopy;;   BOTOX INJECTION N/A 06/26/2020   Procedure: BOTOX INJECTION into internal anal sphincter muscles;  Surgeon: Duanne Guess, MD;  Location: [facility];  Service: General;  Laterality: N/A;  Provider requesting 30 minutes  for procedure   BREAST CYST ASPIRATION Right 2010   CATARACT EXTRACTION W/PHACO Right 12/21/2019   Procedure: CATARACT EXTRACTION PHACO AND INTRAOCULAR LENS PLACEMENT (IOC) RIGHT 4.15 00:31.2;  Surgeon: Galen Manila, MD;  Location: [facility];  Service: Ophthalmology;  Laterality: Right;   CATARACT EXTRACTION W/PHACO Left 01/11/2020   Procedure: CATARACT EXTRACTION PHACO AND INTRAOCULAR LENS PLACEMENT (IOC) LEFT 4.78 00:26.5;  Surgeon: Galen Manila, MD;   Location: [facility];  Service: Ophthalmology;  Laterality: Left;   COLONOSCOPY WITH PROPOFOL N/A 09/13/2019   Procedure: COLONOSCOPY WITH PROPOFOL;  Surgeon: Wyline Mood, MD;  Location: [facility];  Service: Gastroenterology;  Laterality: N/A;   ESOPHAGOGASTRODUODENOSCOPY (EGD) WITH PROPOFOL N/A 10/21/2019   Procedure: ESOPHAGOGASTRODUODENOSCOPY (EGD) WITH PROPOFOL;  Surgeon: Rachael Fee, MD;  Location: [facility];  Service: Endoscopy;  Laterality: N/A;   EUS N/A 10/21/2019   Procedure: UPPER ENDOSCOPIC ULTRASOUND (EUS) RADIAL;  Surgeon: Rachael Fee, MD;  Location: [facility];  Service: Endoscopy;  Laterality: N/A;   FINE NEEDLE ASPIRATION N/A 10/21/2019   Procedure: FINE NEEDLE ASPIRATION (FNA) LINEAR;  Surgeon: Rachael Fee, MD;  Location: [facility];  Service: Endoscopy;  Laterality: N/A;   HEMORRHOID SURGERY N/A 06/26/2020   Procedure: HEMORRHOIDECTOMY;  Surgeon: Duanne Guess, MD;  Location: [facility];  Service: General;  Laterality: N/A;    Social History   Tobacco Use   Smoking status: Former    Current packs/day: 0.00    Average packs/day: 0.5 packs/day for 10.0 years (5.0 ttl pk-yrs)    Types: Cigarettes    Start date: 65    Quit date: 2002    Years since quitting: 23.1    Passive exposure: Past   Smokeless tobacco: Never  Vaping Use   Vaping status: Never Used  Substance Use Topics   Alcohol use: Yes    Comment: socially   Drug use: Never     Medication list has been reviewed and updated.  Current Meds  Medication Sig   atorvastatin (LIPITOR) 10 MG tablet Take 1 tablet (10 mg total) by mouth daily.   BIOTIN PO Take 1,000 mg by mouth daily.   Cholecalciferol (VITAMIN D-3) 125 MCG (5000 UT) TABS Take 125 mcg by mouth daily.   citalopram (CELEXA) 20 MG tablet Take 1 tablet (20 mg total) by mouth daily.   fluticasone furoate-vilanterol (BREO ELLIPTA) 100-25 MCG/ACT AEPB Inhale 1 puff into the lungs daily.   folic acid (FOLVITE) 800 MCG  tablet Take 800 mcg by mouth daily.   omeprazole (PRILOSEC) 20 MG capsule Take 1 capsule (20 mg total) by mouth daily.   traZODone (DESYREL) 100 MG tablet Take 1 tablet (100 mg total) by mouth at bedtime as needed. for sleep   [DISCONTINUED] albuterol (VENTOLIN HFA) 108 (90 Base) MCG/ACT inhaler Inhale 1-2 puffs into the lungs every 6 (six) hours as needed.   [DISCONTINUED] Omega-3 Fatty Acids (FISH OIL PO) Take 1 capsule by mouth daily.       05/16/2023    3:32 PM 12/30/2022   11:24 AM 10/28/2022    9:13 AM 04/22/2022    3:00 PM  GAD 7 : Generalized Anxiety Score  Nervous, Anxious, on Edge 1 0 1 0  Control/stop worrying 0 0 1 0  Worry too much - different things 1 0 1 0  Trouble relaxing 0 0 0 0  Restless 0 0 1 0  Easily annoyed or irritable 0 0 0 0  Afraid - awful might happen 0 0 0 0  Total GAD 7 Score 2 0 4 0  Anxiety Difficulty Not difficult at all Not difficult at all Not difficult at all Not difficult at all       05/16/2023    3:32 PM 12/30/2022   11:24 AM 10/28/2022    9:13 AM  Depression screen PHQ 2/9  Decreased Interest 0 0 1  Down, Depressed, Hopeless 0 0 1  PHQ - 2 Score 0 0 2  Altered sleeping 1 1 0  Tired, decreased energy 1 1 1   Change in appetite 0 0 1  Feeling bad or failure about yourself  0 0 1  Trouble concentrating 0 0 0  Moving slowly or fidgety/restless 0 0 0  Suicidal thoughts 0 0 0  PHQ-9 Score 2 2 5   Difficult doing work/chores Not difficult at all Not difficult at all Not difficult at all    BP Readings from Last 3 Encounters:  05/16/23 126/62  12/30/22 110/64  10/28/22 112/64    Physical Exam Constitutional:      Appearance: Normal appearance.  Cardiovascular:     Rate and Rhythm: Normal rate and regular rhythm.  Pulmonary:     Effort: No respiratory distress.     Breath sounds: No wheezing or rhonchi.  Musculoskeletal:     Cervical back: Normal range of motion.     Right lower leg: No edema.     Left lower leg: No edema.   Neurological:     General: No focal deficit present.     Mental Status: She is alert.  Psychiatric:        Mood and Affect: Mood normal.        Behavior: Behavior normal.     Wt Readings from Last 3 Encounters:  05/16/23 149 lb (67.6 kg)  12/30/22 154 lb (69.9 kg)  10/28/22 154 lb (69.9 kg)    BP 126/62   Pulse 75   Ht 5\' 6"  (1.676 m)   Wt 149 lb (67.6 kg)   SpO2 95%   BMI 24.05 kg/m   Assessment and Plan:  Problem List Items Addressed This Visit       Unprioritized   Major depression single episode, in partial remission (HCC) - Primary   Clinically stable on Celexa with good response, No SI or HI reported. No change in management at this time.       Pulmonary hypertension (HCC)   Relevant Medications   fluticasone furoate-vilanterol (BREO ELLIPTA) 100-25 MCG/ACT AEPB   albuterol (VENTOLIN HFA) 108 (90 Base) MCG/ACT inhaler    Return for CPX scheduled.    Reubin Milan, MD [facility] [place]

## 2023-05-16 NOTE — Assessment & Plan Note (Signed)
Clinically stable on Celexa with good response, No SI or HI reported. No change in management at this time.

## 2023-06-11 ENCOUNTER — Encounter: Payer: Self-pay | Admitting: Internal Medicine

## 2023-06-11 ENCOUNTER — Ambulatory Visit (INDEPENDENT_AMBULATORY_CARE_PROVIDER_SITE_OTHER): Admitting: Internal Medicine

## 2023-06-11 VITALS — BP 136/78 | HR 81 | Ht 66.0 in | Wt 148.2 lb

## 2023-06-11 DIAGNOSIS — F324 Major depressive disorder, single episode, in partial remission: Secondary | ICD-10-CM

## 2023-06-11 DIAGNOSIS — M545 Low back pain, unspecified: Secondary | ICD-10-CM | POA: Diagnosis not present

## 2023-06-11 DIAGNOSIS — J029 Acute pharyngitis, unspecified: Secondary | ICD-10-CM | POA: Diagnosis not present

## 2023-06-11 MED ORDER — CITALOPRAM HYDROBROMIDE 20 MG PO TABS
20.0000 mg | ORAL_TABLET | Freq: Every day | ORAL | 1 refills | Status: DC
Start: 2023-06-11 — End: 2024-01-23

## 2023-06-11 MED ORDER — BACLOFEN 10 MG PO TABS: 10.0000 mg | ORAL_TABLET | Freq: Three times a day (TID) | ORAL | 0 refills | Status: AC

## 2023-06-11 NOTE — Progress Notes (Signed)
 Date:  06/11/2023   Name:  Claudia Gutierrez   DOB:  01/10/1951   MRN:  [id number]   Chief Complaint: Sore Throat (Patient said it's hard for her to eat) and Ear Pain (Patient said her right ear is painful, her left is starting to hurt)  Sore Throat  This is a new problem. The current episode started yesterday. The pain is worse on the right side. There has been no fever. The pain is moderate. Associated symptoms include ear pain. Pertinent negatives include no congestion, headaches or shortness of breath.    Review of Systems  Constitutional:  Negative for chills, fatigue and fever.  HENT:  Positive for ear pain and sore throat. Negative for congestion and facial swelling.   Respiratory:  Negative for chest tightness and shortness of breath.   Cardiovascular:  Negative for chest pain.  Musculoskeletal:  Positive for back pain.  Neurological:  Negative for dizziness and headaches.  Psychiatric/Behavioral:  Positive for dysphoric mood (mother passed away 2 days ago). Negative for sleep disturbance. The patient is nervous/anxious.      Lab Results  Component Value Date   NA 144 10/28/2022   K 4.5 10/28/2022   CO2 24 10/28/2022   GLUCOSE 114 (H) 10/28/2022   BUN 11 10/28/2022   CREATININE 0.84 10/28/2022   CALCIUM 8.9 10/28/2022   EGFR 74 10/28/2022   GFRNONAA >60 11/11/2020   Lab Results  Component Value Date   CHOL 187 10/28/2022   HDL 95 10/28/2022   LDLCALC 70 10/28/2022   TRIG 131 10/28/2022   CHOLHDL 2.0 10/28/2022   Lab Results  Component Value Date   TSH 5.040 (H) 10/28/2022   No results found for: "HGBA1C" Lab Results  Component Value Date   WBC 3.8 10/28/2022   HGB 12.8 10/28/2022   HCT 39.4 10/28/2022   MCV 95 10/28/2022   PLT 159 10/28/2022   Lab Results  Component Value Date   ALT 15 10/28/2022   AST 25 10/28/2022   ALKPHOS 75 10/28/2022   BILITOT 0.4 10/28/2022   No results found for: "25OHVITD2", "25OHVITD3", "VD25OH"   Patient Active  Problem List   Diagnosis Date Noted   Leg cramps, sleep related 12/30/2022   Pulmonary hypertension (HCC) 06/18/2021   Rectocele 05/22/2021   Dermatochalasis of both upper eyelids 07/06/2020   Ptosis of both eyebrows 07/06/2020   Aortic atherosclerosis (HCC) 04/16/2020   Arthralgia of right side of pelvis 04/14/2020   Palpitation 04/14/2020   History of colon polyps 10/05/2019   Pancreatic cyst 10/05/2019   GERD (gastroesophageal reflux disease) 10/12/2013   Major depression single episode, 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    Allergies  Allergen Reactions   Azithromycin Other (See Comments)    SWELLING/EDEMA   Paroxetine Hcl Nausea Only and Other (See Comments)    GI Upset   Ciprofloxacin Itching    Phlebitis    Sodium Clavulanate Diarrhea   Codeine Nausea And Vomiting    Past Surgical History:  Procedure Laterality Date   ACHILLES TENDON REPAIR  2008   ARTHRODESIS METATARSALPHALANGEAL JOINT (MTPJ) Left 10/18/2020   Procedure: ARTHRODESIS METATARSALPHALANGEAL JOINT (MTPJ);  Surgeon: Gwyneth Revels, DPM;  Location: [facility];  Service: Podiatry;  Laterality: Left;  Anesthesia- General with local   BIOPSY  10/21/2019   Procedure: BIOPSY;  Surgeon: Rachael Fee, MD;  Location: [facility];  Service: Endoscopy;;   BOTOX INJECTION N/A 06/26/2020   Procedure: BOTOX INJECTION into internal anal sphincter muscles;  Surgeon: Duanne Guess, MD;  Location: [facility];  Service: General;  Laterality: N/A;  Provider requesting 30 minutes for procedure   BREAST CYST ASPIRATION Right 2010   CATARACT EXTRACTION W/PHACO Right 12/21/2019   Procedure: CATARACT EXTRACTION PHACO AND INTRAOCULAR LENS PLACEMENT (IOC) RIGHT 4.15 00:31.2;  Surgeon: Galen Manila, MD;  Location: [facility];  Service: Ophthalmology;  Laterality: Right;   CATARACT EXTRACTION W/PHACO Left 01/11/2020   Procedure: CATARACT EXTRACTION PHACO AND INTRAOCULAR LENS PLACEMENT (IOC) LEFT 4.78  00:26.5;  Surgeon: Galen Manila, MD;  Location: [facility];  Service: Ophthalmology;  Laterality: Left;   COLONOSCOPY WITH PROPOFOL N/A 09/13/2019   Procedure: COLONOSCOPY WITH PROPOFOL;  Surgeon: Wyline Mood, MD;  Location: [facility];  Service: Gastroenterology;  Laterality: N/A;   ESOPHAGOGASTRODUODENOSCOPY (EGD) WITH PROPOFOL N/A 10/21/2019   Procedure: ESOPHAGOGASTRODUODENOSCOPY (EGD) WITH PROPOFOL;  Surgeon: Rachael Fee, MD;  Location: [facility];  Service: Endoscopy;  Laterality: N/A;   EUS N/A 10/21/2019   Procedure: UPPER ENDOSCOPIC ULTRASOUND (EUS) RADIAL;  Surgeon: Rachael Fee, MD;  Location: [facility];  Service: Endoscopy;  Laterality: N/A;   FINE NEEDLE ASPIRATION N/A 10/21/2019   Procedure: FINE NEEDLE ASPIRATION (FNA) LINEAR;  Surgeon: Rachael Fee, MD;  Location: [facility];  Service: Endoscopy;  Laterality: N/A;   HEMORRHOID SURGERY N/A 06/26/2020   Procedure: HEMORRHOIDECTOMY;  Surgeon: Duanne Guess, MD;  Location: [facility];  Service: General;  Laterality: N/A;    Social History   Tobacco Use   Smoking status: Former    Current packs/day: 0.00    Average packs/day: 0.5 packs/day for 10.0 years (5.0 ttl pk-yrs)    Types: Cigarettes    Start date: 60    Quit date: 2002    Years since quitting: 23.2    Passive exposure: Past   Smokeless tobacco: Never  Vaping Use   Vaping status: Never Used  Substance Use Topics   Alcohol use: Yes    Comment: socially   Drug use: Never     Medication list has been reviewed and updated.  Current Meds  Medication Sig   albuterol (VENTOLIN HFA) 108 (90 Base) MCG/ACT inhaler Inhale 1-2 puffs into the lungs every 6 (six) hours as needed.   atorvastatin (LIPITOR) 10 MG tablet Take 1 tablet (10 mg total) by mouth daily.   BIOTIN PO Take 1,000 mg by mouth daily.   omeprazole (PRILOSEC) 20 MG capsule Take 1 capsule (20 mg total) by mouth daily.   traZODone (DESYREL) 100 MG tablet Take 1 tablet  (100 mg total) by mouth at bedtime as needed. for sleep   [DISCONTINUED] citalopram (CELEXA) 20 MG tablet Take 1 tablet (20 mg total) by mouth daily.       06/11/2023    1:30 PM 05/16/2023    3:32 PM 12/30/2022   11:24 AM 10/28/2022    9:13 AM  GAD 7 : Generalized Anxiety Score  Nervous, Anxious, on Edge 2 1 0 1  Control/stop worrying 2 0 0 1  Worry too much - different things 2 1 0 1  Trouble relaxing 2 0 0 0  Restless 2 0 0 1  Easily annoyed or irritable 1 0 0 0  Afraid - awful might happen 2 0 0 0  Total GAD 7 Score 13 2 0 4  Anxiety Difficulty  Not difficult at all Not difficult at all Not difficult at all       06/11/2023    1:30 PM 05/16/2023    3:32  PM 12/30/2022   11:24 AM  Depression screen PHQ 2/9  Decreased Interest 2 0 0  Down, Depressed, Hopeless 2 0 0  PHQ - 2 Score 4 0 0  Altered sleeping  1 1  Tired, decreased energy 2 1 1   Change in appetite 2 0 0  Feeling bad or failure about yourself  2 0 0  Trouble concentrating 2 0 0  Moving slowly or fidgety/restless 2 0 0  Suicidal thoughts 2 0 0  PHQ-9 Score  2 2  Difficult doing work/chores  Not difficult at all Not difficult at all    BP Readings from Last 3 Encounters:  06/11/23 136/78  05/16/23 126/62  12/30/22 110/64    Physical Exam Constitutional:      General: She is not in acute distress. HENT:     Right Ear: Tympanic membrane and ear canal normal.     Left Ear: Tympanic membrane and ear canal normal.     Mouth/Throat:     Mouth: Mucous membranes are moist.     Pharynx: No pharyngeal swelling or oropharyngeal exudate.      Comments: Shallow aphthous ulcer and erythema Cardiovascular:     Rate and Rhythm: Normal rate and regular rhythm.  Pulmonary:     Effort: Pulmonary effort is normal.     Breath sounds: Normal breath sounds. No decreased breath sounds or wheezing.  Musculoskeletal:     Lumbar back: Tenderness (over right lower lumbar and SI region) present.  Lymphadenopathy:     Cervical:  Cervical adenopathy (R>L) present.  Neurological:     Mental Status: She is alert.     Wt Readings from Last 3 Encounters:  06/11/23 148 lb 4 oz (67.2 kg)  05/16/23 149 lb (67.6 kg)  12/30/22 154 lb (69.9 kg)    BP 136/78   Pulse 81   Ht 5\' 6"  (1.676 m)   Wt 148 lb 4 oz (67.2 kg)   SpO2 94%   BMI 23.93 kg/m   Assessment and Plan:  Problem List Items Addressed This Visit       Unprioritized   Major depression single episode, in partial remission (HCC)   Clinically stable on Celexa with good response, No SI or HI reported. Grieving her mother but otherwise doing well. No change in management at this time.       Relevant Medications   citalopram (CELEXA) 20 MG tablet   Other Visit Diagnoses       Pharyngitis, unspecified etiology    -  Primary   pain due to ulcer; no evidence of bacterial infection recommend Tylenol or Advil; throat lozenges, warm liquids     Recurrent low back pain       continue heat; take Tylenol add Baclofen primarily at bedtime   Relevant Medications   baclofen (LIORESAL) 10 MG tablet       No follow-ups on file.    Reubin Milan, MD [facility] [place]

## 2023-06-11 NOTE — Assessment & Plan Note (Signed)
 Clinically stable on Celexa with good response, No SI or HI reported. Grieving her mother but otherwise doing well. No change in management at this time.

## 2023-06-13 ENCOUNTER — Telehealth: Payer: Self-pay | Admitting: Internal Medicine

## 2023-06-13 NOTE — Telephone Encounter (Signed)
 Copied from CRM [id number]. Topic: Clinical - Medication Question >> Jun 13, 2023 12:28 PM DeAngela L wrote: Reason for CRM: Patient had appt on 06/11/23 and would like to ask if the Dr can call her in an antibiotic, after trying to gargle and cough drops with numbing medicine she is still having sore throat pain hard to swallow also bad right ear pain, Patient would like call back

## 2023-07-02 ENCOUNTER — Telehealth: Payer: Self-pay | Admitting: Internal Medicine

## 2023-07-02 NOTE — Telephone Encounter (Signed)
 Copied from CRM [id number]. Topic: Medicare AWV >> Jul 02, 2023  9:18 AM Payton Doughty wrote: Reason for CRM: Called LVM 07/02/2023 to schedule AWV. Please schedule Virtual or Telehealth visits ONLY.   Verlee Rossetti; Care Guide Ambulatory Clinical Support [place] [facility] Direct Dial: [phone]

## 2023-07-10 ENCOUNTER — Ambulatory Visit: Payer: Self-pay

## 2023-07-10 NOTE — Telephone Encounter (Deleted)
 Copied from CRM [id number]. Topic: Clinical - Medication Question >> Jul 10, 2023  2:45 PM Yolanda T wrote: Reason for CRM: patient called said she lost her mother 3 days ago and she needs a stronger medication to help with her nerves

## 2023-07-10 NOTE — Telephone Encounter (Signed)
  Chief Complaint: anxiety Symptoms: depressed, sad, upset, hard time grieving, trouble sleeping  Frequency: 3 days  Pertinent Negatives: NA Disposition: [] ED /[] Urgent Care (no appt availability in office) / [x] Appointment(In office/virtual)/ []  [facility]/ [] Home Care/ [] Refused Recommended Disposition /[]  Mobile Bus/ []  Follow-up with PCP Additional Notes: pt recently had passing away of mother and having hard time with grief and depression. Pt states she is on current medications but not effective. She doesn't like taking whole tab of trazodone and taking 1/2 tab doesn't help with sleep. Recommended being, pt declined coming in for appt so scheduled VV tomorrow at 1120.   Reason for Disposition  MODERATE anxiety (e.g., persistent or frequent anxiety symptoms; interferes with sleep, school, or work)  Answer Assessment - Initial Assessment Questions 1. CONCERN: "Did anything happen that prompted you to call today?"      Grief losing mother passed away 3 days  2. ANXIETY SYMPTOMS: "Can you describe how you (your loved one; patient) have been feeling?" (e.g., tense, restless, panicky, anxious, keyed up, overwhelmed, sense of impending doom).      Sad, upset, hard time processing  3. ONSET: "How long have you been feeling this way?" (e.g., hours, days, weeks)     3 days  4. SEVERITY: "How would you rate the level of anxiety?" (e.g., 0 - 10; or mild, moderate, severe).     Mild to moderate 5. FUNCTIONAL IMPAIRMENT: "How have these feelings affected your ability to do daily activities?" "Have you had more difficulty than usual doing your normal daily activities?" (e.g., getting better, same, worse; self-care, school, work, interactions)     Affecting daily activities  6. HISTORY: "Have you felt this way before?" "Have you ever been diagnosed with an anxiety problem in the past?" (e.g., generalized anxiety disorder, panic attacks, PTSD). If Yes, ask: "How was this problem  treated?" (e.g., medicines, counseling, etc.)     On citalopram and trazodone but not effective  7. RISK OF HARM - SUICIDAL IDEATION: "Do you ever have thoughts of hurting or killing yourself?" If Yes, ask:  "Do you have these feelings now?" "Do you have a plan on how you would do this?"     no 12. OTHER SYMPTOMS: "Do you have any other symptoms?" (e.g., feeling depressed, trouble concentrating, trouble sleeping, trouble breathing, palpitations or fast heartbeat, chest pain, sweating, nausea, or diarrhea)       Depressed, trouble sleeping  Protocols used: Anxiety and Panic Attack-A-AH

## 2023-07-10 NOTE — Telephone Encounter (Signed)
 Please review. Patient has an appointment for 07/11/2023.

## 2023-07-11 ENCOUNTER — Telehealth: Admitting: Internal Medicine

## 2023-07-11 ENCOUNTER — Encounter: Payer: Self-pay | Admitting: Internal Medicine

## 2023-07-11 DIAGNOSIS — F4320 Adjustment disorder, unspecified: Secondary | ICD-10-CM

## 2023-07-11 NOTE — Progress Notes (Signed)
 Date:  07/11/2023   Name:  Claudia Gutierrez   DOB:  [birth year]   MRN:  [id number]  This encounter was conducted via video encounter.  Patient was unable to connect so the encounter was via telephone.This platform was deemed appropriate for the issues to be addressed.  The patient was correctly identified.  I advised that I am conducting the visit from a secure room in my office at [facility] clinic.  The patient is located at home. The limitations of this form of encounter were discussed with the patient and he/she agreed to proceed.  Some vital signs will be absent.  Chief Complaint: Depression  Depression        This is a chronic problem.  The problem has been rapidly worsening (mother passed away suddenly three weeks ago) since onset.  Associated symptoms include fatigue, insomnia, decreased interest and sad.  Associated symptoms include no headaches and no suicidal ideas. Her mother passed away three weeks ago rather suddenly.  She continues to be very sad and withdrawn, has not been doing any of her usual activities.  She has pulled away from friends and does not get along with her sister.  She denies suicidal thoughts or plan.  She is not sleeping well and has not taken Trazodone - 100 mg causes vivid dreams but 50 mg does not.  She continues on Celexa.  Review of Systems  Constitutional:  Positive for fatigue. Negative for chills and fever.  Respiratory:  Negative for chest tightness, shortness of breath and wheezing.   Cardiovascular:  Negative for chest pain and palpitations.  Neurological:  Negative for dizziness and headaches.  Psychiatric/Behavioral:  Positive for depression, dysphoric mood and sleep disturbance. Negative for suicidal ideas. The patient has insomnia. The patient is not nervous/anxious.      Lab Results  Component Value Date   NA 144 10/28/2022   K 4.5 10/28/2022   CO2 24 10/28/2022   GLUCOSE 114 (H) 10/28/2022   BUN 11 10/28/2022   CREATININE 0.84  10/28/2022   CALCIUM 8.9 10/28/2022   EGFR 74 10/28/2022   GFRNONAA >60 11/11/2020   Lab Results  Component Value Date   CHOL 187 10/28/2022   HDL 95 10/28/2022   LDLCALC 70 10/28/2022   TRIG 131 10/28/2022   CHOLHDL 2.0 10/28/2022   Lab Results  Component Value Date   TSH 5.040 (H) 10/28/2022   No results found for: "HGBA1C" Lab Results  Component Value Date   WBC 3.8 10/28/2022   HGB 12.8 10/28/2022   HCT 39.4 10/28/2022   MCV 95 10/28/2022   PLT 159 10/28/2022   Lab Results  Component Value Date   ALT 15 10/28/2022   AST 25 10/28/2022   ALKPHOS 75 10/28/2022   BILITOT 0.4 10/28/2022   No results found for: "25OHVITD2", "25OHVITD3", "VD25OH"   Patient Active Problem List   Diagnosis Date Noted   Leg cramps, sleep related 12/30/2022   Pulmonary hypertension (HCC) 06/18/2021   Rectocele 05/22/2021   Dermatochalasis of both upper eyelids 07/06/2020   Ptosis of both eyebrows 07/06/2020   Aortic atherosclerosis (HCC) 04/16/2020   Arthralgia of right side of pelvis 04/14/2020   Palpitation 04/14/2020   History of colon polyps 10/05/2019   Pancreatic cyst 10/05/2019   GERD (gastroesophageal reflux disease) 10/12/2013   Major depression single episode, 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    Allergies  Allergen Reactions   Azithromycin Other (See Comments)    SWELLING/EDEMA   Paroxetine  Hcl Nausea Only and Other (See Comments)    GI Upset   Ciprofloxacin Itching    Phlebitis    Sodium Clavulanate Diarrhea   Codeine Nausea And Vomiting    Past Surgical History:  Procedure Laterality Date   ACHILLES TENDON REPAIR  2008   ARTHRODESIS METATARSALPHALANGEAL JOINT (MTPJ) Left 10/18/2020   Procedure: ARTHRODESIS METATARSALPHALANGEAL JOINT (MTPJ);  Surgeon: Gwyneth Revels, DPM;  Location: [facility];  Service: Podiatry;  Laterality: Left;  Anesthesia- General with local   BIOPSY  10/21/2019   Procedure: BIOPSY;  Surgeon: Rachael Fee, MD;  Location:  [facility];  Service: Endoscopy;;   BOTOX INJECTION N/A 06/26/2020   Procedure: BOTOX INJECTION into internal anal sphincter muscles;  Surgeon: Duanne Guess, MD;  Location: [facility] ORS;  Service: General;  Laterality: N/A;  Provider requesting 30 minutes for procedure   BREAST CYST ASPIRATION Right 2010   CATARACT EXTRACTION W/PHACO Right 12/21/2019   Procedure: CATARACT EXTRACTION PHACO AND INTRAOCULAR LENS PLACEMENT (IOC) RIGHT 4.15 00:31.2;  Surgeon: Galen Manila, MD;  Location: [facility];  Service: Ophthalmology;  Laterality: Right;   CATARACT EXTRACTION W/PHACO Left 01/11/2020   Procedure: CATARACT EXTRACTION PHACO AND INTRAOCULAR LENS PLACEMENT (IOC) LEFT 4.78 00:26.5;  Surgeon: Galen Manila, MD;  Location: [facility];  Service: Ophthalmology;  Laterality: Left;   COLONOSCOPY WITH PROPOFOL N/A 09/13/2019   Procedure: COLONOSCOPY WITH PROPOFOL;  Surgeon: Wyline Mood, MD;  Location[facility];  Service: Gastroenterology;  Laterality: N/A;   ESOPHAGOGASTRODUODENOSCOPY (EGD) WITH PROPOFOL N/A 10/21/2019   Procedure: ESOPHAGOGASTRODUODENOSCOPY (EGD) WITH PROPOFOL;  Surgeon: Rachael Fee, MD;  Location: [facility];  Service: Endoscopy;  Laterality: N/A;   EUS N/A 10/21/2019   Procedure: UPPER ENDOSCOPIC ULTRASOUND (EUS) RADIAL;  Surgeon: Rachael Fee, MD;  Location: [facility];  Service: Endoscopy;  Laterality: N/A;   FINE NEEDLE ASPIRATION N/A 10/21/2019   Procedure: FINE NEEDLE ASPIRATION (FNA) LINEAR;  Surgeon: Rachael Fee, MD;  Location: [facility];  Service: Endoscopy;  Laterality: N/A;   HEMORRHOID SURGERY N/A 06/26/2020   Procedure: HEMORRHOIDECTOMY;  Surgeon: Duanne Guess, MD;  Location: [facility] ORS;  Service: General;  Laterality: N/A;    Social History   Tobacco Use   Smoking status: Former    Current packs/day: 0.00    Average packs/day: 0.5 packs/day for 10.0 years (5.0 ttl pk-yrs)    Types: Cigarettes    Start date: 80     Quit date: 2002    Years since quitting: 23.2    Passive exposure: Past   Smokeless tobacco: Never  Vaping Use   Vaping status: Never Used  Substance Use Topics   Alcohol use: Yes    Comment: socially   Drug use: Never     Medication list has been reviewed and updated.  Current Meds  Medication Sig   albuterol (VENTOLIN HFA) 108 (90 Base) MCG/ACT inhaler Inhale 1-2 puffs into the lungs every 6 (six) hours as needed.   atorvastatin (LIPITOR) 10 MG tablet Take 1 tablet (10 mg total) by mouth daily.   baclofen (LIORESAL) 10 MG tablet Take 1 tablet (10 mg total) by mouth 3 (three) times daily.   BIOTIN PO Take 1,000 mg by mouth daily.   citalopram (CELEXA) 20 MG tablet Take 1 tablet (20 mg total) by mouth daily.   omeprazole (PRILOSEC) 20 MG capsule Take 1 capsule (20 mg total) by mouth daily.   traZODone (DESYREL) 100 MG tablet Take 1 tablet (100 mg total) by mouth at  bedtime as needed. for sleep       06/11/2023    1:30 PM 05/16/2023    3:32 PM 12/30/2022   11:24 AM 10/28/2022    9:13 AM  GAD 7 : Generalized Anxiety Score  Nervous, Anxious, on Edge 2 1 0 1  Control/stop worrying 2 0 0 1  Worry too much - different things 2 1 0 1  Trouble relaxing 2 0 0 0  Restless 2 0 0 1  Easily annoyed or irritable 1 0 0 0  Afraid - awful might happen 2 0 0 0  Total GAD 7 Score 13 2 0 4  Anxiety Difficulty  Not difficult at all Not difficult at all Not difficult at all       06/11/2023    1:30 PM 05/16/2023    3:32 PM 12/30/2022   11:24 AM  Depression screen PHQ 2/9  Decreased Interest 2 0 0  Down, Depressed, Hopeless 2 0 0  PHQ - 2 Score 4 0 0  Altered sleeping  1 1  Tired, decreased energy 2 1 1   Change in appetite 2 0 0  Feeling bad or failure about yourself  2 0 0  Trouble concentrating 2 0 0  Moving slowly or fidgety/restless 2 0 0  Suicidal thoughts 2 0 0  PHQ-9 Score  2 2  Difficult doing work/chores  Not difficult at all Not difficult at all    BP Readings from Last 3  Encounters:  06/11/23 136/78  05/16/23 126/62  12/30/22 110/64    Physical Exam Pulmonary:     Effort: Pulmonary effort is normal.  Psychiatric:        Attention and Perception: Attention normal.        Mood and Affect: Mood is depressed. Affect is tearful.        Speech: Speech normal.        Thought Content: Thought content does not include suicidal ideation. Thought content does not include suicidal plan.     Wt Readings from Last 3 Encounters:  06/11/23 148 lb 4 oz (67.2 kg)  05/16/23 149 lb (67.6 kg)  12/30/22 154 lb (69.9 kg)    There were no vitals taken for this visit.  Assessment and Plan:  Problem List Items Addressed This Visit   None Visit Diagnoses       Grief reaction    -  Primary   continue Celexa, add Trazodone 50 mg qhs encourage her to reach out to friends to help distract her and get her out of the house Crisis numbers given     Follow up if needed.  I spent 12 minutes on this encounter, 100% via telephone. No follow-ups on file.    Reubin Milan, MD [facility] [place]

## 2023-08-29 ENCOUNTER — Telehealth: Payer: Self-pay

## 2023-08-29 ENCOUNTER — Other Ambulatory Visit: Payer: Self-pay | Admitting: Internal Medicine

## 2023-08-29 DIAGNOSIS — K219 Gastro-esophageal reflux disease without esophagitis: Secondary | ICD-10-CM

## 2023-08-29 NOTE — Telephone Encounter (Signed)
 Copied from CRM [id number]. Topic: Clinical - Medication Question >> Aug 29, 2023 10:30 AM Claudia Gutierrez T wrote: Reason for CRM: patient called said she was not able to get the omeprazole  (PRILOSEC) 20 MG capsule until June 5th so she was asking if provider would give her enough to get her to that point. [facility] - [place], [place] - [address]  Phone: [phone] Fax: [phone] Please f/u with patient >> Aug 29, 2023 12:56 PM Felizardo Hotter wrote: Patient called said she was not able to get the omeprazole  (PRILOSEC) 20 MG capsule until June 5th so she was asking if provider would give her enough to get her to that point. [facility] - [place], [place] - [address] Phone: [phone] Fax:[phone]. Pt would like a call back at [phone]

## 2023-08-29 NOTE — Telephone Encounter (Signed)
 Please review.  KP  Copied from CRM [id number]. Topic: Clinical - Medication Question >> Aug 29, 2023 10:30 AM Claudia Gutierrez wrote: Reason for CRM: patient called said she was not able to get the omeprazole  (PRILOSEC) 20 MG capsule until June 5th so she was asking if provider would give her enough to get her to that point. [facility] - [place], [place] - [address]  Phone: [phone] Fax: [phone] Please f/u with patient

## 2023-08-29 NOTE — Telephone Encounter (Signed)
 Spoke with patient and informed her to go get Prilosec at the pharmacy. She verbalized understanding.  JM

## 2023-08-30 MED ORDER — OMEPRAZOLE 20 MG PO CPDR: 20.0000 mg | DELAYED_RELEASE_CAPSULE | Freq: Every day | ORAL | 1 refills | Status: DC

## 2023-08-30 NOTE — Telephone Encounter (Signed)
 Requested Prescriptions  Pending Prescriptions Disp Refills   omeprazole  (PRILOSEC) 20 MG capsule 180 capsule 1    Sig: Take 1 capsule (20 mg total) by mouth daily.     Gastroenterology: Proton Pump Inhibitors Passed - 08/30/2023  5:06 PM      Passed - Valid encounter within last 12 months    Recent Outpatient Visits           1 month ago Grief reaction   [place] [facility] at [facility], Chales [place], MD   2 months ago Pharyngitis, unspecified etiology   [place] [facility] at [facility], Chales Colorado, MD   3 months ago Major depression single episode, in partial remission [facility])   [place] [facility] Medicine at [facility], Chales [place], MD       Future Appointments             In 2 months Gala Jubilee, Chales Colorado, MD [facility] at [facility], [facility]

## 2023-10-07 ENCOUNTER — Telehealth: Payer: Self-pay

## 2023-10-07 NOTE — Telephone Encounter (Signed)
   Copied from CRM [id number]. Topic: Clinical - Medication Question >> Oct 06, 2023  3:20 PM Charlet HERO wrote: Reason for CRM: Patient is calling about her breast are hurting and they are sweating uderneath them she states that sometimes they are a light pink. She would like speak about breast agumentation and what she should do until then.

## 2023-10-07 NOTE — Telephone Encounter (Signed)
 Tried to call patient and schedule her a appt for evaluation. No answer, unable to leave VM. Patient needs appt to discuss breast augmentation.  JM

## 2023-10-15 ENCOUNTER — Ambulatory Visit: Admitting: Internal Medicine

## 2023-10-29 ENCOUNTER — Encounter: Payer: Self-pay | Admitting: Emergency Medicine

## 2023-10-30 ENCOUNTER — Encounter: Payer: Self-pay | Admitting: Internal Medicine

## 2023-11-07 ENCOUNTER — Ambulatory Visit: Payer: Self-pay

## 2023-11-07 NOTE — Telephone Encounter (Signed)
 Noted  KP

## 2023-11-07 NOTE — Telephone Encounter (Signed)
 FYI Only or Action Required?: FYI only for provider.  Patient was last seen in primary care on 07/11/2023 by Justus Leita DEL, MD.  Called Nurse Triage reporting Foot Swelling.  Symptoms began several days ago.  Interventions attempted: Other: elevation.  Symptoms are: unchanged.  Triage Disposition: See PCP When Office is Open (Within 3 Days)  Patient/caregiver understands and will follow disposition?: Yes  Offered appt for today states she can't come in today and requested appt for Monday.   Copied from CRM [id number]. Topic: Clinical - Red Word Triage >> Nov 07, 2023  9:01 AM Emylou G wrote: [facility] that prompted transfer to Nurse Triage: right foot swollen.. unsure if its fluid? Reason for Disposition  [1] MILD swelling of both ankles (i.e., pedal edema) AND [2] new-onset or getting worse  Answer Assessment - Initial Assessment Questions 1. ONSET: When did the swelling start? (e.g., minutes, hours, days)     2 days 2. LOCATION: What part of the leg is swollen?  Are both legs swollen or just one leg?     Just one foot- right foot 3. SEVERITY: How bad is the swelling? (e.g., localized; mild, moderate, severe)     Just slightly puffy 4. REDNESS: Is there redness or signs of infection?    no 5. PAIN: Is the swelling painful to touch? If Yes, ask: How painful is it?   (Scale 1-10; mild, moderate or severe)     Not really  6. FEVER: Do you have a fever? If Yes, ask: What is it, how was it measured, and when did it start?      no 7. CAUSE: What do you think is causing the leg swelling?     Had surgery on that foot years ago  8. MEDICAL HISTORY: Do you have a history of blood clots (e.g., DVT), cancer, heart failure, kidney disease, or liver failure?     no 9. RECURRENT SYMPTOM: Have you had leg swelling before? If Yes, ask: When was the last time? What happened that time?     No, 10. OTHER SYMPTOMS: Do you have any other symptoms? (e.g., chest pain,  difficulty breathing)       no  Protocols used: Leg Swelling and Edema-A-AH

## 2023-11-10 ENCOUNTER — Ambulatory Visit: Admitting: Student

## 2023-11-19 ENCOUNTER — Ambulatory Visit

## 2023-11-20 ENCOUNTER — Other Ambulatory Visit: Payer: Self-pay | Admitting: Internal Medicine

## 2023-11-20 DIAGNOSIS — I700 Atherosclerosis of aorta: Secondary | ICD-10-CM

## 2023-11-21 NOTE — Telephone Encounter (Signed)
 Requested Prescriptions  Pending Prescriptions Disp Refills   atorvastatin  (LIPITOR) 10 MG tablet [Pharmacy Med Name: ATORVASTATIN  10 MG TABLET] 100 tablet 0    Sig: Take 1 tablet (10 mg total) by mouth daily.     Cardiovascular:  Antilipid - Statins Failed - 11/21/2023 11:15 AM      Failed - Lipid Panel in normal range within the last 12 months    Cholesterol, Total  Date Value Ref Range Status  10/28/2022 187 100 - 199 mg/dL Final   LDL Chol Calc (NIH)  Date Value Ref Range Status  10/28/2022 70 0 - 99 mg/dL Final   HDL  Date Value Ref Range Status  10/28/2022 95 >39 mg/dL Final   Triglycerides  Date Value Ref Range Status  10/28/2022 131 0 - 149 mg/dL Final         Passed - Patient is not pregnant      Passed - Valid encounter within last 12 months    Recent Outpatient Visits           4 months ago Grief reaction   [place] [facility] at [facility], Leita DEL, MD   5 months ago Pharyngitis, unspecified etiology   [place] [facility] at [facility], Leita DEL, MD   6 months ago Major depression single episode, in partial remission [facility])   [place] [facility] at [facility], Leita DEL, MD       Future Appointments             In 3 days Justus Leita DEL, MD [facility] at [facility], [facility]

## 2023-11-24 ENCOUNTER — Encounter: Admitting: Internal Medicine

## 2023-11-24 ENCOUNTER — Encounter: Payer: Self-pay | Admitting: Internal Medicine

## 2023-11-24 NOTE — Assessment & Plan Note (Deleted)
 Reflux symptoms are controlled on omeprazole daily. Patient denies red flag symptoms - no melena, weight loss, dysphagia.

## 2023-11-24 NOTE — Progress Notes (Deleted)
 Date:  11/24/2023   Name:  Claudia Gutierrez   DOB:  [birth year]   MRN:  [id number]   Chief Complaint: No chief complaint on file. Claudia Gutierrez is a 73 y.o. female who presents today for her Complete Annual Exam. She feels {DESC; WELL/FAIRLY WELL/POORLY:18703}. She reports exercising ***. She reports she is sleeping {DESC; WELL/FAIRLY WELL/POORLY:18703}. Breast complaints ***.  Health Maintenance  Topic Date Due   DTaP/Tdap/Td vaccine (1 - Tdap) Never done   DEXA scan (bone density measurement)  Never done   Zoster (Shingles) Vaccine (2 of 2) 04/03/2020   COVID-19 Vaccine (3 - 2024-25 season) 12/01/2022   Medicare Annual Wellness Visit  07/08/2023   Mammogram  08/07/2023   Flu Shot  06/29/2024*   Colon Cancer Screening  09/12/2029   Pneumococcal Vaccine for age over 16  Completed   Hepatitis C Screening  Completed   HPV Vaccine  Aged Out   Meningitis B Vaccine  Aged Out  *Topic was postponed. The date shown is not the original due date.    Gastroesophageal Reflux She complains of heartburn. She reports no abdominal pain, no chest pain, no coughing or no wheezing. This is a recurrent problem. The problem occurs rarely. Pertinent negatives include no fatigue. She has tried a PPI for the symptoms.  Hyperlipidemia This is a chronic problem. The problem is controlled. Pertinent negatives include no chest pain, myalgias or shortness of breath. Current antihyperlipidemic treatment includes statins. The current treatment provides moderate improvement of lipids.  Depression        This is a chronic problem.The problem is unchanged.  Associated symptoms include no fatigue, no myalgias and no headaches.  Past treatments include SSRIs - Selective serotonin reuptake inhibitors.   Review of Systems  Constitutional:  Negative for fatigue and unexpected weight change.  HENT:  Negative for trouble swallowing.   Eyes:  Negative for visual disturbance.  Respiratory:  Negative for cough, chest  tightness, shortness of breath and wheezing.   Cardiovascular:  Negative for chest pain, palpitations and leg swelling.  Gastrointestinal:  Positive for heartburn. Negative for abdominal pain, constipation and diarrhea.  Musculoskeletal:  Negative for arthralgias and myalgias.  Neurological:  Negative for dizziness, weakness, light-headedness and headaches.  Psychiatric/Behavioral:  Positive for depression.      Lab Results  Component Value Date   NA 144 10/28/2022   K 4.5 10/28/2022   CO2 24 10/28/2022   GLUCOSE 114 (H) 10/28/2022   BUN 11 10/28/2022   CREATININE 0.84 10/28/2022   CALCIUM  8.9 10/28/2022   EGFR 74 10/28/2022   GFRNONAA >60 11/11/2020   Lab Results  Component Value Date   CHOL 187 10/28/2022   HDL 95 10/28/2022   LDLCALC 70 10/28/2022   TRIG 131 10/28/2022   CHOLHDL 2.0 10/28/2022   Lab Results  Component Value Date   TSH 5.040 (H) 10/28/2022   No results found for: HGBA1C Lab Results  Component Value Date   WBC 3.8 10/28/2022   HGB 12.8 10/28/2022   HCT 39.4 10/28/2022   MCV 95 10/28/2022   PLT 159 10/28/2022   Lab Results  Component Value Date   ALT 15 10/28/2022   AST 25 10/28/2022   ALKPHOS 75 10/28/2022   BILITOT 0.4 10/28/2022   No results found for: MARIEN BOLLS, VD25OH   Patient Active Problem List   Diagnosis Date Noted   Leg cramps, sleep related 12/30/2022   Pulmonary hypertension (HCC) 06/18/2021   Rectocele 05/22/2021  Dermatochalasis of both upper eyelids 07/06/2020   Ptosis of both eyebrows 07/06/2020   Aortic atherosclerosis (HCC) 04/16/2020   Arthralgia of right side of pelvis 04/14/2020   Palpitation 04/14/2020   History of colon polyps 10/05/2019   Pancreatic cyst 10/05/2019   GERD (gastroesophageal reflux disease) 10/12/2013   Major depression single episode, 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    Allergies  Allergen Reactions   Azithromycin Other (See Comments)    SWELLING/EDEMA   Paroxetine  Hcl Nausea Only and Other (See Comments)    GI Upset   Ciprofloxacin  Itching    Phlebitis    Sodium Clavulanate Diarrhea   Codeine Nausea And Vomiting    Past Surgical History:  Procedure Laterality Date   ACHILLES TENDON REPAIR  2008   ARTHRODESIS METATARSALPHALANGEAL JOINT (MTPJ) Left 10/18/2020   Procedure: ARTHRODESIS METATARSALPHALANGEAL JOINT (MTPJ);  Surgeon: Ashley Soulier, DPM;  Location: [facility];  Service: Podiatry;  Laterality: Left;  Anesthesia- General with local   BIOPSY  10/21/2019   Procedure: BIOPSY;  Surgeon: Teressa Toribio SQUIBB, MD;  Location: [facility];  Service: Endoscopy;;   BOTOX  INJECTION N/A 06/26/2020   Procedure: BOTOX  INJECTION into internal anal sphincter muscles;  Surgeon: Marolyn Nest, MD;  Location: [facility];  Service: General;  Laterality: N/A;  Provider requesting 30 minutes for procedure   BREAST CYST ASPIRATION Right 2010   CATARACT EXTRACTION W/PHACO Right 12/21/2019   Procedure: CATARACT EXTRACTION PHACO AND INTRAOCULAR LENS PLACEMENT (IOC) RIGHT 4.15 00:31.2;  Surgeon: Jaye Fallow, MD;  Location: [facility];  Service: Ophthalmology;  Laterality: Right;   CATARACT EXTRACTION W/PHACO Left 01/11/2020   Procedure: CATARACT EXTRACTION PHACO AND INTRAOCULAR LENS PLACEMENT (IOC) LEFT 4.78 00:26.5;  Surgeon: Jaye Fallow, MD;  Location: [facility];  Service: Ophthalmology;  Laterality: Left;   COLONOSCOPY WITH PROPOFOL  N/A 09/13/2019   Procedure: COLONOSCOPY WITH PROPOFOL ;  Surgeon: Therisa Bi, MD;  Location: [facility];  Service: Gastroenterology;  Laterality: N/A;   ESOPHAGOGASTRODUODENOSCOPY (EGD) WITH PROPOFOL  N/A 10/21/2019   Procedure: ESOPHAGOGASTRODUODENOSCOPY (EGD) WITH PROPOFOL ;  Surgeon: Teressa Toribio SQUIBB, MD;  Location: [facility];  Service: Endoscopy;  Laterality: N/A;   EUS N/A 10/21/2019   Procedure: UPPER ENDOSCOPIC ULTRASOUND (EUS) RADIAL;  Surgeon: Teressa Toribio SQUIBB, MD;  Location: [facility];   Service: Endoscopy;  Laterality: N/A;   FINE NEEDLE ASPIRATION N/A 10/21/2019   Procedure: FINE NEEDLE ASPIRATION (FNA) LINEAR;  Surgeon: Teressa Toribio SQUIBB, MD;  Location: [facility];  Service: Endoscopy;  Laterality: N/A;   HEMORRHOID SURGERY N/A 06/26/2020   Procedure: HEMORRHOIDECTOMY;  Surgeon: Marolyn Nest, MD;  Location: [facility];  Service: General;  Laterality: N/A;    Social History   Tobacco Use   Smoking status: Former    Current packs/day: 0.00    Average packs/day: 0.5 packs/day for 10.0 years (5.0 ttl pk-yrs)    Types: Cigarettes    Start date: 67    Quit date: 2002    Years since quitting: 23.6    Passive exposure: Past   Smokeless tobacco: Never  Vaping Use   Vaping status: Never Used  Substance Use Topics   Alcohol use: Yes    Comment: socially   Drug use: Never     Medication list has been reviewed and updated.  No outpatient medications have been marked as taking for the 11/24/23 encounter (Appointment) with Justus Leita DEL, MD.       06/11/2023    1:30 PM 05/16/2023    3:32 PM 12/30/2022  11:24 AM 10/28/2022    9:13 AM  GAD 7 : Generalized Anxiety Score  Nervous, Anxious, on Edge 2 1 0 1  Control/stop worrying 2 0 0 1  Worry too much - different things 2 1 0 1  Trouble relaxing 2 0 0 0  Restless 2 0 0 1  Easily annoyed or irritable 1 0 0 0  Afraid - awful might happen 2 0 0 0  Total GAD 7 Score 13 2 0 4  Anxiety Difficulty  Not difficult at all Not difficult at all Not difficult at all       06/11/2023    1:30 PM 05/16/2023    3:32 PM 12/30/2022   11:24 AM  Depression screen PHQ 2/9  Decreased Interest 2 0 0  Down, Depressed, Hopeless 2 0 0  PHQ - 2 Score 4 0 0  Altered sleeping  1 1  Tired, decreased energy 2 1 1   Change in appetite 2 0 0  Feeling bad or failure about yourself  2 0 0  Trouble concentrating 2 0 0  Moving slowly or fidgety/restless 2 0 0  Suicidal thoughts 2 0 0  PHQ-9 Score  2 2  Difficult doing work/chores  Not  difficult at all Not difficult at all    BP Readings from Last 3 Encounters:  06/11/23 136/78  05/16/23 126/62  12/30/22 110/64    Physical Exam Vitals and nursing note reviewed.  Constitutional:      General: She is not in acute distress.    Appearance: She is well-developed.  HENT:     Head: Normocephalic and atraumatic.     Right Ear: Tympanic membrane and ear canal normal.     Left Ear: Tympanic membrane and ear canal normal.     Nose:     Right Sinus: No maxillary sinus tenderness.     Left Sinus: No maxillary sinus tenderness.  Eyes:     General: No scleral icterus.       Right eye: No discharge.        Left eye: No discharge.     Conjunctiva/sclera: Conjunctivae normal.  Neck:     Thyroid : No thyromegaly.     Vascular: No carotid bruit.  Cardiovascular:     Rate and Rhythm: Normal rate and regular rhythm.     Pulses: Normal pulses.     Heart sounds: Normal heart sounds.  Pulmonary:     Effort: Pulmonary effort is normal. No respiratory distress.     Breath sounds: No wheezing.  Abdominal:     General: Bowel sounds are normal.     Palpations: Abdomen is soft.     Tenderness: There is no abdominal tenderness.  Musculoskeletal:     Cervical back: Normal range of motion. No erythema.     Right lower leg: No edema.     Left lower leg: No edema.  Lymphadenopathy:     Cervical: No cervical adenopathy.  Skin:    General: Skin is warm and dry.     Findings: No rash.  Neurological:     Mental Status: She is alert and oriented to person, place, and time.     Cranial Nerves: No cranial nerve deficit.     Sensory: No sensory deficit.     Deep Tendon Reflexes: Reflexes are normal and symmetric.  Psychiatric:        Attention and Perception: Attention normal.        Mood and Affect: Mood normal.  Wt Readings from Last 3 Encounters:  06/11/23 148 lb 4 oz (67.2 kg)  05/16/23 149 lb (67.6 kg)  12/30/22 154 lb (69.9 kg)    There were no vitals taken for this  visit.  Assessment and Plan:  Problem List Items Addressed This Visit       Unprioritized   GERD (gastroesophageal reflux disease) (Chronic)   Reflux symptoms are controlled on omeprazole  daily. Patient denies red flag symptoms - no melena, weight loss, dysphagia.       Aortic atherosclerosis (HCC) (Chronic)   LDL is  Lab Results  Component Value Date   LDLCALC 70 10/28/2022   Currently taking atorvastatin  10 mg.  No medication side effects or other concerns. Recommended LDL goal is < 55.       Major depression single episode, in partial remission (HCC)   Clinically stable on Celexa .   No SI or HI on evaluation. Plan to continue same medications for now.       Other Visit Diagnoses       Annual physical exam    -  Primary     Encounter for screening mammogram for breast cancer         Encounter for screening for osteoporosis           No follow-ups on file.    Leita HILARIO Adie, MD [facility] [place]

## 2023-11-24 NOTE — Assessment & Plan Note (Deleted)
 LDL is  Lab Results  Component Value Date   LDLCALC 70 10/28/2022   Currently taking atorvastatin  10 mg.  No medication side effects or other concerns. Recommended LDL goal is < 55.

## 2023-11-24 NOTE — Assessment & Plan Note (Deleted)
 Clinically stable on Celexa.   No SI or HI on evaluation. Plan to continue same medications for now.

## 2023-12-05 ENCOUNTER — Other Ambulatory Visit: Payer: Self-pay | Admitting: Internal Medicine

## 2023-12-05 DIAGNOSIS — K219 Gastro-esophageal reflux disease without esophagitis: Secondary | ICD-10-CM

## 2023-12-05 NOTE — Telephone Encounter (Signed)
 Copied from CRM [id number]. Topic: Clinical - Medication Refill >> Dec 05, 2023  8:39 AM Willma R wrote: Medication: omeprazole  (PRILOSEC) 20 MG capsule  Has the patient contacted their pharmacy? Yes, call dr  This is the patient's preferred pharmacy:  [facility] - [place], [place] - [address] [place] [address] Phone: [phone] Fax: [phone]  Is this the correct pharmacy for this prescription? Yes If no, delete pharmacy and type the correct one.   Has the prescription been filled recently? No  Is the patient out of the medication? No, 2 left  Has the patient been seen for an appointment in the last year OR does the patient have an upcoming appointment? Yes  Can we respond through MyChart? Yes  Agent: Please be advised that Rx refills may take up to 3 business days. We ask that you follow-up with your pharmacy.

## 2023-12-08 ENCOUNTER — Other Ambulatory Visit: Payer: Self-pay

## 2023-12-08 DIAGNOSIS — K219 Gastro-esophageal reflux disease without esophagitis: Secondary | ICD-10-CM

## 2023-12-08 MED ORDER — OMEPRAZOLE 20 MG PO CPDR: 20.0000 mg | DELAYED_RELEASE_CAPSULE | Freq: Every day | ORAL | 1 refills | Status: DC

## 2023-12-08 NOTE — Telephone Encounter (Signed)
 Pt called to report that she has one pill left and already received the refill that was written 08/30/2023. Pt is requesting to be notified once this is submitted.

## 2024-01-08 ENCOUNTER — Other Ambulatory Visit: Payer: Self-pay | Admitting: Internal Medicine

## 2024-01-08 DIAGNOSIS — F325 Major depressive disorder, single episode, in full remission: Secondary | ICD-10-CM

## 2024-01-08 NOTE — Telephone Encounter (Signed)
 Copied from CRM #[id number]. Topic: Clinical - Medication Refill >> Jan 08, 2024  9:10 AM Amy B wrote: Medication:  traZODone  (DESYREL ) 100 MG tablet  Has the patient contacted their pharmacy? Yes (Agent: If no, request that the patient contact the pharmacy for the refill. If patient does not wish to contact the pharmacy document the reason why and proceed with request.) (Agent: If yes, when and what did the pharmacy advise?)  This is the patient's preferred pharmacy:  [facility] - [place], [place] - [address] [place] [address] Phone: [phone] Fax: [phone]  Is this the correct pharmacy for this prescription? Yes If no, delete pharmacy and type the correct one.   Has the prescription been filled recently? No  Is the patient out of the medication? No  Has the patient been seen for an appointment in the last year OR does the patient have an upcoming appointment? Yes  Can we respond through MyChart? Yes  Agent: Please be advised that Rx refills may take up to 3 business days. We ask that you follow-up with your pharmacy.

## 2024-01-09 NOTE — Telephone Encounter (Signed)
 Requested medications are due for refill today.  yes  Requested medications are on the active medications list. yes  Last refill. 10/08/2022 #90 2 rf  Future visit scheduled.   yes  Notes to clinic.  Pt last seen in office 06/11/2023. Pt has missed several appts.    Requested Prescriptions  Pending Prescriptions Disp Refills   traZODone  (DESYREL ) 100 MG tablet 90 tablet 2    Sig: Take 1 tablet (100 mg total) by mouth at bedtime as needed. for sleep     Psychiatry: Antidepressants - Serotonin Modulator Failed - 01/09/2024  4:20 PM      Failed - Valid encounter within last 6 months    Recent Outpatient Visits           6 months ago Grief reaction   [place] [facility] at [facility], Leita DEL, MD   7 months ago Pharyngitis, unspecified etiology   [place] [facility] at [facility], Leita DEL, MD   7 months ago Major depression single episode, in partial remission   [facility] at [facility], Leita DEL, MD              Passed - Completed PHQ-2 or PHQ-9 in the last 360 days

## 2024-01-12 MED ORDER — TRAZODONE HCL 100 MG PO TABS: 100.0000 mg | ORAL_TABLET | Freq: Every evening | ORAL | 0 refills | Status: AC | PRN

## 2024-01-14 ENCOUNTER — Other Ambulatory Visit: Payer: Self-pay | Admitting: Internal Medicine

## 2024-01-14 NOTE — Progress Notes (Unsigned)
 Date:  01/14/2024   Name:  Claudia Gutierrez   DOB:  01/19/1951   MRN:  [id number]   Chief Complaint: No chief complaint on file.  HPI  Review of Systems   Lab Results  Component Value Date   NA 144 10/28/2022   K 4.5 10/28/2022   CO2 24 10/28/2022   GLUCOSE 114 (H) 10/28/2022   BUN 11 10/28/2022   CREATININE 0.84 10/28/2022   CALCIUM  8.9 10/28/2022   EGFR 74 10/28/2022   GFRNONAA >60 11/11/2020   Lab Results  Component Value Date   CHOL 187 10/28/2022   HDL 95 10/28/2022   LDLCALC 70 10/28/2022   TRIG 131 10/28/2022   CHOLHDL 2.0 10/28/2022   Lab Results  Component Value Date   TSH 5.040 (H) 10/28/2022   No results found for: HGBA1C Lab Results  Component Value Date   WBC 3.8 10/28/2022   HGB 12.8 10/28/2022   HCT 39.4 10/28/2022   MCV 95 10/28/2022   PLT 159 10/28/2022   Lab Results  Component Value Date   ALT 15 10/28/2022   AST 25 10/28/2022   ALKPHOS 75 10/28/2022   BILITOT 0.4 10/28/2022   No results found for: MARIEN BOLLS, VD25OH   Patient Active Problem List   Diagnosis Date Noted   Leg cramps, sleep related 12/30/2022   Pulmonary hypertension (HCC) 06/18/2021   Rectocele 05/22/2021   Dermatochalasis of both upper eyelids 07/06/2020   Ptosis of both eyebrows 07/06/2020   Aortic atherosclerosis 04/16/2020   Arthralgia of right side of pelvis 04/14/2020   Palpitation 04/14/2020   History of colon polyps 10/05/2019   Pancreatic cyst 10/05/2019   GERD (gastroesophageal reflux disease) 10/12/2013   Major depression single episode, in partial remission 10/12/2013    Allergies  Allergen Reactions   Azithromycin Other (See Comments)    SWELLING/EDEMA   Paroxetine Hcl Nausea Only and Other (See Comments)    GI Upset   Ciprofloxacin  Itching    Phlebitis    Sodium Clavulanate Diarrhea   Codeine Nausea And Vomiting    Past Surgical History:  Procedure Laterality Date   ACHILLES TENDON REPAIR  2008   ARTHRODESIS  METATARSALPHALANGEAL JOINT (MTPJ) Left 10/18/2020   Procedure: ARTHRODESIS METATARSALPHALANGEAL JOINT (MTPJ);  Surgeon: Ashley Soulier, DPM;  Location: [facility];  Service: Podiatry;  Laterality: Left;  Anesthesia- General with local   BIOPSY  10/21/2019   Procedure: BIOPSY;  Surgeon: Teressa Toribio SQUIBB, MD;  Location: [facility];  Service: Endoscopy;;   BOTOX  INJECTION N/A 06/26/2020   Procedure: BOTOX  INJECTION into internal anal sphincter muscles;  Surgeon: Marolyn Nest, MD;  Location: [facility];  Service: General;  Laterality: N/A;  Provider requesting 30 minutes for procedure   BREAST CYST ASPIRATION Right 2010   CATARACT EXTRACTION W/PHACO Right 12/21/2019   Procedure: CATARACT EXTRACTION PHACO AND INTRAOCULAR LENS PLACEMENT (IOC) RIGHT 4.15 00:31.2;  Surgeon: Jaye Fallow, MD;  Location: [facility];  Service: Ophthalmology;  Laterality: Right;   CATARACT EXTRACTION W/PHACO Left 01/11/2020   Procedure: CATARACT EXTRACTION PHACO AND INTRAOCULAR LENS PLACEMENT (IOC) LEFT 4.78 00:26.5;  Surgeon: Jaye Fallow, MD;  Location: [facility];  Service: Ophthalmology;  Laterality: Left;   COLONOSCOPY WITH PROPOFOL  N/A 09/13/2019   Procedure: COLONOSCOPY WITH PROPOFOL ;  Surgeon: Therisa Bi, MD;  Location: [facility];  Service: Gastroenterology;  Laterality: N/A;   ESOPHAGOGASTRODUODENOSCOPY (EGD) WITH PROPOFOL  N/A 10/21/2019   Procedure: ESOPHAGOGASTRODUODENOSCOPY (EGD) WITH PROPOFOL ;  Surgeon: Teressa Toribio SQUIBB, MD;  Location: [facility];  Service: Endoscopy;  Laterality: N/A;   EUS N/A 10/21/2019   Procedure: UPPER ENDOSCOPIC ULTRASOUND (EUS) RADIAL;  Surgeon: Teressa Toribio SQUIBB, MD;  Location: [facility];  Service: Endoscopy;  Laterality: N/A;   FINE NEEDLE ASPIRATION N/A 10/21/2019   Procedure: FINE NEEDLE ASPIRATION (FNA) LINEAR;  Surgeon: Teressa Toribio SQUIBB, MD;  Location: [facility];  Service: Endoscopy;  Laterality: N/A;   HEMORRHOID SURGERY N/A 06/26/2020    Procedure: HEMORRHOIDECTOMY;  Surgeon: Marolyn Nest, MD;  Location: [facility];  Service: General;  Laterality: N/A;    Social History   Tobacco Use   Smoking status: Former    Current packs/day: 0.00    Average packs/day: 0.5 packs/day for 10.0 years (5.0 ttl pk-yrs)    Types: Cigarettes    Start date: 39    Quit date: 2002    Years since quitting: 23.8    Passive exposure: Past   Smokeless tobacco: Never  Vaping Use   Vaping status: Never Used  Substance Use Topics   Alcohol use: Yes    Comment: socially   Drug use: Never     Medication list has been reviewed and updated.  No outpatient medications have been marked as taking for the 01/14/24 encounter (Orders Only) with [name] DEL, MD.       06/11/2023    1:30 PM 05/16/2023    3:32 PM 12/30/2022   11:24 AM 10/28/2022    9:13 AM  GAD 7 : Generalized Anxiety Score  Nervous, Anxious, on Edge 2 1 0 1  Control/stop worrying 2 0 0 1  Worry too much - different things 2 1 0 1  Trouble relaxing 2 0 0 0  Restless 2 0 0 1  Easily annoyed or irritable 1 0 0 0  Afraid - awful might happen 2 0 0 0  Total GAD 7 Score 13 2 0 4  Anxiety Difficulty  Not difficult at all Not difficult at all Not difficult at all       06/11/2023    1:30 PM 05/16/2023    3:32 PM 12/30/2022   11:24 AM  Depression screen PHQ 2/9  Decreased Interest 2 0 0  Down, Depressed, Hopeless 2 0 0  PHQ - 2 Score 4 0 0  Altered sleeping  1 1  Tired, decreased energy 2 1 1   Change in appetite 2 0 0  Feeling bad or failure about yourself  2 0 0  Trouble concentrating 2 0 0  Moving slowly or fidgety/restless 2 0 0  Suicidal thoughts 2 0 0  PHQ-9 Score  2 2  Difficult doing work/chores  Not difficult at all Not difficult at all    BP Readings from Last 3 Encounters:  06/11/23 136/78  05/16/23 126/62  12/30/22 110/64    Physical Exam  Wt Readings from Last 3 Encounters:  06/11/23 148 lb 4 oz (67.2 kg)  05/16/23 149 lb (67.6 kg)   12/30/22 154 lb (69.9 kg)    There were no vitals taken for this visit.  Assessment and Plan:  Problem List Items Addressed This Visit   None   No follow-ups on file.    Claudia HILARIO Justus, MD [facility] [place]

## 2024-01-15 ENCOUNTER — Encounter: Payer: Self-pay | Admitting: Internal Medicine

## 2024-01-15 ENCOUNTER — Ambulatory Visit (INDEPENDENT_AMBULATORY_CARE_PROVIDER_SITE_OTHER): Admitting: Internal Medicine

## 2024-01-15 VITALS — BP 118/62 | HR 69 | Ht 66.0 in | Wt 153.0 lb

## 2024-01-15 DIAGNOSIS — E034 Atrophy of thyroid (acquired): Secondary | ICD-10-CM | POA: Insufficient documentation

## 2024-01-15 DIAGNOSIS — Z23 Encounter for immunization: Secondary | ICD-10-CM | POA: Diagnosis not present

## 2024-01-15 DIAGNOSIS — Z13820 Encounter for screening for osteoporosis: Secondary | ICD-10-CM | POA: Diagnosis not present

## 2024-01-15 DIAGNOSIS — F324 Major depressive disorder, single episode, in partial remission: Secondary | ICD-10-CM

## 2024-01-15 DIAGNOSIS — Z0000 Encounter for general adult medical examination without abnormal findings: Secondary | ICD-10-CM

## 2024-01-15 DIAGNOSIS — I272 Pulmonary hypertension, unspecified: Secondary | ICD-10-CM | POA: Diagnosis not present

## 2024-01-15 DIAGNOSIS — K219 Gastro-esophageal reflux disease without esophagitis: Secondary | ICD-10-CM

## 2024-01-15 DIAGNOSIS — I700 Atherosclerosis of aorta: Secondary | ICD-10-CM | POA: Diagnosis not present

## 2024-01-15 DIAGNOSIS — Z1231 Encounter for screening mammogram for malignant neoplasm of breast: Secondary | ICD-10-CM

## 2024-01-15 NOTE — Assessment & Plan Note (Signed)
 Currently taking omeprazole  with minimal reflux symptoms. Patient denies red flag symptoms - no melena, weight loss, dysphagia. Will maintain current management.

## 2024-01-15 NOTE — Assessment & Plan Note (Signed)
 Last TSH was elevated and T4 was low. She never returned for follow up labs. Will do those today.

## 2024-01-15 NOTE — Assessment & Plan Note (Signed)
 Depression and anxiety symptoms are stable and well controlled on Celexa . No SI/HI reported. I recommend continuing the same medical regimen.

## 2024-01-15 NOTE — Assessment & Plan Note (Signed)
 Evaluated by Pulmonary in 2023.  Given Breo sample but also referred to Cardiology due to ECHO findings. She never went.  She has albuterol  MDI but rarely uses it. Will give sample of Breztri. Refer to Cardiology.

## 2024-01-15 NOTE — Progress Notes (Signed)
 Date:  01/15/2024   Name:  Claudia Gutierrez   DOB:  28-Feb-1951   MRN:  [id number]   Chief Complaint: Annual Exam Claudia Gutierrez is a 73 y.o. female who presents today for her Complete Annual Exam. She feels fairly well. She reports exercising- none. She reports she is sleeping fairly well. Breast complaints - none.  Health Maintenance  Topic Date Due   DTaP/Tdap/Td vaccine (1 - Tdap) Never done   DEXA scan (bone density measurement)  Never done   Medicare Annual Wellness Visit  07/08/2023   Breast Cancer Screening  08/07/2023   COVID-19 Vaccine (3 - 2025-26 season) 01/31/2024*   Zoster (Shingles) Vaccine (2 of 2) 04/16/2024*   Colon Cancer Screening  09/12/2029   Pneumococcal Vaccine for age over 27  Completed   Flu Shot  Completed   Hepatitis C Screening  Completed   Meningitis B Vaccine  Aged Out  *Topic was postponed. The date shown is not the original due date.    Hyperlipidemia This is a chronic problem. The problem is controlled. Pertinent negatives include no chest pain, myalgias or shortness of breath. Current antihyperlipidemic treatment includes statins.  Depression        This is a chronic problem.The problem is unchanged.  Associated symptoms include no fatigue, no myalgias and no headaches.  Past treatments include SSRIs - Selective serotonin reuptake inhibitors.  Compliance with treatment is good.  Previous treatment provided significant relief.  Past medical history includes thyroid  problem.   Gastroesophageal Reflux She complains of heartburn. She reports no abdominal pain, no chest pain, no coughing or no wheezing. This is a recurrent problem. The problem occurs rarely. Pertinent negatives include no fatigue. She has tried a PPI for the symptoms.  Thyroid  Problem Presents for follow-up (early hypothyroidism last year - did not return for follow up labs) visit. Patient reports no constipation, diarrhea, fatigue or palpitations. Her past medical history is  significant for hyperlipidemia.    Review of Systems  Constitutional:  Negative for fatigue and unexpected weight change.  HENT:  Negative for trouble swallowing.   Eyes:  Negative for visual disturbance.  Respiratory:  Negative for cough, chest tightness, shortness of breath and wheezing.   Cardiovascular:  Negative for chest pain, palpitations and leg swelling.  Gastrointestinal:  Positive for heartburn. Negative for abdominal pain, constipation and diarrhea.  Musculoskeletal:  Negative for arthralgias and myalgias.  Neurological:  Negative for dizziness, weakness, light-headedness and headaches.  Psychiatric/Behavioral:  Positive for depression.      Lab Results  Component Value Date   NA 144 10/28/2022   K 4.5 10/28/2022   CO2 24 10/28/2022   GLUCOSE 114 (H) 10/28/2022   BUN 11 10/28/2022   CREATININE 0.84 10/28/2022   CALCIUM  8.9 10/28/2022   EGFR 74 10/28/2022   GFRNONAA >60 11/11/2020   Lab Results  Component Value Date   CHOL 187 10/28/2022   HDL 95 10/28/2022   LDLCALC 70 10/28/2022   TRIG 131 10/28/2022   CHOLHDL 2.0 10/28/2022   Lab Results  Component Value Date   TSH 5.040 (H) 10/28/2022   No results found for: HGBA1C Lab Results  Component Value Date   WBC 3.8 10/28/2022   HGB 12.8 10/28/2022   HCT 39.4 10/28/2022   MCV 95 10/28/2022   PLT 159 10/28/2022   Lab Results  Component Value Date   ALT 15 10/28/2022   AST 25 10/28/2022   ALKPHOS 75 10/28/2022   BILITOT 0.4  10/28/2022   No results found for: MARIEN BOLLS, VD25OH   Patient Active Problem List   Diagnosis Date Noted   Hypothyroidism due to acquired atrophy of thyroid  01/15/2024   Leg cramps, sleep related 12/30/2022   Pulmonary hypertension (HCC) 06/18/2021   Rectocele 05/22/2021   Dermatochalasis of both upper eyelids 07/06/2020   Ptosis of both eyebrows 07/06/2020   Aortic atherosclerosis 04/16/2020   Arthralgia of right side of pelvis 04/14/2020   Palpitation  04/14/2020   History of colon polyps 10/05/2019   Pancreatic cyst 10/05/2019   GERD (gastroesophageal reflux disease) 10/12/2013   Major depression single episode, in partial remission 10/12/2013    Allergies  Allergen Reactions   Azithromycin Other (See Comments)    SWELLING/EDEMA   Paroxetine Hcl Nausea Only and Other (See Comments)    GI Upset   Ciprofloxacin  Itching    Phlebitis    Sodium Clavulanate Diarrhea   Codeine Nausea And Vomiting    Past Surgical History:  Procedure Laterality Date   ACHILLES TENDON REPAIR  2008   ARTHRODESIS METATARSALPHALANGEAL JOINT (MTPJ) Left 10/18/2020   Procedure: ARTHRODESIS METATARSALPHALANGEAL JOINT (MTPJ);  Surgeon: Ashley Soulier, DPM;  Location: [facility];  Service: Podiatry;  Laterality: Left;  Anesthesia- General with local   BIOPSY  10/21/2019   Procedure: BIOPSY;  Surgeon: Teressa Toribio SQUIBB, MD;  Location: [facility];  Service: Endoscopy;;   BOTOX  INJECTION N/A 06/26/2020   Procedure: BOTOX  INJECTION into internal anal sphincter muscles;  Surgeon: Marolyn Nest, MD;  Location: [facility];  Service: General;  Laterality: N/A;  Provider requesting 30 minutes for procedure   BREAST CYST ASPIRATION Right 2010   CATARACT EXTRACTION W/PHACO Right 12/21/2019   Procedure: CATARACT EXTRACTION PHACO AND INTRAOCULAR LENS PLACEMENT (IOC) RIGHT 4.15 00:31.2;  Surgeon: Jaye Fallow, MD;  Location: [facility];  Service: Ophthalmology;  Laterality: Right;   CATARACT EXTRACTION W/PHACO Left 01/11/2020   Procedure: CATARACT EXTRACTION PHACO AND INTRAOCULAR LENS PLACEMENT (IOC) LEFT 4.78 00:26.5;  Surgeon: Jaye Fallow, MD;  Location: [facility];  Service: Ophthalmology;  Laterality: Left;   COLONOSCOPY WITH PROPOFOL  N/A 09/13/2019   Procedure: COLONOSCOPY WITH PROPOFOL ;  Surgeon: Therisa Bi, MD;  Location: [facility];  Service: Gastroenterology;  Laterality: N/A;   ESOPHAGOGASTRODUODENOSCOPY (EGD) WITH PROPOFOL   N/A 10/21/2019   Procedure: ESOPHAGOGASTRODUODENOSCOPY (EGD) WITH PROPOFOL ;  Surgeon: Teressa Toribio SQUIBB, MD;  Location: [facility];  Service: Endoscopy;  Laterality: N/A;   EUS N/A 10/21/2019   Procedure: UPPER ENDOSCOPIC ULTRASOUND (EUS) RADIAL;  Surgeon: Teressa Toribio SQUIBB, MD;  Location: [facility];  Service: Endoscopy;  Laterality: N/A;   FINE NEEDLE ASPIRATION N/A 10/21/2019   Procedure: FINE NEEDLE ASPIRATION (FNA) LINEAR;  Surgeon: Teressa Toribio SQUIBB, MD;  Location: [facility];  Service: Endoscopy;  Laterality: N/A;   HEMORRHOID SURGERY N/A 06/26/2020   Procedure: HEMORRHOIDECTOMY;  Surgeon: Marolyn Nest, MD;  Location: [facility];  Service: General;  Laterality: N/A;    Social History   Tobacco Use   Smoking status: Former    Current packs/day: 0.00    Average packs/day: 0.5 packs/day for 10.0 years (5.0 ttl pk-yrs)    Types: Cigarettes    Start date: 33    Quit date: 2002    Years since quitting: 23.8    Passive exposure: Past   Smokeless tobacco: Never  Vaping Use   Vaping status: Never Used  Substance Use Topics   Alcohol use: Yes    Comment: socially   Drug use: Never  Medication list has been reviewed and updated.  Current Meds  Medication Sig   albuterol  (VENTOLIN  HFA) 108 (90 Base) MCG/ACT inhaler Inhale 1-2 puffs into the lungs every 6 (six) hours as needed.   atorvastatin  (LIPITOR) 10 MG tablet Take 1 tablet (10 mg total) by mouth daily.   baclofen  (LIORESAL ) 10 MG tablet Take 1 tablet (10 mg total) by mouth 3 (three) times daily.   BIOTIN PO Take 1,000 mg by mouth daily.   citalopram  (CELEXA ) 20 MG tablet Take 1 tablet (20 mg total) by mouth daily.   omeprazole  (PRILOSEC) 20 MG capsule Take 1 capsule (20 mg total) by mouth daily.   traZODone  (DESYREL ) 100 MG tablet Take 1 tablet (100 mg total) by mouth at bedtime as needed. for sleep       01/15/2024   10:15 AM 06/11/2023    1:30 PM 05/16/2023    3:32 PM 12/30/2022   11:24 AM  GAD 7 : Generalized  Anxiety Score  Nervous, Anxious, on Edge 0 2 1 0  Control/stop worrying 0 2 0 0  Worry too much - different things 0 2 1 0  Trouble relaxing 0 2 0 0  Restless 0 2 0 0  Easily annoyed or irritable 0 1 0 0  Afraid - awful might happen 0 2 0 0  Total GAD 7 Score 0 13 2 0  Anxiety Difficulty Not difficult at all  Not difficult at all Not difficult at all       01/15/2024   10:14 AM 06/11/2023    1:30 PM 05/16/2023    3:32 PM  Depression screen PHQ 2/9  Decreased Interest 0 2 0  Down, Depressed, Hopeless 0 2 0  PHQ - 2 Score 0 4 0  Altered sleeping 0  1  Tired, decreased energy 0 2 1  Change in appetite 0 2 0  Feeling bad or failure about yourself  0 2 0  Trouble concentrating 0 2 0  Moving slowly or fidgety/restless 0 2 0  Suicidal thoughts 0 2 0  PHQ-9 Score 0  2  Difficult doing work/chores Not difficult at all  Not difficult at all    BP Readings from Last 3 Encounters:  01/15/24 118/62  06/11/23 136/78  05/16/23 126/62    Physical Exam Vitals and nursing note reviewed.  Constitutional:      General: She is not in acute distress.    Appearance: She is well-developed.  HENT:     Head: Normocephalic and atraumatic.     Right Ear: Tympanic membrane and ear canal normal.     Left Ear: Tympanic membrane and ear canal normal.     Nose:     Right Sinus: No maxillary sinus tenderness.     Left Sinus: No maxillary sinus tenderness.  Eyes:     General: No scleral icterus.       Right eye: No discharge.        Left eye: No discharge.     Conjunctiva/sclera: Conjunctivae normal.  Neck:     Thyroid : No thyromegaly.     Vascular: No carotid bruit.  Cardiovascular:     Rate and Rhythm: Normal rate and regular rhythm.     Pulses: Normal pulses.     Heart sounds: Normal heart sounds.  Pulmonary:     Effort: Pulmonary effort is normal. No respiratory distress.     Breath sounds: No wheezing.  Abdominal:     General: Bowel sounds are normal.  Palpations: Abdomen is  soft.     Tenderness: There is no abdominal tenderness.  Musculoskeletal:     Cervical back: Normal range of motion. No erythema.     Right lower leg: No edema.     Left lower leg: No edema.  Lymphadenopathy:     Cervical: No cervical adenopathy.  Skin:    General: Skin is warm and dry.     Findings: No rash.  Neurological:     Mental Status: She is alert and oriented to person, place, and time.     Cranial Nerves: No cranial nerve deficit.     Sensory: No sensory deficit.     Deep Tendon Reflexes: Reflexes are normal and symmetric.  Psychiatric:        Attention and Perception: Attention normal.        Mood and Affect: Mood normal.     Wt Readings from Last 3 Encounters:  01/15/24 153 lb (69.4 kg)  06/11/23 148 lb 4 oz (67.2 kg)  05/16/23 149 lb (67.6 kg)    BP 118/62   Pulse 69   Ht 5' 6 (1.676 m)   Wt 153 lb (69.4 kg)   SpO2 97%   BMI 24.69 kg/m   Assessment and Plan:  Problem List Items Addressed This Visit       Unprioritized   GERD (gastroesophageal reflux disease) (Chronic)   Currently taking omeprazole  with minimal reflux symptoms. Patient denies red flag symptoms - no melena, weight loss, dysphagia. Will maintain current management.       Relevant Orders   CBC with Differential/Platelet   Major depression single episode, in partial remission (Chronic)   Depression and anxiety symptoms are stable and well controlled on Celexa . No SI/HI reported. I recommend continuing the same medical regimen.       Aortic atherosclerosis (Chronic)   LDL is  Lab Results  Component Value Date   LDLCALC 70 10/28/2022   Current medication regimen is atorvastatin . Goal LDL is < 70.       Relevant Orders   Comprehensive metabolic panel with GFR   Lipid panel   Ambulatory referral to Cardiology   Pulmonary hypertension (HCC)   Evaluated by Pulmonary in 2023.  Given Breo sample but also referred to Cardiology due to ECHO findings. She never went.  She has  albuterol  MDI but rarely uses it. Will give sample of Breztri. Refer to Cardiology.      Relevant Orders   Ambulatory referral to Cardiology   Hypothyroidism due to acquired atrophy of thyroid    Last TSH was elevated and T4 was low. She never returned for follow up labs. Will do those today.      Relevant Orders   TSH + free T4   Other Visit Diagnoses       Annual physical exam    -  Primary     Encounter for screening mammogram for breast cancer       last done 07/2022 at [facility] she will schedule at [facility]     Encounter for screening for osteoporosis       schedule DEXA at [facility]     Encounter for immunization       Relevant Orders   Flu vaccine HIGH DOSE PF(Fluzone Trivalent) (Completed)       Return in about 6 months (around 07/15/2024) for [facility]  Dr. Lemon.    Leita HILARIO Adie, MD [facility] [place]

## 2024-01-15 NOTE — Assessment & Plan Note (Signed)
 LDL is  Lab Results  Component Value Date   LDLCALC 70 10/28/2022   Current medication regimen is atorvastatin . Goal LDL is < 70.

## 2024-01-16 ENCOUNTER — Ambulatory Visit: Payer: Self-pay | Admitting: Internal Medicine

## 2024-01-16 DIAGNOSIS — H02883 Meibomian gland dysfunction of right eye, unspecified eyelid: Secondary | ICD-10-CM | POA: Diagnosis not present

## 2024-01-16 DIAGNOSIS — Z961 Presence of intraocular lens: Secondary | ICD-10-CM | POA: Diagnosis not present

## 2024-01-16 DIAGNOSIS — M3501 Sicca syndrome with keratoconjunctivitis: Secondary | ICD-10-CM | POA: Diagnosis not present

## 2024-01-16 DIAGNOSIS — H43813 Vitreous degeneration, bilateral: Secondary | ICD-10-CM | POA: Diagnosis not present

## 2024-01-16 LAB — CBC WITH DIFFERENTIAL/PLATELET
Basophils Absolute: 0 x10E3/uL (ref 0.0–0.2)
Basos: 1 %
EOS (ABSOLUTE): 0.1 x10E3/uL (ref 0.0–0.4)
Eos: 1 %
Hematocrit: 37.8 % (ref 34.0–46.6)
Hemoglobin: 11.9 g/dL (ref 11.1–15.9)
Immature Grans (Abs): 0 x10E3/uL (ref 0.0–0.1)
Immature Granulocytes: 0 %
Lymphocytes Absolute: 1.1 x10E3/uL (ref 0.7–3.1)
Lymphs: 25 %
MCH: 31.5 pg (ref 26.6–33.0)
MCHC: 31.5 g/dL (ref 31.5–35.7)
MCV: 100 fL — ABNORMAL HIGH (ref 79–97)
Monocytes Absolute: 0.4 x10E3/uL (ref 0.1–0.9)
Monocytes: 10 %
Neutrophils Absolute: 2.8 x10E3/uL (ref 1.4–7.0)
Neutrophils: 63 %
Platelets: 162 x10E3/uL (ref 150–450)
RBC: 3.78 x10E6/uL (ref 3.77–5.28)
RDW: 12.4 % (ref 11.7–15.4)
WBC: 4.4 x10E3/uL (ref 3.4–10.8)

## 2024-01-16 LAB — LIPID PANEL
Chol/HDL Ratio: 1.5 ratio (ref 0.0–4.4)
Cholesterol, Total: 134 mg/dL (ref 100–199)
HDL: 87 mg/dL (ref >39)
LDL Chol Calc (NIH): 28 mg/dL (ref 0–99)
Triglycerides: 108 mg/dL (ref 0–149)
VLDL Cholesterol Cal: 19 mg/dL (ref 5–40)

## 2024-01-16 LAB — COMPREHENSIVE METABOLIC PANEL WITH GFR
ALT: 17 IU/L (ref 0–32)
AST: 22 IU/L (ref 0–40)
Albumin: 4.4 g/dL (ref 3.8–4.8)
Alkaline Phosphatase: 82 IU/L (ref 49–135)
BUN/Creatinine Ratio: 15 (ref 12–28)
BUN: 13 mg/dL (ref 8–27)
Bilirubin Total: 0.7 mg/dL (ref 0.0–1.2)
CO2: 22 mmol/L (ref 20–29)
Calcium: 9.3 mg/dL (ref 8.7–10.3)
Chloride: 104 mmol/L (ref 96–106)
Creatinine, Ser: 0.86 mg/dL (ref 0.57–1.00)
Globulin, Total: 2.4 g/dL (ref 1.5–4.5)
Glucose: 90 mg/dL (ref 70–99)
Potassium: 4.9 mmol/L (ref 3.5–5.2)
Sodium: 142 mmol/L (ref 134–144)
Total Protein: 6.8 g/dL (ref 6.0–8.5)
eGFR: 71 mL/min/1.73 (ref >59)

## 2024-01-16 LAB — TSH+FREE T4
Free T4: 0.66 ng/dL — ABNORMAL LOW (ref 0.82–1.77)
TSH: 3.76 u[IU]/mL (ref 0.450–4.500)

## 2024-01-19 DIAGNOSIS — Z0100 Encounter for examination of eyes and vision without abnormal findings: Secondary | ICD-10-CM | POA: Diagnosis not present

## 2024-01-23 ENCOUNTER — Other Ambulatory Visit: Payer: Self-pay | Admitting: Internal Medicine

## 2024-01-23 DIAGNOSIS — F324 Major depressive disorder, single episode, in partial remission: Secondary | ICD-10-CM

## 2024-01-23 NOTE — Telephone Encounter (Signed)
 Copied from CRM [id number]. Topic: Clinical - Medication Refill >> Jan 23, 2024  8:40 AM Tiffini S wrote: Medication: citalopram  (CELEXA ) 20 MG tablet  Has the patient contacted their pharmacy? Yes (Agent: If no, request that the patient contact the pharmacy for the refill. If patient does not wish to contact the pharmacy document the reason why and proceed with request.) (Agent: If yes, when and what did the pharmacy advise?)  This is the patient's preferred pharmacy:  [facility] - [place], [place] - [address] [place] [address] Phone: [phone] Fax: [phone]    Is this the correct pharmacy for this prescription? Yes If no, delete pharmacy and type the correct one.   Has the prescription been filled recently? Yes  Is the patient out of the medication? Yes, took last tablet this morning   Has the patient been seen for an appointment in the last year OR does the patient have an upcoming appointment? Yes  Can we respond through MyChart? Yes  Agent: Please be advised that Rx refills may take up to 3 business days. We ask that you follow-up with your pharmacy.

## 2024-01-24 MED ORDER — CITALOPRAM HYDROBROMIDE 20 MG PO TABS: 20.0000 mg | ORAL_TABLET | Freq: Every day | ORAL | 0 refills | Status: AC

## 2024-01-24 NOTE — Telephone Encounter (Signed)
 Already refilled on 01/24/24 in a separate refill encounter.

## 2024-01-24 NOTE — Telephone Encounter (Signed)
 Requested Prescriptions  Pending Prescriptions Disp Refills   citalopram  (CELEXA ) 20 MG tablet 90 tablet 0    Sig: Take 1 tablet (20 mg total) by mouth daily.     Psychiatry:  Antidepressants - SSRI Passed - 01/24/2024 11:01 AM      Passed - Completed PHQ-2 or PHQ-9 in the last 360 days      Passed - Valid encounter within last 6 months    Recent Outpatient Visits           1 week ago Annual physical exam   [place] [facility] at [facility], Leita DEL, MD   6 months ago Grief reaction   [place] [facility] at [facility], Leita DEL, MD   7 months ago Pharyngitis, unspecified etiology   [place] [facility] at [facility], Leita DEL, MD   8 months ago Major depression single episode, in partial remission   [facility] at [facility], Leita DEL, MD       Future Appointments             In 3 weeks Gollan, Timothy J, MD [facility] at [facility]

## 2024-02-03 ENCOUNTER — Ambulatory Visit: Admitting: Cardiovascular Disease

## 2024-02-05 ENCOUNTER — Telehealth: Payer: Self-pay | Admitting: Internal Medicine

## 2024-02-05 NOTE — Telephone Encounter (Signed)
 Copied from CRM #[id number]. Topic: Medicare AWV >> Feb 05, 2024 11:16 AM Nathanel DEL wrote: Reason for CRM: Called 02/05/2024 to sched AWV - MAILBOX FULL  Nathanel Paschal; Care Guide Ambulatory Clinical Support [place] [facility] Direct Dial: [phone]

## 2024-02-20 ENCOUNTER — Ambulatory Visit: Admitting: Cardiovascular Disease

## 2024-03-30 ENCOUNTER — Other Ambulatory Visit: Payer: Self-pay | Admitting: Student

## 2024-03-30 DIAGNOSIS — K219 Gastro-esophageal reflux disease without esophagitis: Secondary | ICD-10-CM

## 2024-03-30 NOTE — Telephone Encounter (Unsigned)
 Copied from CRM [id number]. Topic: Clinical - Medication Refill >> Mar 30, 2024  3:14 PM Montie POUR wrote: Medication:  omeprazole  (PRILOSEC) 20 MG capsule  Has the patient contacted their pharmacy? Yes (Agent: If no, request that the patient contact the pharmacy for the refill. If patient does not wish to contact the pharmacy document the reason why and proceed with request.) (Agent: If yes, when and what did the pharmacy advise?) Pharmacy needs order to refill. Pharmacy does not show a refill for this medications.   This is the patient's preferred pharmacy:  [facility] - [place], [place] - [address] [place] [address] Phone: [phone] Fax: [phone]  Is this the correct pharmacy for this prescription? Yes If no, delete pharmacy and type the correct one.   Has the prescription been filled recently? No  Is the patient out of the medication? Yes Out as of today  Has the patient been seen for an appointment in the last year OR does the patient have an upcoming appointment? Yes  Can we respond through MyChart? No  Agent: Please be advised that Rx refills may take up to 3 business days. We ask that you follow-up with your pharmacy.

## 2024-03-31 ENCOUNTER — Telehealth: Payer: Self-pay

## 2024-03-31 MED ORDER — OMEPRAZOLE 20 MG PO CPDR: 20.0000 mg | DELAYED_RELEASE_CAPSULE | Freq: Every day | ORAL | 0 refills | Status: AC

## 2024-03-31 NOTE — Telephone Encounter (Signed)
 Copied from CRM #[id number]. Topic: Clinical - Prescription Issue >> Mar 31, 2024 10:21 AM Travis F wrote: Reason for CRM: Patient is calling in because she wants to know an update on her refill for omeprazole  (PRILOSEC) 20 MG capsule [[id number]]. Informed patient medication refills can take up to three business days. Patient says she is completely out of the medication and needs it.

## 2024-03-31 NOTE — Telephone Encounter (Signed)
 Refill sent ? ?KP ?

## 2024-04-09 ENCOUNTER — Other Ambulatory Visit: Payer: Self-pay | Admitting: Student

## 2024-04-09 DIAGNOSIS — I700 Atherosclerosis of aorta: Secondary | ICD-10-CM

## 2024-04-09 NOTE — Telephone Encounter (Signed)
 Copied from CRM #[id number]. Topic: Clinical - Medication Refill >> Apr 09, 2024  8:45 AM Myrick T wrote:  Patient took her last dosage today  Medication: atorvastatin  (LIPITOR) 10 MG tablet  Has the patient contacted their pharmacy? No  This is the patient's preferred pharmacy:  [facility] - [place], [place] - [address] [place] [address] Phone: [phone] Fax: [phone]  Is this the correct pharmacy for this prescription? Yes  Has the prescription been filled recently? Yes  Is the patient out of the medication? Yes  Has the patient been seen for an appointment in the last year OR does the patient have an upcoming appointment? Yes  Can we respond through MyChart? Yes  Agent: Please be advised that Rx refills may take up to 3 business days. We ask that you follow-up with your pharmacy.

## 2024-04-12 MED ORDER — ATORVASTATIN CALCIUM 10 MG PO TABS: 10.0000 mg | ORAL_TABLET | Freq: Every day | ORAL | 1 refills | Status: AC

## 2024-04-12 NOTE — Telephone Encounter (Signed)
 Requested Prescriptions  Pending Prescriptions Disp Refills   atorvastatin  (LIPITOR) 10 MG tablet 100 tablet 1    Sig: Take 1 tablet (10 mg total) by mouth daily.     Cardiovascular:  Antilipid - Statins Failed - 04/12/2024 10:14 AM      Failed - Lipid Panel in normal range within the last 12 months    Cholesterol, Total  Date Value Ref Range Status  01/15/2024 134 100 - 199 mg/dL Final   LDL Chol Calc (NIH)  Date Value Ref Range Status  01/15/2024 28 0 - 99 mg/dL Final   HDL  Date Value Ref Range Status  01/15/2024 87 >39 mg/dL Final   Triglycerides  Date Value Ref Range Status  01/15/2024 108 0 - 149 mg/dL Final         Passed - Patient is not pregnant      Passed - Valid encounter within last 12 months    Recent Outpatient Visits           2 months ago Annual physical exam   [place] [facility] at [facility], Leita DEL, MD   9 months ago Grief reaction   [place] [facility] at [facility], Leita DEL, MD   10 months ago Pharyngitis, unspecified etiology   [place] [facility] at [facility], Leita DEL, MD   11 months ago Major depression single episode, in partial remission   [facility] at [facility], Leita DEL, MD

## 2024-04-22 ENCOUNTER — Encounter: Payer: Self-pay | Admitting: Family Medicine

## 2024-04-22 ENCOUNTER — Ambulatory Visit: Admitting: Family Medicine

## 2024-04-22 ENCOUNTER — Ambulatory Visit: Payer: Self-pay

## 2024-04-22 VITALS — BP 120/78 | HR 71 | Temp 98.2°F | Ht 66.0 in | Wt 151.0 lb

## 2024-04-22 DIAGNOSIS — J029 Acute pharyngitis, unspecified: Secondary | ICD-10-CM

## 2024-04-22 LAB — POCT RAPID STREP A: Rapid Strep A Screen: NEGATIVE

## 2024-04-22 MED ORDER — DOXYCYCLINE HYCLATE 100 MG PO TABS: 100.0000 mg | ORAL_TABLET | Freq: Two times a day (BID) | ORAL | 0 refills | Status: AC

## 2024-04-22 NOTE — Telephone Encounter (Signed)
 FYI Only or Action Required?: FYI only for provider: appointment scheduled on 04/22/24.  Patient was last seen in primary care on 01/15/2024 by Justus Leita DEL, MD.  Called Nurse Triage reporting Sore Throat.  Symptoms began yesterday.  Interventions attempted: OTC medications: Ibuprofen .  Symptoms are: stable.  Triage Disposition: See Physician Within 24 Hours  Patient/caregiver understands and will follow disposition?: Yes    Reason for Disposition  Earache also present  Answer Assessment - Initial Assessment Questions Pt has taken a few Ibuprofen .  1. ONSET: When did the throat start hurting? (Hours or days ago)      Yesterday  2. SEVERITY: How bad is the sore throat? (Scale 1-10; mild, moderate or severe)     Moderate, mostly hurts on the left side  3.  VIRAL SYMPTOMS: Are there any symptoms of a cold, such as a runny nose, cough, hoarse voice or red eyes?      Denies  4. FEVER: Do you have a fever? If Yes, ask: What is your temperature, how was it measured, and when did it start?     Has not checked, does not feel like she has one  5. OTHER SYMPTOMS: Do you have any other symptoms? (e.g., difficulty breathing, headache, rash)     Left ear pain  Protocols used: Sore Throat-A-AH  Message from Day E sent at 04/22/2024 11:24 AM EST  Reason for Triage: Difficulty swallowing, throat is swollen. Severe left ear pain, right ear is starting to bother her as well.

## 2024-04-22 NOTE — Progress Notes (Signed)
" ° °  Acute Office Visit  Subjective:     Patient ID: Claudia Gutierrez, female    DOB: 02/04/1951, 74 y.o.   MRN: [id number]  Chief Complaint  Patient presents with   Sore Throat    Left side of the throat, hard to swallow, started yesterday morning, taking ibuprofen , left ear pain    Discussed the use of AI scribe software for clinical note transcription with the patient, who gave verbal consent to proceed.  History of Present Illness Claudia Gutierrez is a 74 year old female who presents with a sore throat and difficulty swallowing.  She has been experiencing a sore throat and difficulty swallowing, primarily on the left side, since yesterday. The pain has been increasing, and she describes a lack of energy. No fever or body aches, but she reports some chills and feeling very hot at times.  She has taken ibuprofen  today, which made her feel sleepy. No other symptoms such as bringing up anything from her throat, but her nose has been a little runny today.  She lives with her fianc, who is not sick, and no one else at home is ill.     Review of Systems  All other systems reviewed and are negative.       Objective:    BP 120/78   Pulse 71   Temp 98.2 F (36.8 C) (Oral)   Ht 5' 6 (1.676 m)   Wt 151 lb (68.5 kg)   SpO2 95%   BMI 24.37 kg/m     Physical Exam Vitals and nursing note reviewed.  Constitutional:      Appearance: Normal appearance.  HENT:     Head: Normocephalic.     Right Ear: External ear normal.     Left Ear: External ear normal.     Mouth/Throat:     Pharynx: Posterior oropharyngeal erythema present. No pharyngeal swelling.  Eyes:     Conjunctiva/sclera: Conjunctivae normal.  Cardiovascular:     Rate and Rhythm: Normal rate.  Pulmonary:     Effort: Pulmonary effort is normal. No respiratory distress.  Abdominal:     Palpations: Abdomen is soft.  Musculoskeletal:        General: Normal range of motion.  Skin:    General: Skin is warm.   Neurological:     Mental Status: She is alert and oriented to person, place, and time.  Psychiatric:        Mood and Affect: Mood normal.     No results found for any visits on 04/22/24.      Assessment & Plan:   Assessment & Plan Sore throat  Orders:   doxycycline  (VIBRA -TABS) 100 MG tablet; Take 1 tablet (100 mg total) by mouth 2 (two) times daily for 7 days.   POCT Rapid Strep A   Assessment and Plan Assessment & Plan Acute pharyngitis Sore throat with difficulty swallowing and left-sided pain. Differential includes viral infections. Swabbed throat for strep test. - Advised salt water gargles and OTC throat sprays. - Recommended tea with lemon, honey, and ginger. - Suggested vitamin C supplementation. - Advised Tylenol  or ibuprofen  for pain. - Instructed to monitor for fever, chills, shortness of breath, or chest pain. - Discussed potential for antibiotics if symptoms worsen.     No follow-ups on file.  Danella Philson K Anieya Helman, MD  "

## 2024-07-15 ENCOUNTER — Encounter: Admitting: Family Medicine

## 2024-07-15 ENCOUNTER — Encounter: Admitting: Student
